# Patient Record
Sex: Male | Born: 1960 | Race: White | Hispanic: No | Marital: Single | Smoking: Former smoker
Health system: Southern US, Community
[De-identification: ages and names within clinical notes are randomized; demographics above are authoritative.]

## PROBLEM LIST (undated history)

## (undated) DIAGNOSIS — R768 Other specified abnormal immunological findings in serum: Secondary | ICD-10-CM

## (undated) DIAGNOSIS — K5900 Constipation, unspecified: Secondary | ICD-10-CM

## (undated) DIAGNOSIS — I10 Essential (primary) hypertension: Secondary | ICD-10-CM

## (undated) DIAGNOSIS — Z87442 Personal history of urinary calculi: Secondary | ICD-10-CM

## (undated) DIAGNOSIS — K219 Gastro-esophageal reflux disease without esophagitis: Secondary | ICD-10-CM

## (undated) DIAGNOSIS — F209 Schizophrenia, unspecified: Secondary | ICD-10-CM

## (undated) HISTORY — DX: Other specified abnormal immunological findings in serum: R76.8

## (undated) HISTORY — PX: CHOLECYSTECTOMY: SHX55

## (undated) HISTORY — PX: KIDNEY STONE SURGERY: SHX686

---

## 2014-03-12 LAB — CBC
HCT: 46.1 % (ref 40.0–52.0)
HGB: 15.6 g/dL (ref 13.0–18.0)
MCH: 34 pg (ref 26.0–34.0)
MCHC: 33.8 g/dL (ref 32.0–36.0)
MCV: 101 fL — AB (ref 80–100)
Platelet: 220 10*3/uL (ref 150–440)
RBC: 4.59 10*6/uL (ref 4.40–5.90)
RDW: 12.6 % (ref 11.5–14.5)
WBC: 9.1 10*3/uL (ref 3.8–10.6)

## 2014-03-12 LAB — ETHANOL: Ethanol %: <0.003 % (ref 0.000–0.080)

## 2014-03-12 LAB — COMPREHENSIVE METABOLIC PANEL
ALBUMIN: 4.5 g/dL (ref 3.4–5.0)
ALK PHOS: 100 U/L
Anion Gap: 10 (ref 7–16)
BILIRUBIN TOTAL: 0.6 mg/dL (ref 0.2–1.0)
BUN: 11 mg/dL (ref 7–18)
CREATININE: 1.2 mg/dL (ref 0.60–1.30)
Calcium, Total: 9.5 mg/dL (ref 8.5–10.1)
Chloride: 106 mmol/L (ref 98–107)
Co2: 24 mmol/L (ref 21–32)
EGFR (Non-African Amer.): >60
GLUCOSE: 111 mg/dL — AB (ref 65–99)
OSMOLALITY: 279 (ref 275–301)
POTASSIUM: 4.1 mmol/L (ref 3.5–5.1)
SGOT(AST): 17 U/L (ref 15–37)
SGPT (ALT): 19 U/L (ref 12–78)
SODIUM: 140 mmol/L (ref 136–145)
Total Protein: 8.6 g/dL — ABNORMAL HIGH (ref 6.4–8.2)

## 2014-03-12 LAB — ACETAMINOPHEN LEVEL: Acetaminophen: <2 ug/mL

## 2014-03-12 LAB — SALICYLATE LEVEL: Salicylates, Serum: <1.7 mg/dL

## 2014-03-13 ENCOUNTER — Inpatient Hospital Stay: Payer: Self-pay | Admitting: Psychiatry

## 2014-03-13 LAB — DRUG SCREEN, URINE

## 2014-03-13 LAB — URINALYSIS, COMPLETE
BLOOD: NEGATIVE
Bilirubin,UR: NEGATIVE
GLUCOSE, UR: NEGATIVE mg/dL (ref 0–75)
LEUKOCYTE ESTERASE: NEGATIVE
NITRITE: NEGATIVE
Ph: 5 (ref 4.5–8.0)
Protein: NEGATIVE
RBC,UR: 7 /HPF (ref 0–5)
SPECIFIC GRAVITY: 1.027 (ref 1.003–1.030)
Squamous Epithelial: NONE SEEN
WBC UR: 4 /HPF (ref 0–5)

## 2014-08-07 LAB — DRUG SCREEN, URINE
Amphetamines, Ur Screen: NEGATIVE (ref ?–1000)
BARBITURATES, UR SCREEN: NEGATIVE (ref ?–200)
Benzodiazepine, Ur Scrn: NEGATIVE (ref ?–200)
Cannabinoid 50 Ng, Ur ~~LOC~~: NEGATIVE (ref ?–50)
Cocaine Metabolite,Ur ~~LOC~~: NEGATIVE (ref ?–300)
MDMA (ECSTASY) UR SCREEN: NEGATIVE (ref ?–500)
Methadone, Ur Screen: NEGATIVE (ref ?–300)
OPIATE, UR SCREEN: NEGATIVE (ref ?–300)
PHENCYCLIDINE (PCP) UR S: NEGATIVE (ref ?–25)
TRICYCLIC, UR SCREEN: NEGATIVE (ref ?–1000)

## 2014-08-07 LAB — URINALYSIS, COMPLETE
BACTERIA: NONE SEEN
BILIRUBIN, UR: NEGATIVE
Glucose,UR: NEGATIVE mg/dL (ref 0–75)
KETONE: NEGATIVE
LEUKOCYTE ESTERASE: NEGATIVE
NITRITE: NEGATIVE
PROTEIN: NEGATIVE
Ph: 5 (ref 4.5–8.0)
RBC,UR: 2 /HPF (ref 0–5)
SQUAMOUS EPITHELIAL: NONE SEEN
Specific Gravity: 1.012 (ref 1.003–1.030)
WBC UR: 1 /HPF (ref 0–5)

## 2014-08-07 LAB — CBC
HCT: 40 % (ref 40.0–52.0)
HGB: 13.3 g/dL (ref 13.0–18.0)
MCH: 34.2 pg — ABNORMAL HIGH (ref 26.0–34.0)
MCHC: 33.2 g/dL (ref 32.0–36.0)
MCV: 103 fL — AB (ref 80–100)
PLATELETS: 193 10*3/uL (ref 150–440)
RBC: 3.88 10*6/uL — ABNORMAL LOW (ref 4.40–5.90)
RDW: 12.7 % (ref 11.5–14.5)
WBC: 7.9 10*3/uL (ref 3.8–10.6)

## 2014-08-07 LAB — COMPREHENSIVE METABOLIC PANEL
ALT: 24 U/L
Albumin: 3.9 g/dL (ref 3.4–5.0)
Alkaline Phosphatase: 126 U/L — ABNORMAL HIGH
Anion Gap: 7 (ref 7–16)
BILIRUBIN TOTAL: 0.3 mg/dL (ref 0.2–1.0)
BUN: 7 mg/dL (ref 7–18)
CALCIUM: 8.5 mg/dL (ref 8.5–10.1)
CO2: 29 mmol/L (ref 21–32)
Chloride: 104 mmol/L (ref 98–107)
Creatinine: 0.93 mg/dL (ref 0.60–1.30)
EGFR (Non-African Amer.): >60
Glucose: 106 mg/dL — ABNORMAL HIGH (ref 65–99)
Osmolality: 278 (ref 275–301)
POTASSIUM: 3.5 mmol/L (ref 3.5–5.1)
SGOT(AST): 21 U/L (ref 15–37)
Sodium: 140 mmol/L (ref 136–145)
TOTAL PROTEIN: 7.7 g/dL (ref 6.4–8.2)

## 2014-08-07 LAB — ACETAMINOPHEN LEVEL

## 2014-08-07 LAB — ETHANOL: Ethanol: <3 mg/dL

## 2014-08-07 LAB — SALICYLATE LEVEL: Salicylates, Serum: <1.7 mg/dL

## 2014-08-10 ENCOUNTER — Inpatient Hospital Stay: Payer: Self-pay | Admitting: Psychiatry

## 2014-08-10 LAB — CBC WITH DIFFERENTIAL/PLATELET
Basophil #: 0 10*3/uL (ref 0.0–0.1)
Basophil %: 0.5 %
EOS ABS: 0.3 10*3/uL (ref 0.0–0.7)
EOS PCT: 3 %
HCT: 37.2 % — ABNORMAL LOW (ref 40.0–52.0)
HGB: 12.6 g/dL — AB (ref 13.0–18.0)
LYMPHS PCT: 29.5 %
Lymphocyte #: 2.5 10*3/uL (ref 1.0–3.6)
MCH: 34.4 pg — ABNORMAL HIGH (ref 26.0–34.0)
MCHC: 33.8 g/dL (ref 32.0–36.0)
MCV: 102 fL — ABNORMAL HIGH (ref 80–100)
Monocyte #: 0.9 x10 3/mm (ref 0.2–1.0)
Monocyte %: 10.2 %
NEUTROS ABS: 4.9 10*3/uL (ref 1.4–6.5)
NEUTROS PCT: 56.8 %
Platelet: 174 10*3/uL (ref 150–440)
RBC: 3.65 10*6/uL — ABNORMAL LOW (ref 4.40–5.90)
RDW: 12.7 % (ref 11.5–14.5)
WBC: 8.6 10*3/uL (ref 3.8–10.6)

## 2014-08-10 LAB — LIPID PANEL
Cholesterol: 166 mg/dL
HDL Cholesterol: 35 mg/dL — ABNORMAL LOW
Ldl Cholesterol, Calc: 75 mg/dL
Triglycerides: 278 mg/dL — ABNORMAL HIGH
VLDL Cholesterol, Calc: 56 mg/dL — ABNORMAL HIGH

## 2014-08-10 LAB — HEMOGLOBIN A1C: HEMOGLOBIN A1C: 5.2 % (ref 4.2–6.3)

## 2014-08-10 LAB — TSH: Thyroid Stimulating Horm: 1.93 u[IU]/mL

## 2014-08-17 LAB — CBC WITH DIFFERENTIAL/PLATELET
Basophil #: 0.1 10*3/uL (ref 0.0–0.1)
Basophil %: 0.6 %
EOS ABS: 0 10*3/uL (ref 0.0–0.7)
Eosinophil %: 0.3 %
HCT: 40.9 % (ref 40.0–52.0)
HGB: 13.6 g/dL (ref 13.0–18.0)
LYMPHS PCT: 12.8 %
Lymphocyte #: 1.2 10*3/uL (ref 1.0–3.6)
MCH: 33.9 pg (ref 26.0–34.0)
MCHC: 33.2 g/dL (ref 32.0–36.0)
MCV: 102 fL — AB (ref 80–100)
MONOS PCT: 13.6 %
Monocyte #: 1.2 x10 3/mm — ABNORMAL HIGH (ref 0.2–1.0)
Neutrophil #: 6.6 10*3/uL — ABNORMAL HIGH (ref 1.4–6.5)
Neutrophil %: 72.7 %
Platelet: 206 10*3/uL (ref 150–440)
RBC: 4.01 10*6/uL — AB (ref 4.40–5.90)
RDW: 12.7 % (ref 11.5–14.5)
WBC: 9.1 10*3/uL (ref 3.8–10.6)

## 2014-08-24 LAB — DIFFERENTIAL
Basophil #: 0 10*3/uL (ref 0.0–0.1)
Basophil %: 0.4 %
Eosinophil #: 0 10*3/uL (ref 0.0–0.7)
Eosinophil %: 0.1 %
LYMPHS ABS: 1.1 10*3/uL (ref 1.0–3.6)
LYMPHS PCT: 10.1 %
MONOS PCT: 6.9 %
Monocyte #: 0.8 x10 3/mm (ref 0.2–1.0)
Neutrophil #: 9.4 10*3/uL — ABNORMAL HIGH (ref 1.4–6.5)
Neutrophil %: 82.5 %

## 2014-08-24 LAB — WBC: WBC: 11.4 10*3/uL — AB (ref 3.8–10.6)

## 2014-08-30 LAB — DIFFERENTIAL
BASOS ABS: 0 10*3/uL (ref 0.0–0.1)
BASOS PCT: 0.6 %
EOS ABS: 0.1 10*3/uL (ref 0.0–0.7)
Eosinophil %: 1.8 %
LYMPHS ABS: 2.2 10*3/uL (ref 1.0–3.6)
Lymphocyte %: 27.2 %
MONO ABS: 0.8 x10 3/mm (ref 0.2–1.0)
MONOS PCT: 10 %
NEUTROS PCT: 60.4 %
Neutrophil #: 4.9 10*3/uL (ref 1.4–6.5)

## 2014-08-30 LAB — WBC: WBC: 8.1 10*3/uL (ref 3.8–10.6)

## 2014-10-07 ENCOUNTER — Encounter (HOSPITAL_COMMUNITY): Payer: Self-pay | Admitting: *Deleted

## 2014-10-07 ENCOUNTER — Emergency Department (HOSPITAL_COMMUNITY)
Admission: EM | Admit: 2014-10-07 | Discharge: 2014-10-08 | Disposition: A | Discharge status: Home / Self Care | Payer: Medicaid Other | Attending: Emergency Medicine | Admitting: Emergency Medicine

## 2014-10-07 DIAGNOSIS — F209 Schizophrenia, unspecified: Secondary | ICD-10-CM | POA: Insufficient documentation

## 2014-10-07 DIAGNOSIS — Z9049 Acquired absence of other specified parts of digestive tract: Secondary | ICD-10-CM | POA: Diagnosis not present

## 2014-10-07 DIAGNOSIS — R112 Nausea with vomiting, unspecified: Secondary | ICD-10-CM | POA: Diagnosis present

## 2014-10-07 DIAGNOSIS — Z720 Tobacco use: Secondary | ICD-10-CM | POA: Diagnosis not present

## 2014-10-07 DIAGNOSIS — R1013 Epigastric pain: Secondary | ICD-10-CM

## 2014-10-07 DIAGNOSIS — E876 Hypokalemia: Secondary | ICD-10-CM

## 2014-10-07 DIAGNOSIS — E860 Dehydration: Secondary | ICD-10-CM

## 2014-10-07 DIAGNOSIS — Z79899 Other long term (current) drug therapy: Secondary | ICD-10-CM | POA: Insufficient documentation

## 2014-10-07 DIAGNOSIS — I951 Orthostatic hypotension: Secondary | ICD-10-CM

## 2014-10-07 HISTORY — DX: Schizophrenia, unspecified: F20.9

## 2014-10-07 LAB — COMPREHENSIVE METABOLIC PANEL
ALK PHOS: 106 U/L (ref 39–117)
ALT: 12 U/L (ref 0–53)
AST: 18 U/L (ref 0–37)
Albumin: 3.2 g/dL — ABNORMAL LOW (ref 3.5–5.2)
Anion gap: 5 (ref 5–15)
BUN: 10 mg/dL (ref 6–23)
CHLORIDE: 104 mmol/L (ref 96–112)
CO2: 29 mmol/L (ref 19–32)
Calcium: 8.5 mg/dL (ref 8.4–10.5)
Creatinine, Ser: 0.88 mg/dL (ref 0.50–1.35)
GFR calc Af Amer: >90 mL/min (ref >90)
GFR calc non Af Amer: >90 mL/min (ref >90)
GLUCOSE: 122 mg/dL — AB (ref 70–99)
Potassium: 3 mmol/L — ABNORMAL LOW (ref 3.5–5.1)
Sodium: 138 mmol/L (ref 135–145)
Total Bilirubin: 0.5 mg/dL (ref 0.3–1.2)
Total Protein: 6.6 g/dL (ref 6.0–8.3)

## 2014-10-07 LAB — URINE MICROSCOPIC-ADD ON

## 2014-10-07 LAB — CBC WITH DIFFERENTIAL/PLATELET
Basophils Absolute: 0 10*3/uL (ref 0.0–0.1)
Basophils Relative: 0 % (ref 0–1)
EOS ABS: 0.1 10*3/uL (ref 0.0–0.7)
Eosinophils Relative: 2 % (ref 0–5)
HCT: 37.3 % — ABNORMAL LOW (ref 39.0–52.0)
Hemoglobin: 12.5 g/dL — ABNORMAL LOW (ref 13.0–17.0)
LYMPHS ABS: 2 10*3/uL (ref 0.7–4.0)
Lymphocytes Relative: 26 % (ref 12–46)
MCH: 32.5 pg (ref 26.0–34.0)
MCHC: 33.5 g/dL (ref 30.0–36.0)
MCV: 96.9 fL (ref 78.0–100.0)
MONO ABS: 0.6 10*3/uL (ref 0.1–1.0)
Monocytes Relative: 7 % (ref 3–12)
NEUTROS PCT: 65 % (ref 43–77)
Neutro Abs: 5 10*3/uL (ref 1.7–7.7)
PLATELETS: 233 10*3/uL (ref 150–400)
RBC: 3.85 MIL/uL — ABNORMAL LOW (ref 4.22–5.81)
RDW: 11.9 % (ref 11.5–15.5)
WBC: 7.7 10*3/uL (ref 4.0–10.5)

## 2014-10-07 LAB — URINALYSIS, ROUTINE W REFLEX MICROSCOPIC
Bilirubin Urine: NEGATIVE
Glucose, UA: NEGATIVE mg/dL
Ketones, ur: NEGATIVE mg/dL
Leukocytes, UA: NEGATIVE
Nitrite: NEGATIVE
Protein, ur: NEGATIVE mg/dL
SPECIFIC GRAVITY, URINE: 1.02 (ref 1.005–1.030)
Urobilinogen, UA: 0.2 mg/dL (ref 0.0–1.0)
pH: 5.5 (ref 5.0–8.0)

## 2014-10-07 LAB — LIPASE, BLOOD: Lipase: 33 U/L (ref 11–59)

## 2014-10-07 MED ORDER — SODIUM CHLORIDE 0.9 % IV SOLN
1000.0000 mL | Freq: Once | INTRAVENOUS | Status: AC
  Administered 2014-10-07: 1000 mL via INTRAVENOUS

## 2014-10-07 MED ORDER — POTASSIUM CHLORIDE CRYS ER 20 MEQ PO TBCR
40.0000 meq | EXTENDED_RELEASE_TABLET | Freq: Once | ORAL | Status: AC
  Administered 2014-10-08: 40 meq via ORAL
  Filled 2014-10-07: qty 2

## 2014-10-07 MED ORDER — GI COCKTAIL ~~LOC~~
30.0000 mL | Freq: Once | ORAL | Status: AC
  Administered 2014-10-08: 30 mL via ORAL
  Filled 2014-10-07: qty 30

## 2014-10-07 MED ORDER — FAMOTIDINE 20 MG PO TABS
20.0000 mg | ORAL_TABLET | Freq: Once | ORAL | Status: AC
  Administered 2014-10-08: 20 mg via ORAL
  Filled 2014-10-07: qty 1

## 2014-10-07 NOTE — ED Provider Notes (Signed)
CSN: [id number]     Arrival date & time 10/07/14  1902 History  This chart was scribed for Danny Norrie, MD by [name], ED Scribe. This patient was seen in room APA12/APA12 and the patient's care was started at 11:31 PM.    Chief Complaint  Patient presents with  . Nausea   The history is provided by the patient, a caregiver and medical records. No language interpreter was used.    HPI Comments: Danny Greene is a 54 y.o. male resident at [facility] group home with history of schizophrenia who presents to the Emergency Department complaining of decreased appetite with onset 3 weeks ago, per staff member at group home. He reports associated weights loss of 15 pounds in the past month, nausea, vomiting a few times a week, and difficulty walking. He states he has not been eating due to nausea. He reports a burning abdominal pain, which he is experiencing mildly at this time and it is located in the epigastric area. He states he has to get used to being on his feet because he gets lightheaded and his knees start buckling. He states he is able to walk without a cane. The group home doctor saw him today and referred him here. He states he has had similar symptoms previously with decreased appetite and nausea and is unsure what prior diagnosis was. He denies recent hallucinations and states Clozaril is working well but thinks he is taking too much. He states he recently had increase in dose. He notes he talks to himself "all the time" but states he tries to keep it low so as not to bother others. Records indicate he began using Metoprolol 1 month ago for tachycardia but it was DC'd today. He states he smokes 1 pack or less every day. He denies diarrhea.  PCP PA Lauretta Grill  Past Medical History  Diagnosis Date  . Schizophrenia    Past Surgical History  Procedure Laterality Date  . Cholecystectomy     History reviewed. No pertinent family history. History  Substance Use Topics  . Smoking  status: Current Every Day Smoker  . Smokeless tobacco: Not on file  . Alcohol Use: No  lives in a group home  Living in this group home for about 6 months  Review of Systems  Constitutional: Positive for appetite change and unexpected weight change.  Gastrointestinal: Positive for nausea, vomiting and abdominal pain. Negative for diarrhea.  Neurological: Positive for weakness and light-headedness.  All other systems reviewed and are negative.     Allergies  Review of patient's allergies indicates no known allergies.  Home Medications   Prior to Admission medications   Medication Sig Start Date End Date Taking? Authorizing Provider  cloZAPine (CLOZARIL) 100 MG tablet Take 100 mg by mouth daily.   Yes Historical Provider, MD  clozapine (CLOZARIL) 200 MG tablet Take 400 mg by mouth at bedtime.   Yes Historical Provider, MD  senna (SENOKOT) 8.6 MG TABS tablet Take 2 tablets by mouth at bedtime.   Yes Historical Provider, MD   ED Triage Vitals  Enc Vitals Group     BP 10/07/14 1907 102/75 mmHg     Pulse Rate 10/07/14 1907 90     Resp 10/07/14 1907 18     Temp 10/07/14 1907 98.9 F (37.2 C)     Temp Source 10/07/14 1907 Oral     SpO2 10/07/14 1907 99 %     Weight 10/07/14 1907 153 lb 11.2 oz (  69.718 kg)     Height 10/07/14 1907 5\' 11"  (1.803 m)     Head Cir --      Peak Flow --      Pain Score --      Pain Loc --      Pain Edu? --      Excl. in [place]? --    Vital signs normal    Physical Exam  Constitutional: He is oriented to person, place, and time. He appears well-developed and well-nourished.  Non-toxic appearance. He does not appear ill. No distress.  Pt has a beard and long hair Thin Laying on his side as if uncomfortable  HENT:  Head: Normocephalic and atraumatic.  Right Ear: External ear normal.  Left Ear: External ear normal.  Nose: Nose normal. No mucosal edema or rhinorrhea.  Mouth/Throat: Oropharynx is clear and moist and mucous membranes are normal. No  dental abscesses or uvula swelling.  Eyes: Conjunctivae and EOM are normal. Pupils are equal, round, and reactive to light.  Neck: Normal range of motion and full passive range of motion without pain. Neck supple.  Cardiovascular: Normal rate, regular rhythm and normal heart sounds.  Exam reveals no gallop and no friction rub.   No murmur heard. Pulmonary/Chest: Effort normal and breath sounds normal. No respiratory distress. He has no wheezes. He has no rhonchi. He has no rales. He exhibits no tenderness and no crepitus.  Abdominal: Soft. Normal appearance and bowel sounds are normal. He exhibits no distension. There is tenderness (mild, epigastric tenderness). There is no rebound and no guarding.    Musculoskeletal: Normal range of motion. He exhibits no edema or tenderness.  Moves all extremities well.   Neurological: He is alert and oriented to person, place, and time. He has normal strength. No cranial nerve deficit.  Skin: Skin is warm, dry and intact. No rash noted. No erythema. No pallor.  Psychiatric: He has a normal mood and affect. His speech is normal and behavior is normal. His mood appears not anxious.  Nursing note and vitals reviewed.   ED Course  Procedures (including critical care time)  Medications  0.9 %  sodium chloride infusion (0 mLs Intravenous Stopped 10/08/14 0019)  famotidine (PEPCID) tablet 20 mg (20 mg Oral Given 10/08/14 0017)  gi cocktail (Maalox,Lidocaine,Donnatal) (30 mLs Oral Given 10/08/14 0017)  potassium chloride SA (K-DUR,KLOR-CON) CR tablet 40 mEq (40 mEq Oral Given 10/08/14 0017)  sodium chloride 0.9 % bolus 1,000 mL (0 mLs Intravenous Stopped 10/08/14 0155)  sodium chloride 0.9 % bolus 1,000 mL (1,000 mLs Intravenous New Bag/Given 10/08/14 0154)     DIAGNOSTIC STUDIES: Oxygen Saturation is 100% on room air, normal by my interpretation.    COORDINATION OF CARE: 11:41 PM Discussed treatment plan with patient at beside, the patient agrees with the plan and  has no further questions at this time. Patient was started on potassium supplementation.  23:59 Orthostatic Vital Signs LB  Orthostatic Lying  - BP- Lying: 113/77 mmHg ; Pulse- Lying: 76  Orthostatic Sitting - BP- Sitting: 110/75 mmHg ; Pulse- Sitting: 86  Orthostatic Standing at 0 minutes - BP- Standing at 0 minutes: 90/63 mmHg ; Pulse- Standing at 0 minutes: 94   Patient has significant drop of his blood pressure to 90 on standing. This may explain his feeling of lightheadedness when he stands up. He was given 1 L of IV fluids.  01:31:26 Orthostatic Vital Signs RH  Orthostatic Lying  - BP- Lying: 109/84 mmHg ; Pulse-  Lying: 90  Orthostatic Sitting - BP- Sitting: 99/76 mmHg ; Pulse- Sitting: 91  Orthostatic Standing at 0 minutes - BP- Standing at 0 minutes: 88/57 mmHg ; Pulse- Standing at 0 minutes: 90   Repeat orthostatics done after his first liter of IV fluid shows he still has significant orthostasis. He was given a second liter of IV fluid.  At time of discharge and went to see the patient. He has had some urinary output. He states he's feeling better he still has some mild epigastric discomfort. Patient is adamant however he is not going to have a CT scan done tonight. Unfortunately there is no reason to do the scan against his will because the patient has capacity to make that decision tonight. He is not actively hallucinating. He is alert and oriented.   Labs Review Results for orders placed or performed during the hospital encounter of 10/07/14  CBC with Differential  Result Value Ref Range   WBC 7.7 4.0 - 10.5 K/uL   RBC 3.85 (L) 4.22 - 5.81 MIL/uL   Hemoglobin 12.5 (L) 13.0 - 17.0 g/dL   HCT 37.3 (L) 39.0 - 52.0 %   MCV 96.9 78.0 - 100.0 fL   MCH 32.5 26.0 - 34.0 pg   MCHC 33.5 30.0 - 36.0 g/dL   RDW 11.9 11.5 - 15.5 %   Platelets 233 150 - 400 K/uL   Neutrophils Relative % 65 43 - 77 %   Neutro Abs 5.0 1.7 - 7.7 K/uL   Lymphocytes Relative 26 12 - 46 %   Lymphs Abs 2.0  0.7 - 4.0 K/uL   Monocytes Relative 7 3 - 12 %   Monocytes Absolute 0.6 0.1 - 1.0 K/uL   Eosinophils Relative 2 0 - 5 %   Eosinophils Absolute 0.1 0.0 - 0.7 K/uL   Basophils Relative 0 0 - 1 %   Basophils Absolute 0.0 0.0 - 0.1 K/uL  Comprehensive metabolic panel  Result Value Ref Range   Sodium 138 135 - 145 mmol/L   Potassium 3.0 (L) 3.5 - 5.1 mmol/L   Chloride 104 96 - 112 mmol/L   CO2 29 19 - 32 mmol/L   Glucose, Bld 122 (H) 70 - 99 mg/dL   BUN 10 6 - 23 mg/dL   Creatinine, Ser 0.88 0.50 - 1.35 mg/dL   Calcium 8.5 8.4 - 10.5 mg/dL   Total Protein 6.6 6.0 - 8.3 g/dL   Albumin 3.2 (L) 3.5 - 5.2 g/dL   AST 18 0 - 37 U/L   ALT 12 0 - 53 U/L   Alkaline Phosphatase 106 39 - 117 U/L   Total Bilirubin 0.5 0.3 - 1.2 mg/dL   GFR calc non Af Amer >90 >90 mL/min   GFR calc Af Amer >90 >90 mL/min   Anion gap 5 5 - 15  Lipase, blood  Result Value Ref Range   Lipase 33 11 - 59 U/L  Urinalysis, Routine w reflex microscopic  Result Value Ref Range   Color, Urine YELLOW YELLOW   APPearance CLEAR CLEAR   Specific Gravity, Urine 1.020 1.005 - 1.030   pH 5.5 5.0 - 8.0   Glucose, UA NEGATIVE NEGATIVE mg/dL   Hgb urine dipstick TRACE (A) NEGATIVE   Bilirubin Urine NEGATIVE NEGATIVE   Ketones, ur NEGATIVE NEGATIVE mg/dL   Protein, ur NEGATIVE NEGATIVE mg/dL   Urobilinogen, UA 0.2 0.0 - 1.0 mg/dL   Nitrite NEGATIVE NEGATIVE   Leukocytes, UA NEGATIVE NEGATIVE  Urine microscopic-add on  Result Value Ref Range   Squamous Epithelial / LPF RARE RARE   WBC, UA 0-2 <3 WBC/hpf   RBC / HPF 0-2 <3 RBC/hpf   Bacteria, UA RARE RARE   Laboratory interpretation all normal except except for hypokalemia     Imaging Review No results found.   EKG Interpretation None      MDM   Final diagnoses:  Dehydration  Hypokalemia  Orthostasis  Epigastric abdominal pain    New Prescriptions   OMEPRAZOLE (PRILOSEC) 20 MG CAPSULE    Take 1 po BID x 2 weeks then once a day   ONDANSETRON (ZOFRAN)  4 MG TABLET    Take 1 tablet (4 mg total) by mouth every 8 (eight) hours as needed.   POTASSIUM CHLORIDE SA (K-DUR,KLOR-CON) 20 MEQ TABLET    Take 1 tablet (20 mEq total) by mouth 2 (two) times daily.    Plan discharge  Rolland Porter, MD, FACEP    I personally performed the services described in this documentation, which was scribed in my presence. The recorded information has been reviewed and considered.  Rolland Porter, MD, FACEP   Danny Norrie, MD 10/08/14 [phone]

## 2014-10-07 NOTE — ED Notes (Addendum)
Pt from [facility] group home,  15 lb wt loss, decreased po intake,  Nausea,No pain. Says he feels dizzy at times

## 2014-10-08 MED ORDER — SODIUM CHLORIDE 0.9 % IV BOLUS (SEPSIS)
1000.0000 mL | Freq: Once | INTRAVENOUS | Status: AC
  Administered 2014-10-08: 1000 mL via INTRAVENOUS

## 2014-10-08 MED ORDER — POTASSIUM CHLORIDE CRYS ER 20 MEQ PO TBCR: 20.0000 meq | EXTENDED_RELEASE_TABLET | Freq: Two times a day (BID) | ORAL | Status: DC

## 2014-10-08 MED ORDER — OMEPRAZOLE 20 MG PO CPDR: DELAYED_RELEASE_CAPSULE | ORAL | Status: DC

## 2014-10-08 MED ORDER — ONDANSETRON HCL 4 MG PO TABS: 4.0000 mg | ORAL_TABLET | Freq: Three times a day (TID) | ORAL | Status: DC | PRN

## 2014-10-08 NOTE — Discharge Instructions (Signed)
Take the prilosec for the burning discomfort in your stomach. Use the zofran for nausea or vomiting. Try to drink more fluids so your lightheadedness will improve. If your abdominal pain gets worse you need to reconsider getting the CT scan of your abdomen or follow up with the gastroenterologist (stomach specialist) about your nausea and abdominal burning.

## 2014-10-08 NOTE — ED Notes (Addendum)
Pt is paranoid schizophrenic, remains resistant to therapies. Refuses some treatments offered at times, then will change his mind. consistent with disease process.

## 2014-10-15 ENCOUNTER — Inpatient Hospital Stay: Payer: Self-pay | Admitting: Psychiatry

## 2014-11-25 LAB — URINALYSIS, COMPLETE
BLOOD: NEGATIVE
Bilirubin,UR: NEGATIVE
GLUCOSE, UR: NEGATIVE mg/dL (ref 0–75)
Hyaline Cast: 2
LEUKOCYTE ESTERASE: NEGATIVE
NITRITE: NEGATIVE
PH: 5 (ref 4.5–8.0)
Protein: 30
RBC,UR: 2 /HPF (ref 0–5)
Specific Gravity: 1.028 (ref 1.003–1.030)
Squamous Epithelial: <1
WBC UR: 2 /HPF (ref 0–5)

## 2014-11-25 LAB — DRUG SCREEN, URINE
AMPHETAMINES, UR SCREEN: NEGATIVE
BENZODIAZEPINE, UR SCRN: NEGATIVE
Barbiturates, Ur Screen: NEGATIVE
Cannabinoid 50 Ng, Ur ~~LOC~~: NEGATIVE
Cocaine Metabolite,Ur ~~LOC~~: NEGATIVE
MDMA (ECSTASY) UR SCREEN: NEGATIVE
Methadone, Ur Screen: NEGATIVE
Opiate, Ur Screen: NEGATIVE
Phencyclidine (PCP) Ur S: NEGATIVE
Tricyclic, Ur Screen: NEGATIVE

## 2014-11-25 LAB — CBC
HCT: 40.5 % (ref 40.0–52.0)
HGB: 13.6 g/dL (ref 13.0–18.0)
MCH: 31.7 pg (ref 26.0–34.0)
MCHC: 33.5 g/dL (ref 32.0–36.0)
MCV: 95 fL (ref 80–100)
Platelet: 242 10*3/uL (ref 150–440)
RBC: 4.28 10*6/uL — AB (ref 4.40–5.90)
RDW: 13.6 % (ref 11.5–14.5)
WBC: 11.8 10*3/uL — ABNORMAL HIGH (ref 3.8–10.6)

## 2014-11-25 LAB — COMPREHENSIVE METABOLIC PANEL
ALBUMIN: 4.1 g/dL
ALK PHOS: 109 U/L
AST: 19 U/L
Anion Gap: 13 (ref 7–16)
BUN: 22 mg/dL — AB
Bilirubin,Total: 0.8 mg/dL
Calcium, Total: 9.7 mg/dL
Chloride: 91 mmol/L — ABNORMAL LOW
Co2: 32 mmol/L
Creatinine: 1.06 mg/dL
EGFR (African American): >60
Glucose: 127 mg/dL — ABNORMAL HIGH
Potassium: 3.3 mmol/L — ABNORMAL LOW
SGPT (ALT): 11 U/L — ABNORMAL LOW
Sodium: 136 mmol/L
Total Protein: 7.5 g/dL

## 2014-11-25 LAB — ETHANOL

## 2014-11-25 LAB — SALICYLATE LEVEL: Salicylates, Serum: <4 mg/dL

## 2014-11-25 LAB — ACETAMINOPHEN LEVEL

## 2014-11-26 ENCOUNTER — Inpatient Hospital Stay
Admit: 2014-11-26 | Disposition: A | Discharge status: Home / Self Care | Payer: Self-pay | Attending: Psychiatry | Admitting: Psychiatry

## 2014-11-26 LAB — DIFFERENTIAL
Basophil #: 0 10*3/uL (ref 0.0–0.1)
Basophil %: 0.2 %
EOS PCT: 0.1 %
Eosinophil #: 0 10*3/uL (ref 0.0–0.7)
LYMPHS ABS: 0.9 10*3/uL — AB (ref 1.0–3.6)
Lymphocyte %: 6.5 %
MONOS PCT: 6.8 %
Monocyte #: 1 x10 3/mm (ref 0.2–1.0)
NEUTROS ABS: 12.4 10*3/uL — AB (ref 1.4–6.5)
Neutrophil %: 86.4 %

## 2014-11-26 LAB — WBC: WBC: 14.4 10*3/uL — ABNORMAL HIGH (ref 3.8–10.6)

## 2014-12-03 LAB — DIFFERENTIAL
BASOS ABS: 0 10*3/uL (ref 0.0–0.1)
Basophil %: 0.6 %
EOS PCT: 2.2 %
Eosinophil #: 0.2 10*3/uL (ref 0.0–0.7)
LYMPHS ABS: 1.3 10*3/uL (ref 1.0–3.6)
Lymphocyte %: 18.6 %
MONO ABS: 0.7 x10 3/mm (ref 0.2–1.0)
Monocyte %: 9.1 %
Neutrophil #: 5 10*3/uL (ref 1.4–6.5)
Neutrophil %: 69.5 %

## 2014-12-03 LAB — WBC: WBC: 7.2 10*3/uL (ref 3.8–10.6)

## 2014-12-04 LAB — BASIC METABOLIC PANEL
Anion Gap: 7 (ref 7–16)
BUN: 14 mg/dL
Calcium, Total: 9.1 mg/dL
Chloride: 99 mmol/L — ABNORMAL LOW
Co2: 28 mmol/L
Creatinine: 0.96 mg/dL
EGFR (African American): >60
Glucose: 132 mg/dL — ABNORMAL HIGH
Potassium: 3.1 mmol/L — ABNORMAL LOW
Sodium: 134 mmol/L — ABNORMAL LOW

## 2014-12-04 LAB — AMMONIA: AMMONIA, PLASMA: 14 umol/L

## 2014-12-05 LAB — BASIC METABOLIC PANEL
Anion Gap: 10 (ref 7–16)
BUN: 13 mg/dL
CALCIUM: 8.9 mg/dL
CHLORIDE: 99 mmol/L — AB
CO2: 29 mmol/L
CREATININE: 0.83 mg/dL
Glucose: 139 mg/dL — ABNORMAL HIGH
Potassium: 3 mmol/L — ABNORMAL LOW
Sodium: 138 mmol/L

## 2014-12-09 LAB — DIFFERENTIAL
Basophil #: 0 10*3/uL (ref 0.0–0.1)
Basophil %: 0.7 %
Eosinophil #: 0.2 10*3/uL (ref 0.0–0.7)
Eosinophil %: 3.9 %
LYMPHS PCT: 22.5 %
Lymphocyte #: 1.4 10*3/uL (ref 1.0–3.6)
Monocyte #: 0.6 x10 3/mm (ref 0.2–1.0)
Monocyte %: 9.6 %
NEUTROS PCT: 63.3 %
Neutrophil #: 3.9 10*3/uL (ref 1.4–6.5)

## 2014-12-09 LAB — WBC: WBC: 6.2 10*3/uL (ref 3.8–10.6)

## 2014-12-24 ENCOUNTER — Emergency Department
Admit: 2014-12-24 | Disposition: A | Discharge status: Home / Self Care | Payer: Self-pay | Admitting: Emergency Medicine

## 2014-12-24 LAB — COMPREHENSIVE METABOLIC PANEL
ALBUMIN: 4.1 g/dL
ANION GAP: 6 — AB (ref 7–16)
Alkaline Phosphatase: 121 U/L
BILIRUBIN TOTAL: 0.3 mg/dL
BUN: 8 mg/dL
CHLORIDE: 110 mmol/L
CREATININE: 0.85 mg/dL
Calcium, Total: 9 mg/dL
Co2: 25 mmol/L
Glucose: 99 mg/dL
POTASSIUM: 3.8 mmol/L
SGOT(AST): 19 U/L
SGPT (ALT): 12 U/L — ABNORMAL LOW
Sodium: 141 mmol/L
Total Protein: 7.1 g/dL

## 2014-12-24 LAB — CBC
HCT: 37.1 % — ABNORMAL LOW (ref 40.0–52.0)
HGB: 12.6 g/dL — ABNORMAL LOW (ref 13.0–18.0)
MCH: 32.1 pg (ref 26.0–34.0)
MCHC: 34.1 g/dL (ref 32.0–36.0)
MCV: 94 fL (ref 80–100)
PLATELETS: 211 10*3/uL (ref 150–440)
RBC: 3.93 10*6/uL — AB (ref 4.40–5.90)
RDW: 13.6 % (ref 11.5–14.5)
WBC: 5.1 10*3/uL (ref 3.8–10.6)

## 2014-12-24 LAB — DRUG SCREEN, URINE
AMPHETAMINES, UR SCREEN: NEGATIVE
BARBITURATES, UR SCREEN: NEGATIVE
Benzodiazepine, Ur Scrn: NEGATIVE
CANNABINOID 50 NG, UR ~~LOC~~: NEGATIVE
Cocaine Metabolite,Ur ~~LOC~~: NEGATIVE
MDMA (Ecstasy)Ur Screen: NEGATIVE
Methadone, Ur Screen: NEGATIVE
OPIATE, UR SCREEN: NEGATIVE
PHENCYCLIDINE (PCP) UR S: NEGATIVE
TRICYCLIC, UR SCREEN: NEGATIVE

## 2014-12-24 LAB — URINALYSIS, COMPLETE
Bacteria: NONE SEEN
Bilirubin,UR: NEGATIVE
Blood: NEGATIVE
GLUCOSE, UR: NEGATIVE mg/dL (ref 0–75)
Ketone: NEGATIVE
Leukocyte Esterase: NEGATIVE
NITRITE: NEGATIVE
PROTEIN: NEGATIVE
Ph: 6 (ref 4.5–8.0)
Specific Gravity: 1.011 (ref 1.003–1.030)

## 2014-12-24 LAB — SALICYLATE LEVEL: Salicylates, Serum: <4 mg/dL

## 2014-12-24 LAB — ETHANOL

## 2014-12-24 LAB — ACETAMINOPHEN LEVEL

## 2014-12-25 LAB — DIFFERENTIAL
BASOS ABS: 0 10*3/uL (ref 0.0–0.1)
BASOS PCT: 0.8 %
EOS ABS: 0.2 10*3/uL (ref 0.0–0.7)
Eosinophil %: 3 %
LYMPHS ABS: 1.4 10*3/uL (ref 1.0–3.6)
Lymphocyte %: 26.6 %
Monocyte #: 0.4 x10 3/mm (ref 0.2–1.0)
Monocyte %: 7.4 %
NEUTROS ABS: 3.2 10*3/uL (ref 1.4–6.5)
Neutrophil %: 62.2 %

## 2014-12-25 NOTE — Discharge Summary (Signed)
PATIENT NAME:  Danny Greene, Danny Greene MR#:  [id number] DATE OF BIRTH:  1960/12/12  DATE OF ADMISSION:  03/13/2014 DATE OF DISCHARGE:  03/26/2014  HOSPITAL COURSE: See dictated history and physical for details of admission. This 54 year old man with a history of schizophrenia was admitted to the hospital after being picked up by law enforcement extremely disorganized, belligerent and agitated. Fortunately, he was cooperative with medication throughout his hospital stay. He has been treated with Zyprexa, currently at a dose of 15 mg twice a day. He has tolerated medication well. Mildly sedated, but otherwise minimal side effects. The patient has attended some groups and showed improved insight and no dangerous behavior. We were at a loss for discharge planning for some time, but have now located a group home that is willing to admit him. Hopefully, he will follow up with the [facility] in [place]. The patient was educated about illness and medication and agrees to stay on medicine. He continues to have psychotic symptoms, but his behavior is much better and he is able to carry on some lucid conversation.   MENTAL STATUS EXAMINATION AT DISCHARGE: Still slightly disheveled but improved over his admission. Eye contact intermittent. Psychomotor activity calm.  Affect blunted, but smiling.  Mood stated as all right. Thoughts are scattered and disorganized and paranoid, but he is able to keep it together for simple conversations. Denies suicidal or homicidal ideation. Denies current hallucinations. Judgment and insight, some chronic impairment. Intelligence is probably average of baseline from vocabulary. Alert and oriented x4.   DISCHARGE MEDICATIONS: Zyprexa 15 mg p.o. b.i.d.   LABORATORY RESULTS: Urinalysis was unremarkable. Chemistry panel included slightly elevated glucose on a nonfasting draw, total protein elevated at 8.6. Alcohol level negative. CBC unremarkable. The drug screen was all negative.  Acetaminophen and salicylates negative.   DISPOSITION: Discharge to a group home. Follow up with local mental health providers scheduled to be with the [facility].   DIAGNOSIS, PRINCIPAL AND PRIMARY:  AXIS I: Schizophrenia, undifferentiated.   SECONDARY DIAGNOSES: AXIS I: No further.  AXIS II: No diagnosis.  AXIS III: No diagnosis.  AXIS IV: Moderate to severe from his chronic illness.  AXIS V: Functioning at time of discharge 43.    ____________________________ Gonzella Lex, MD jtc:ds D: 03/26/2014 14:57:23 ET T: 03/26/2014 19:54:12 ET JOB#: [id number]  cc: Gonzella Lex, MD, <Dictator> Gonzella Lex MD ELECTRONICALLY SIGNED 04/14/2014 0:38

## 2014-12-25 NOTE — Consult Note (Signed)
PATIENT NAME:  Danny Greene, Danny Greene MR#:  [id number] DATE OF BIRTH:  Jan 31, 1961  DATE OF CONSULTATION:  03/12/2014  CONSULTING PHYSICIAN:  Gonzella Lex, MD  IDENTIFYING INFORMATION AND REASON FOR CONSULTATION:  A 54 year old man brought in by law enforcement after being found walking in the street and then becoming agitated, belligerent and bizarre. Information obtained from the patient and the chart. Consultation for assessment and management.   HISTORY OF PRESENT ILLNESS: Very limited information. I am told the police brought this gentleman in because they found him walking in the street. When they tried to give him a citation, he became agitated and started acting bizarre. They brought him to our Emergency Room in handcuffs. The patient is not willing to give me any information. He is extremely agitated and threatening. Speech is pressured. Affect is bizarre, posturing and clearly paranoid and psychotic. The patient will not tell us where he lives or give [place] any information about himself.   PAST PSYCHIATRIC HISTORY: The patient appears to be chronically mentally ill and appears to have familiarity with the mental health system and with medications, but he will not give [place] any back history, and we do not have any record of him at our hospital, at least under his current identifying information.   PAST MEDICAL HISTORY: Unknown.   SOCIAL HISTORY: We are told that he lives in a group home, but he will not tell us which one or his address right now.   SUBSTANCE ABUSE HISTORY: Unknown.   CURRENT MEDICATIONS: Unknown.   ALLERGIES: Unknown.   REVIEW OF SYSTEMS:  The patient will not answer any direct questions. Does not appear to be in any acute physical distress. Does not make any physical complaints.   MENTAL STATUS EXAMINATION:  A dirty and disheveled man, very uncooperative with the interview. Eye contact intense. Psychomotor activity agitated, pacing, making threatening gestures, waving his fists  around, takes fighting postures, apropos at nothing. Speech is loud, rapid and pressured. Thoughts are bizarre. He makes multiple statements about androids and other bizarre things that do not even connect together. Clearly paranoid. Unclear if he is responding to internal stimuli. He has an educated vocabulary, but otherwise, cognitive testing impossible.   VITAL SIGNS: They did manage to get a blood pressure 112/76, pulse 118, respirations 22, temperature 99.   LABORATORY RESULTS:  He did allow some blood draws and his chemistry panel is essentially normal. Alcohol level was negative.  The hematology panel was normal. He has not given [place] a urine. We do not have a drug screen yet. Acetaminophen and salicylates negative.   ASSESSMENT: A 54 year old gentleman who is acutely psychotic, agitated and bizarre. Differential diagnosis include schizophrenia, bipolar disorder and substance induced, as well as a, less likely, medical psychosis. The patient is agitated and dangerous right now. High risk of violence to others. Needs management in the hospital.   TREATMENT PLAN: Because of his posturing behavior and high risk of violence, he is not appropriate to come downstairs. He needs to stay in the Emergency Room where security is more readily available. The patient has already been given 5 mg of IM Haldol. I have put in orders for oral Zyprexa, as well as oral p.r.n. Ativan and p.r.n. IM Ativan and Geodon. The patient will be re-evaluated tomorrow.   DIAGNOSIS, PRINCIPAL AND PRIMARY:  AXIS I: Psychosis, not otherwise specified.   SECONDARY DIAGNOSES: AXIS I: No further.  AXIS II: Unknown.  AXIS III: Unknown.  AXIS IV: Unknown.  AXIS  V: 10.  ____________________________ Gonzella Lex, MD jtc:dmm D: 03/12/2014 22:17:03 ET T: 03/12/2014 22:28:56 ET JOB#: [id number]  cc: Gonzella Lex, MD, <Dictator> Gonzella Lex MD ELECTRONICALLY SIGNED 04/14/2014 0:37

## 2014-12-25 NOTE — H&P (Signed)
PATIENT NAME:  Danny Greene, Danny Greene MR#:  [id number] DATE OF BIRTH:  1961/02/14  DATE OF ADMISSION:  03/12/2014  IDENTIFYING INFORMATION AND CHIEF COMPLAINT: A 54 year old man who was brought in by the police after being found disruptive in public.   CHIEF COMPLAINT: "I know what you're about."   HISTORY OF PRESENT ILLNESS: Information obtained from the patient and the chart. The patient was brought to the Emergency Room by the police for being disruptive and bizarre in public. Apparently, they were trying to give him at ticket for blocking traffic and he became agitated and started acting psychotic, so they brought him into the hospital. The patient is not able to give [place] any lucid history at all. He was agitated and aggressive in the Emergency Room last night, making posturing violent gestures, although he did not actually hit anybody.   On interview today, he is much calmer, but he is still very disorganized. He is not really able to describe his recent history at all. He just talks about strange conspiracies that do not even make sense within themselves. He denies that he is using alcohol or drugs, says he is not taking any medicine.   PAST PSYCHIATRIC HISTORY: The patient knows Dr. Sammuel Cooper and can describe the location of the [place] and is familiar with the names of medications and clearly has been treated before, but he is not able to coherently tell us anything about his past treatment or the names of any medicines. He denies suicidal or homicidal behavior in the past.   PAST MEDICAL HISTORY: Denies any medical problems.   SOCIAL HISTORY: He gives me address that he says he lives at in town, but says that he stays by himself. When he says that, however, he goes on to describe how there are other people at the place that are trying to turn it into a group home. The whole thing makes no sense and I am not sure if he lives in a group home or really does live independently.   REVIEW OF  SYSTEMS: No physical complaints. No cardiac pain, GI or pulmonary complaints. Denies hallucinations. Denies suicidal or homicidal ideation.   CURRENT MEDICATIONS: None.   ALLERGIES: No known drug allergies.   MENTAL STATUS EXAMINATION: Disheveled, malodorous gentleman who looks his stated age. Today, he was at least superficially cooperative with the interview. Made good eye contact at times. Psychomotor activity still a little fidgety, but not at all threatening. He is able to sit still, for an extended period of time. Speech is quiet and superficially normal in tone, but very rambling. Thoughts are very disorganized and paranoid, cannot really make any sense of it. Denies hallucinations. Denies suicidal or homicidal ideation. He is alert and oriented to his current situation. Unable to do further cognitive testing.  Basic intelligence seems normal based on his vocabulary.   PHYSICAL EXAMINATION: GENERAL: Although he is disheveled and malodorous, he appears to be in no real acute physical distress.  SKIN: No skin lesions identified.  HEENT: Pupils equal and reactive. Face symmetric. Oral mucosa dry.  NECK AND BACK: Nontender to palpation. Normal gait. Full range of motion at all extremities. Strength and reflexes normal and symmetric throughout. Cranial nerves symmetric and normal.  LUNGS: Clear with no wheezes.  HEART: Regular rate and rhythm.  ABDOMEN: Soft, nontender, normal bowel sounds.  VITAL SIGNS: Currently include blood pressure 110/70, respirations 18, pulse 65, temperature 97.9.   SUBSTANCE ABUSE HISTORY: He denies that he uses any drugs  or alcohol or has had any problems with them in the past.   LABORATORY RESULTS: Chemistry panel just shows a slightly elevated glucose of no significance. The alcohol level was negative. The CBC is unremarkable. Urinalysis today unremarkable. Drug screen all negative.   ASSESSMENT: A 54 year old gentleman who is not able to give much history, and we  do not have any old records available. Based on examination, he almost certainly has schizophrenia. Does not appear to have another acute medical cause or to be intoxicated. Currently incapable of making reasonable decisions. Needs hospital level treatment.   TREATMENT PLAN: Since yesterday, I have had him on Zyprexa oral 10 mg twice a day and that seems to have made a lot of progress in just a day. I am going to go ahead and admit him to the psychiatry ward and continue Zyprexa 10 mg twice a day along with p.r.n. Ativan. We will work on trying to find out more information if possible from the community to locate where he lives and his past history. Try and engage him in groups and social work evaluation.   DIAGNOSIS, PRINCIPAL AND PRIMARY:  AXIS I: Schizophrenia, paranoid type.   SECONDARY DIAGNOSES: AXIS I: No further.  AXIS II: No diagnosis.  AXIS III: No diagnosis.  AXIS IV: Moderate to severe from lack of support.  AXIS V: Functioning at time of evaluation 25.    ____________________________ Gonzella Lex, MD jtc:ds D: 03/13/2014 19:48:05 ET T: 03/13/2014 20:24:32 ET JOB#: [id number]  cc: Gonzella Lex, MD, <Dictator> Gonzella Lex MD ELECTRONICALLY SIGNED 04/14/2014 0:37

## 2014-12-25 NOTE — Consult Note (Signed)
PATIENT NAME:  Danny Greene, Danny Greene MR#:  [id number] DATE OF BIRTH:  [birth year]  DATE OF CONSULTATION:  08/08/2014  REFERRING PHYSICIAN:   CONSULTING PHYSICIAN:  Jax Abdelrahman K. Anabia Weatherwax, MD  SUBJECTIVE: The patient was seen in consultation [facility] Emergency Room. 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 The patient is married for 15 years and currently separated and has been living with a girlfriend. The patient and girlfriend live in a house. The patient was brought to [facility] Emergency Room after he was evaluated by ACT team by Armen Pickup by his physician, Dr. Rosita Fire who recommended that he should come to the Emergency Room for help.  According to information obtained from his doctor, the patient had been noncompliant with medications, refusing to take his medication, which happens to be Zyprexa 10 mg at bedtime and had been very belligerent, angry, hostile, irritable, agitated and hard to redirect and she felt that he needs inpatient hospitalization for further stabilization.  CHIEF COMPLAINT: "I will not take my medications. It smothers me. You know all the side effects."   PAST PSYCHIATRIC HISTORY: H/O Inpt to psychiatry many years ago once before for a few days. No history of suicide attempt. Being followed by ACT team by Armen Pickup, by Dr. Rosita Fire. Last seen by her yesterday 08/07/14.  ALCOHOL AND DRUGS: The patient denies drinking alcohol. Denies IV drug abuse, but according to the information obtained from the chart, there is history of cannabis use disorder, but currently he denies the same.  Does admit smoking nicotine cigarettes at a rate of a pack a day for many years.  PAST MEDICAL HISTORY: Chronic hepatitis C, history of kidney stones, history of MRSA, with cellulitis, history of possible bronchial asthma.  MENTAL STATUS: The patient was very disheveled in appearance, poor grooming, malodorous. The patient is seen  lying in bed. Alert and oriented to place, person, and time. He knew the capital of [place], capital of [place], name of the current president. Denies feeling depressed. Denies feeling hopeless or helpless. Very irritable when questions are asked. Denies hearing voices. Denies seeing things. He is probably paranoid and suspicious, but he denies the same. Denies thought control. Denies having any grandiose ideas. He contracts for safety, but judgment and insight appears to be poor. Impulse control is poor.  IMPRESSION: Schizophrenia, chronic paranoid exacerbation due to noncompliance with the medications. History of cannibis use disorder. Nicotine dependence.   RECOMMENDATIONS: Recommend inpatient hospital psychiatry for a close observation after he is medically cleared and bed is available. We will start him on Zyprexa 10 mg p.o. at bedtime. Once the medication is in his system, he should start feeling better and probably will be less agitated and less paranoid and will continue to take his medications.   ____________________________ Wallace Cullens. Franchot Mimes, MD skc:sw D: 08/08/2014 14:17:00 ET T: 08/08/2014 16:53:48 ET JOB#: [id number]  cc: Arlyn Leak K. Franchot Mimes, MD, <Dictator> Dewain Penning MD ELECTRONICALLY SIGNED 08/14/2014 16:34

## 2014-12-25 NOTE — H&P (Signed)
PATIENT NAME:  Danny Greene, Danny Greene MR#:  [id number] DATE OF BIRTH:  09-Jun-1961  DATE OF ADMISSION:  08/10/2014  The patient is a 54 year old married and Caucasian male from [place] , [place]. The patient receives disability for a diagnosis of schizophrenia and is currently living in a group home.   CHIEF COMPLAINT: "I wasn't taking my medications because they smother me."   HISTORY OF PRESENT ILLNESS: Mr. Wageman presented to our Emergency Department on December 5. He was on was uncooperative with the assessment. The patient was referred for inpatient hospitalization by his ACT team, who had evaluated him earlier that day. The physician from [place] team reported to the intake assessment staff that the patient had been noncompliant with medications and had been refusing to take Zyprexa 10 mg at bedtime. Due to noncompliance, the patient had become belligerent, angry, hostile, irritable, easily agitated and difficult to redirect and they therefore recommended hospitalization.   At arrival, the patient was found to be displaying evident self-neglect as he had poor grooming, he was disheveled and had strong body odor. The patient today reports that he has not been taking the olanzapine for about a week because he feels this medication smothers him. He said that he has been suffocated and died at least 3 times before due to taking antipsychotics. The patient stated that his people are going to come and take [place] to court. He then started to make statements that were nonsensical such as that the judge from [place] was going to send [place] to jail. He reported that he has been communicating to his people through telepathy. When I asked him who his people were, the patient reported that I know who they are, that they are "the infinity girls." He denied today having auditory or visual hallucinations. He denied suicidality or homicidality. He denied other side effects from the olanzapine, other than  difficulties breathing. He denied problems with sleep, appetite, energy, or concentration as well.    SUBSTANCE ABUSE: The patient denied abusing alcohol or illicit substances. He does report smoking about 1 pack of cigarettes daily.   PAST PSYCHIATRIC HISTORY: The patient stated that he has been hospitalized multiple times. He was hospitalized in our facility back in July of 2015; he was diagnosed with schizophrenia and discharged on olanzapine 15 mg p.o. b.i.d. The patient receives treatment through the [facility].    CURRENT MEDICATIONS: Olanzapine 15 mg at bedtime. The patient says he also has been hospitalized at other facilities, including the [facility] in [place], [place]. He denies any history of self-injurious behaviors or suicidal attempts.   PAST MEDICAL HISTORY: The patient denies having any medical problems; however, per admission note, he has a history of hepatitis C, kidney stones, and possible bronchial asthma.   FAMILY HISTORY: The patient denies having any family history of mental illness, substance abuse or suicide.   SOCIAL HISTORY: The patient reports having a twelfth grade education. He states that he did not graduate on time, but denied repeating any grades. 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 The patient states he is married but denies having any children. He was unable to elaborate about the relationship with his wife. He denies any history of legal problems.   ALLERGIES: NO KNOWN DRUG ALLERGIES.   REVIEW OF SYSTEMS: Patient denies nausea, vomiting, or diarrhea. The rest of the 10 system, review of systems is negative.   MENTAL  STATUS EXAMINATION: The patient is a 54 year old thin Caucasian male who has poor grooming and hygiene. He has a beard. The patient behavior he was irritable at times during the assessment. His answers were vague and unreliable. His eye contact was within  normal range. Psychomotor activity was within normal range. His speech had a regular tone, volume, and rate. Thought process disorganized. Thought content positive for delusional beliefs, persecutory delusions, as he feels we are all devil worshipers that are trying to harm him. His mood is irritable. His affect is congruent. Insight and judgment are limited. Cognitive examination, he is alert and oriented in person, place, time, and situation.   PHYSICAL EXAMINATION: VITAL SIGNS: Blood pressure 99/63, respirations 18, pulse 71, temperature 97.9.  MUSCULOSKELETAL: The patient has normal gait, normal muscular tone, he does not appear to have any involuntary movements.   LABORATORY RESULTS: The patient has comprehensive metabolic panel from December 5, only showing mildly increased alkaline phosphatase at 126. The urine toxicology is negative. The CBC shows normal hemoglobin, normal hematocrit, normal WBCs, and normal platelets. UA is clear. Acetaminophen level and salicylate level are below detection limit.   DIAGNOSES:  AXIS I: Schizophrenia, tobacco use disorder.   AXIS II: Deferred.   AXIS III: Hepatitis C, rule out chronic obstructive pulmonary disease.   AXIS IV: non compliant with treatment.   PLAN: This patient will be admitted to the [place] [facility] at [facility], as he is currently disorganized and psychotic. This episode has been triggered by noncompliance with antipsychotics. The patient himself requested treatment with Clozaril as he said 1 of the actors of a kids TV show have recommended this treatment to him. I had agreed to start the treatment with Clozaril; however, I am concerned about his history of poor compliance. I will order the necessary laboratories for the initiation of Clozaril. For psychosis I will contact the ACT team, and discuss with them the possibility of starting the patient on Clozaril, tentatively I will start him today on 25 mg p.o. at  bedtime; if his B[place] is within normal limits. For insomnia, I will start the patient on Ambien 10 mg p.o. at bedtime. Laboratories I will order CBC along with an [place]. I also will order a baseline lipid panel and a baseline hemoglobin A1c. TSH was not checked at admission; therefore, I will check a level today. For nicotine withdrawal, the patient has declined from receiving any nicotine replacement therapies.   DISCHARGE DISPOSITION: Once stable, the patient will be discharged back to his group home and will continue to follow up with [facility] ACT team.    ____________________________ Hildred Priest, MD ahg:nt D: 08/10/2014 16:47:24 ET T: 08/10/2014 18:36:20 ET JOB#: [id number]  cc: Hildred Priest, MD, <Dictator> Rhodia Albright MD ELECTRONICALLY SIGNED 08/10/2014 21:58

## 2014-12-27 LAB — BASIC METABOLIC PANEL
Anion Gap: 8 (ref 7–16)
BUN: 12 mg/dL
Calcium, Total: 9.2 mg/dL
Chloride: 107 mmol/L
Co2: 26 mmol/L
Creatinine: 0.89 mg/dL
EGFR (African American): >60
EGFR (Non-African Amer.): >60
Glucose: 104 mg/dL — ABNORMAL HIGH
Potassium: 3.8 mmol/L
Sodium: 141 mmol/L

## 2014-12-27 LAB — CBC WITH DIFFERENTIAL/PLATELET
Basophil #: 0.1 10*3/uL (ref 0.0–0.1)
Basophil %: 0.9 %
Eosinophil #: 0.1 10*3/uL (ref 0.0–0.7)
Eosinophil %: 1.5 %
HCT: 37.6 % — ABNORMAL LOW (ref 40.0–52.0)
HGB: 12.5 g/dL — ABNORMAL LOW (ref 13.0–18.0)
Lymphocyte #: 0.9 10*3/uL — ABNORMAL LOW (ref 1.0–3.6)
Lymphocyte %: 11.7 %
MCH: 31.6 pg (ref 26.0–34.0)
MCHC: 33.2 g/dL (ref 32.0–36.0)
MCV: 95 fL (ref 80–100)
Monocyte #: 0.6 x10 3/mm (ref 0.2–1.0)
Monocyte %: 8.4 %
Neutrophil #: 5.8 10*3/uL (ref 1.4–6.5)
Neutrophil %: 77.5 %
Platelet: 188 10*3/uL (ref 150–440)
RBC: 3.96 10*6/uL — ABNORMAL LOW (ref 4.40–5.90)
RDW: 13.3 % (ref 11.5–14.5)
WBC: 7.5 10*3/uL (ref 3.8–10.6)

## 2015-01-02 NOTE — Consult Note (Signed)
Brief Consult Note: Diagnosis: L1 compression fracture.   Patient was seen by consultant.   Consult note dictated.   Recommend further assessment or treatment.   Orders entered.   Discussed with Attending MD.   Comments: Pt. with a minimally impacted L1 compression fracture without canal compromise or neurologic involvement.  Therefore, he can be managed non-surgically with an L-S corset and appropriate narcotic pain medication, then mobilization as symptoms permit. His symptoms should start to improve over the next 10-14 days. If he continues to have pain after 2-3 weeks, then he should follow-up with my partner, Dr. Rudene Christians, for consideration of a vertebroplasty, as he is the only one in the area who performs this procedure.  Electronic Signatures: Dorien Chihuahua (MD)  (Signed 01-Apr-16 18:47)  Authored: Brief Consult Note   Last Updated: 01-Apr-16 18:47 by Dorien Chihuahua (MD)

## 2015-01-02 NOTE — Consult Note (Signed)
Brief conversation with patient who was partly asleep revealed that he has complaints of periumbilical abd pain that was relieved with Protonix in the past.  I offered him to do an EGD to see what was going on in his stomach and he said that he might not wake up.  I then offered to him to take the Protonix and see if it would make him feel better.  He agreed to try this.  Will start it and see how he does.  Electronic Signatures: Manya Silvas (MD)  (Signed on 28-Mar-16 19:12)  Authored  Last Updated: 28-Mar-16 19:12 by Manya Silvas (MD)

## 2015-01-02 NOTE — Consult Note (Signed)
History of Present Illness:  History of Present Illness A 54 year old man with a history of schizophrenia who was placed on commitment by his ACT team last week. Pt seen in [place]. He stated that he is having back issues now. he stated that he was not eating well and throwing up at group home as he dislikes the food and he is not doing the same thing here, as no one is forcing the food on him. He stated that he has been given the medication regularly at the group home. He is not exhibiting any behavioral issues since he came to the ER. He remains complaint with meds. Has been calm and no acute paranoia, behavioral issues noted. Pt denied suicidal ideations /homicidal ideations or plans. He is concerned about losing his bed at the group home.   Target Symptoms:  Capacity Recognizes the presence of illlness  Understands consequences of treatment refusal   PAST MEDICAL & SURGICAL HX:  Significant Events:   Bipolar Disorder:    Schizophrenia:   CURRENT OUTPATIENT MEDICATIONS:  Home Medications: Medication Instructions Status  oxyCODONE 5 mg oral tablet 1 tab(s) orally every 6 hours for back pain for 10 days. Active  CBC WEEKLY CBC with ANC  once a week.  Next CBC due on April 14.Marland Kitchen  Results should be faxed to patient's pharmacy ASAP otherwise pharmaist will not refill the clozaril Active  psyllium 1 packet(s) orally 2 times a day - Constipation Active  senna oral tablet 2 tab(s) orally once a day (at bedtime) FOR CONSTIPATION Active  pantoprazole 40 mg oral delayed release tablet 1 tab(s) orally 2 times a day for acid reflux for the next 3 months. Active  fluvoxaMINE 100 mg oral tablet 1 tab(s) orally once a day (at bedtime) for depression. Active  cloZAPine 200 mg oral tablet 1 tab(s) orally once a day (at bedtime) for psychosis. Active   Mental Status Exam:  Speech Fluent   Mood Depressed   Affect Depressed   Thought Processes Linear/logical/goal directed   Orientation Place  Time   Person   Attention Awake  Oriented   Concentration Fair   Memory Intact   Fund of Knowledge Fair   Language Fair   Judgement Fair   Insight Fair   Reliabiity Fair   Review of Systems:  Review of Systems:  Medications/Allergies Reviewed Medications/Allergies reviewed   NURSING FLOWSHEETS:  Vital Signs/Nurse Notes-CM: ED Vital Sign Flow Sheet:   25-Apr-16 09:23  Temp Temperature 98.2  Temp Source oral  Pulse Pulse 118  Respirations Respirations 18  SBP SBP 90  DBP DBP 57  Pulse Ox % Pulse Ox % 96  Pulse Ox Source Source Room Air  Pain Scale (0-10) Pain Scale (0-10) Scale:0   Assessment & Diagnosis: Axis I: Schizophrenia, PT.   Axis II: none.  Treatment Plan: Pt will be released from IVC and d/c back to the group home.  He will continue his medications as prescribed.  Will discuss case with ACT team and plan for d/c.   I had a long discussion with Dr Rosita Fire from [place] team who was concerned about his discharge from ER. She reported that he remains delusional and non complaint with meds and has GI issues. she was pressing that pt should be followed by a GI specialist while in ED. she was concerned that he will stop taking his meds after d/c I discussed the case with Dr Waldon Reining and we both agreed that pt has been doing well since he came  to the ER and has not shown any agrressive behavior. He does not meet criteria for inpt  admission and can be d/c safely to ACTT team. I left message for Dr Rosita Fire at her cell phone..  Electronic Signatures: Jeronimo Norma (MD)  (Signed 25-Apr-16 13:18)  Authored: History of Present Illness, Target Symptoms, PAST MEDICAL & SURGICAL HX, CURRENT OUTPATIENT MEDICATIONS, Mental Status Exam, Review of Systems, NURSING FLOWSHEETS, Assessment & Diagnosis, Treatment Plan   Last Updated: 25-Apr-16 13:18 by Jeronimo Norma (MD)

## 2015-01-02 NOTE — Consult Note (Signed)
PATIENT NAME:  Danny Greene, Danny Greene MR#:  [id number] DATE OF BIRTH:  [birth year]  DATE OF CONSULTATION:  12/03/2014  REASON FOR CONSULTATION: I have been asked by Dr. Bary Leriche to evaluate this unfortunate man for mid-back pain. Apparently, he was brought to the Emergency Room 8 days ago from his group home. He has a history of schizophrenia. While in the Emergency Room, he fainted or somehow lost consciousness, causing him to collapse to the floor and landed heavily on his buttock and back. He was admitted for psychiatric reasons. During his hospitalization, he initially was complaining of some abdominal pain, prompting a GI consultation. More recently, he has been complaining of lower back pain, prompting x-rays of his lumbar spine to be obtained. These demonstrated an apparent mild wedge compression fracture of L1. A CT scan was obtained through this area, which demonstrated approximately 40% loss of vertebral body height centrally, although there does not appear to be as much loss of vertebral body height peripherally. Consequently, there is, at most, minimal retropulsion and no significant anterior wedge compression deformity. No other acute abnormalities were identified by CT scan. The patient denies any numbness or paresthesias to either lower extremity. He also complains of pain while rolling over in bed or trying to get up from a reclined position.   PHYSICAL EXAMINATION:  GENERAL: On examination, we have a pleasant, cooperative, middle-aged male resting comfortably in bed. He appears to be alert and appropriately responsive.  ORTHOPEDIC EXAMINATION: Limited to his lumbar spine and lower extremities. Skin inspection of his back is unremarkable. He does have moderate tenderness to percussion over the upper lumbar spine region. Examination of both lower extremities demonstrates that he is neurologically intact in both lower extremities with 4+/5/5 strength in all groups bilaterally. Sensation is intact to  light touch in all distributions bilaterally. He has a good capillary refill to both feet and no skin abnormalities about either lower extremity.   IMAGING: X-ray data includes lumbar spine films, which are available for review, as well as a CT scan, which is available for review. These findings are as described above.   IMPRESSION: L1 compression fracture.   PLAN: The treatment options are discussed with the patient, as well as with Dr. Bary Leriche. Based on his x-ray and CT findings, I do not feel that surgical intervention is warranted at this time. Therefore, we will initiate nonsurgical treatment to include application of a lumbosacral corset to wear whenever he is trying to get out of bed or to ambulate in the halls. In addition, appropriate narcotic medication is indicated to help with pain management. A reasonable option would be either hydrocodone or  Oxycodone 1 to 2 tablets p.o. every 6 hours p.r.n. pain. His symptoms should improve next 10 to 14 days. If they do not, he might be a candidate for a vertebroplasty. This is only performed by Dr. Rudene Christians, my partner, in this area. Therefore, I would suggest that arrangements be made for him to follow up in 2 to 3 weeks with Dr. Rudene Christians if necessary.   Thank you for asking me to participate in the care of this most unfortunate man. I will be happy to keep you abreast of his progress.   ____________________________ Lenna Sciara. Dorien Chihuahua, MD jjp:AT D: 12/03/2014 18:56:32 ET T: 12/03/2014 19:37:58 ET JOB#: [id number]  cc: Pascal Lux, MD, <Dictator> Pascal Lux MD ELECTRONICALLY SIGNED 12/07/2014 17:25

## 2015-01-02 NOTE — Consult Note (Signed)
PATIENT NAME:  Danny Greene, CRATE MR#:  [id number] DATE OF BIRTH:  08-May-1961  DATE OF CONSULTATION:  11/26/2014  REFERRING PHYSICIAN:   CONSULTING PHYSICIAN:  Gonzella Lex, MD  IDENTIFYING INFORMATION AND REASON FOR CONSULTATION: This is a 54 year old man with a history of schizophrenia, followed by the [place] [place] team. The ACT team has petitioned him based on a believe that he is showing psychosis and suicidal ideation.   CHIEF COMPLAINT: "I think I take too many medicines."   HISTORY OF PRESENT ILLNESS: Information from the patient and the chart and from my conversations with Dr. Rosita Fire of the ACT team, yesterday. ACT team has been concern that the patient has been acting increasingly bizarre, talking about delusions and hallucinations. Started throwing away a lot of his property. Made some statements that they felt indicated possible suicidality. The patient himself says his chief complaint is that his stomach feels like it is bloated and burning. He says that it has been a little better since his medicines were decreased, but still is present. He says he throws up frequently. Mood is described as being okay. Says he sleeps okay and that his weight has not been changing. He also mentions having orthostatic hypotension that happens frequently when he stands up quickly. The patient himself admits that he has hallucinations, although he is very guarded about it. He talks about how his spirit guide is guiding him into the future somehow, but he denies that there is any intent, plan, or thought of harming himself or harming anyone else. Denies any new recent stress that has been applied.   PAST PSYCHIATRIC HISTORY: Long history of schizophrenia. He was last admitted to the hospital here just last month and at that time, his clozapine dose was decreased to decrease side effects, leaving him with still an effective therapeutic level. The patient denies any history of suicide attempts in the past. When  he has been decompensated and sick, he has been quite paranoid and at times, agitated, but no known violence history.   PAST MEDICAL HISTORY: The patient has a history of side effects from his medication, including GI upset and probably orthostatic hypotension from his clozapine. Chronic constipation. Otherwise, no significant ongoing medical problems.   FAMILY HISTORY: Denies any family history of mental illness.   SOCIAL HISTORY: Lives in a group home. He is followed closely by an  ACT team. He says he does not have any contact of any significance with his family, regularly.   SUBSTANCE ABUSE HISTORY: Denies any alcohol drug abuse currently or significant problems with it in the past.   CURRENT MEDICATIONS: Metamucil 1 packet twice a day p.r.n. constipation, clozapine 50 mg in the morning, 50 mg in the afternoon, 150 mg at night. Metoprolol 25 mg twice a day.   ALLERGIES: No known drug allergies.   REVIEW OF SYSTEMS: Mood feels stable. Denies suicidal ideation. Admits to hallucinations, which he says are stable. Endorses nausea, GI upset, queasiness and orthostatic dizziness.   MENTAL STATUS EXAMINATION: Disheveled man who looks his stated age, cooperative with the interview. Good eye contact, normal psychomotor activity. Speech was quiet, but normal in rate and tone. Affect was constricted, but not bizarre. Mood was stated as being all right. Thoughts were simple, a little bit scattered. At times, shows evidence of paranoia and delusions. Admits to hallucinations. Denies suicidal or homicidal ideation. He is alert and oriented x 4. He can repeat 3 words immediately, only remembers 1 of them at 3  minutes. Judgment and insight chronically questionable. Baseline fund of knowledge appears normal.   LABORATORY RESULTS: Drug screen is all negative. Urinalysis 2+ red cells white cells. Salicylates, acetaminophen, and alcohol level are normal. Chemistry shows low potassium 3.3, elevated glucose 127,  white count 11.8.   VITAL SIGNS: Blood pressure 91/65, respirations 18, pulse 90, temperature 98.   ASSESSMENT: This is a 54 year old man with a history of schizophrenia who has been complaining of physical symptoms, including nausea, GI upset, and orthostasis. The ACT team has been very concerned that his psychosis is worse and that he may be showing behaviors consistent with suicidality. They have asked very strongly that he be hospitalized for safety.   TREATMENT PLAN: Admit to psychiatry. Suicide precautions in place, as well as fall precautions. Recheck clozapine level. Continue medicines as currently prescribed. No immediate other change to medicines.   DIAGNOSIS, PRINCIPAL AND PRIMARY:  AXIS I: Schizophrenia.   SECONDARY DIAGNOSES: AXIS I: No further.  AXIS II: Deferred.  AXIS III: History of high blood pressure, most recently orthostatic complaints.     ____________________________ Gonzella Lex, MD jtc:mw D: 11/26/2014 12:21:00 ET T: 11/26/2014 12:42:35 ET JOB#: [id number]  cc: Gonzella Lex, MD, <Dictator> Gonzella Lex MD ELECTRONICALLY SIGNED 12/07/2014 22:34

## 2015-01-02 NOTE — Consult Note (Addendum)
PATIENT NAME:  Danny Greene, Danny Greene MR#:  [id number] DATE OF BIRTH:  1961/07/20  DATE OF CONSULTATION:  12/24/2014  REFERRING PHYSICIAN:   CONSULTING PHYSICIAN:  Gonzella Lex, MD  IDENTIFYING INFORMATION AND REASON FOR CONSULTATION:  A 54 year old man with a history of schizophrenia sent here under commitment by his ACT team.   CHIEF COMPLAINT:  "I guess she wanted me to be in the hospital."   HISTORY OF PRESENT ILLNESS:  Information from the patient and the chart.  I also spoke with Dr. Rosita Fire of the [facility] ACT team who called me in advance to let me know the patient was coming to the hospital.  The ACT team has chosen to petition him because they are concerned that the patient may be still having daily vomiting which may be causing him health problems.  They are also concerned that he may be at least in part self inducing this vomiting. They have been following his blood counts, not only the normal ones for clozapine, but also his hemoglobin and hematocrit, and have noticed a gradual drift down of his hemoglobin and hematocrit and are concerned that there may be a GI bleed.  The patient had refused full workup of GI bleed when he was in the hospital last time.  They believed that he is danger to himself based on his refusal to do what they feel would be appropriate to take care of this vomiting and GI bleeding issue.  They note that the patient continues to be paranoid and to have psychotic symptoms.  The patient himself states that he vomits very infrequently anymore.  He says his abdominal discomfort is mostly resolved.  He says he has not thrown up today and he cannot remember how many days it has been since the last time he did so.  He admits that there were occasions when he was self inducing vomiting but said he only did that when he felt so sick to his stomach, he was certain he wanted to vomit and he was just helping it happen.  He says he has not done that in a long time.  The patient says  that his mood has been okay.  He denies that he is currently having any hallucinations.  He claims that he is not having any current psychotic symptoms.  Denies or minimizes any physical symptoms.   PAST PSYCHIATRIC HISTORY:  Long history of schizophrenia.  He has been on multiple antipsychotics in the past.  The patient is on clozapine interestingly. of what seems to be his own choice.  It is reported that he had requested to be on clozapine for reasons that could have been partially related to delusions.  I cannot get the patient to admit that to me today.  He appears to continue to have significant psychotic symptomatology with paranoia and some delusions as well as obvious negative symptoms despite the clozapine.  There is no past history of suicide attempts or violence identified, although he can be quite belligerent when he is very psychotic.   PAST MEDICAL HISTORY:  The patient has had episodes of orthostatic hypotension with a fall that resulted in a fracture to one of his vertebra last time he was here.  The exact cause of the orthostatic hypotension was I do not think ever fully determined.  He also was complaining of chronic nausea and vomiting whose cause was never finally determined.  There was speculation that it could be related to his clozapine.  The patient  does not think so.   SOCIAL HISTORY:  The patient lives in a group home.  The ACT team would like to have guardianship appointed of him that has not been completed yet.  Family do not seem to be very actively involved.   FAMILY HISTORY:  None identified.   SUBSTANCE ABUSE HISTORY:   At least recently he has not been abusing substances.   REVIEW OF SYSTEMS:  The patient minimizes or denies everything pretty much.  Says that he is having only minor nausea and none today.  Has been able to eat fine today.  Denies any pain. Not feeling dizzy.  No other physical complaints.  Denies suicidal or homicidal ideation and denies all  hallucinations.   MENTAL STATUS EXAMINATION:  Slightly disheveled gentleman who looks his stated age or older, cooperative with the interview for the most part.  Eye contact good.  Psychomotor activity calm.  Speech is normal in rate, tone, and volume.  Affect is a little bit constricted.  Mood stated as all right.  Thoughts are slow, not terribly disorganized.  He does not reveal any obvious delusions in conversation.  Denies hallucinations.  Denies suicidal or homicidal ideation. He is alert and oriented x 4.  Short and long-term memory intact.  Judgment and insight hard to gauge given that he may be intentionally avoiding talking about his psychotic symptoms.   LABORATORY RESULTS:  His hematocrit today is 37.1 with a hemoglobin of 12.6 which is actually slightly higher than they got it yesterday, but that was a different lab.  Chemistry panel,  alcohol negative, drug screen all negative.  Nothing remarkable on chemistries.   VITAL SIGNS:  Blood pressure is currently 99/71, respirations 18, pulse 88, temperature 98.3.   ASSESSMENT:  A 54 year old man with a history of schizophrenia.  ACT Team has petitioned him back to the hospital with concerns that he is not taking care of his health and is at risk of worsening health problems.  The patient is currently denying or minimizing all symptoms. Appears to be fairly physically stable.  On the other hand, I think that he has probably learned to avoid talking about is psychotic symptoms because he does not want to be in the hospital.   TREATMENT PLAN:  The ACT team is absolutely adamant that he needs to be in the hospital. We do not have any beds available at this time.  I am going to continue his medicines as they were ordered when he left the hospital last time which is clozapine 200 mg at night, Luvox 100 mg at night, Senokot daily, psyllium daily, and pantoprazole 40 mg twice a day.  The patient can be admitted to the hospital if a bed is available.  We can  check his CBC again after the weekend.   DIAGNOSIS, PRINCIPAL AND PRIMARY:   AXIS I:  Schizophrenia.   SECONDARY DIAGNOSES:  AXIS I:  No further.  AXIS II:  Deferred.  AXIS III:  Anemia of unknown etiology and hypotension of unknown etiology.      ____________________________ Gonzella Lex, MD [id number] D: 12/24/2014 18:35:16 ET T: 12/24/2014 18:48:05 ET JOB#: [id number]  cc: Gonzella Lex, MD, <Dictator> Gonzella Lex MD ELECTRONICALLY SIGNED 01/07/2015 19:31

## 2015-01-02 NOTE — Consult Note (Signed)
Although the patient is an unreliable historian due to his psy illness the fact that he reported improvement in abd symptoms indicates likely improvement with his PPI Protonix and I would recommend take this for at least 3 months.  Electronic Signatures: Manya Silvas (MD)  (Signed on 01-Apr-16 15:10)  Authored  Last Updated: 01-Apr-16 15:10 by Manya Silvas (MD)

## 2015-01-02 NOTE — Consult Note (Signed)
PATIENT NAME:  Danny Greene, Danny Greene MR#:  [id number] DATE OF BIRTH:  Jun 17, 1961  DATE OF CONSULTATION:  10/14/2014  REFERRING PHYSICIAN:   CONSULTING PHYSICIAN:  Gonzella Lex, MD  IDENTIFYING INFORMATION AND REASON FOR CONSULTATION: A 54 year old man with a history of schizophrenia referred to the hospital under involuntary commitment from his ACT team.   CHIEF COMPLAINT: "I'm on too much medicine."   HISTORY OF PRESENT ILLNESS: Information obtained from several conversations I have had with the ACT team as well as conversation with the patient and review of his chart. This is a 54 year old man with a history of schizophrenia with a long history of medicine noncompliance and resistance to treatment. He was in the hospital here in December 2015 during which time he was started on clozapine. He was discharged to a group home. According to the ACT team staff, he has decompensated since coming home. He stays withdrawn most of the time. Will not bathe at all unless actually physically coerced into it. Sleeps much of the time, although he is up at night at times. Not eating for extended periods of time and seems to have lost quite a bit of weight. Thinking appears to be bazaar and paranoid and generally shows poor self-care. Team is concerned about his worsening psychosis. They have been concerned about the possibility of medicine noncompliance as the patient has a past history of documented refusal of medication and "cheeking" to avoid medication. The patient himself says that he thinks his problem is that he is on too much medicine because "my legs keep collapsing on me". He denies any mood symptoms, denies any hallucinations and claims to have little understanding why he is brought into the hospital. He says he is compliant with all of his medicine.   PAST PSYCHIATRIC HISTORY: The patient has had a couple of admissions to our hospital in 2015. Both times very clearly psychotic. Very bizarre and paranoid. There  is no known history of suicide attempts. It is unclear if he has ever been physically violent, although he has been belligerent in the past. He tells me he does not think any medicine has ever been helpful for him. The ACT team is of the opinion that he needs to be on long-acting injectables because of his history of medicine refusal.   PAST MEDICAL HISTORY: The patient denies having any significant ongoing medical problems. Medication would suggest that he does have gastric reflux symptoms.   ALLERGIES: No known drug allergies.   MEDICATIONS: Clozapine 400 mg at bedtime, 100 mg in the morning; pantoprazole 40 mg in the morning.   SOCIAL HISTORY: The patient lives in a group home. He says that mostly he just stays around at home and watches TV all the time. Says that he does have a brother who he talks to occasionally, but does not have any other really active social life.   FAMILY HISTORY: Denies any family history of mental illness.   SUBSTANCE ABUSE HISTORY: He denies any current alcohol or drug abuse and will not admit to anything in the past.   REVIEW OF SYSTEMS: He admits that he has not been eating well. He says that he sleeps a great deal because he likes to sleep. Denies that he is having hallucinations. Denies feeling paranoid. Denies med noncompliance. Will not discuss much else.   MENTAL STATUS EXAMINATION: Very disheveled, malodorous gentleman who looks his stated age. Passive to uncooperative with the interview. Made no eye contact with me and actually turned over  in bed to face away from me for most of the interview. Psychomotor activity almost nonexistent. He is curled up in bed not moving for most of the interview. Speech is decreased in total amount. Long episodes of thought blocking. Thoughts marked by thought blocking and a paranoid tone. At times a little incoherent. Did not make any obviously bizarre statements. Denies suicidal or homicidal ideation. He denies any  hallucinations. He is alert and oriented to being in the hospital, although he gets the year wrong and has no idea what month it is. He can repeat 3 words immediately, but remembers none of them at three minutes. Judgment and insight poor. Intelligence probably impaired by illness.   DIAGNOSTIC DATA: Urinalysis unremarkable. Drug screen all negative. CBC shows normal indices. Chemistry panel: Slightly high alkaline phosphatase 159, low albumin 3.2. Alcohol negative. TSH normal.   I have ordered a EKG. I am not sure if it is done yet.   VITAL SIGNS: Blood pressure 103/58, respirations 20, pulse 107, temperature 97.5.   ASSESSMENT: This is a 54 year old man with a history of schizophrenia. Functioning poorly in the group home, not washing, not eating, acting paranoid, poor self-care. ACT team is concerned about his decompensation, believe he might be "cheeking" and is requesting that he be started on injectable medicine. The patient's insight is very poor. We do have documentation that he had a total clozapine combination level over 600 when he was discharged from the hospital, but do not have an updated clozapine level since then. The patient will be admitted to the hospital.   TREATMENT PLAN: Admit to the psychiatric ward. Psychoeducation and review of plan with the patient. Continue current medicines for now. Ordered an EKG to make sure we are not seeing any signs of cardiac toxicity. Primary team downstairs can work on addressing symptoms and treatment plan.   DIAGNOSIS, PRINCIPAL AND PRIMARY:  AXIS I: Schizophrenia.   SECONDARY DIAGNOSES: AXIS I: No further.  AXIS II: No diagnosis.  AXIS III: Gastric reflux symptoms.  ____________________________ Gonzella Lex, MD jtc:sb D: 10/14/2014 16:03:35 ET T: 10/14/2014 16:12:35 ET JOB#: [id number]  cc: Gonzella Lex, MD, <Dictator> Gonzella Lex MD ELECTRONICALLY SIGNED 10/14/2014 18:29

## 2015-01-11 NOTE — H&P (Signed)
Addemdum to H&P(cbc and ANC) were reviewedwas enrolled in the clozaril rem program----contacted the program with pt's infopharmacist was contacted spent on initial assessment>90 mof the time was spent in coordination of care   Electronic Signatures: Hildred Priest (MD)  (Signed on 09-Dec-15 14:51)  Authored  Last Updated: 09-Dec-15 14:51 by Hildred Priest (MD)

## 2015-01-11 NOTE — H&P (Signed)
PATIENT NAME:  Danny Greene, Danny Greene [id number] OF BIRTH:  17-Apr-1961 OF ADMISSION:  10/15/2014  The patient is a 54 year old married and Caucasian male from [place] , [place]. The patient receives disability for a diagnosis of schizophrenia and is currently living in a group home ([facility]) COMPLAINT:  "I'm not having hallucinations.". OF PRESENT ILLNESS: Mr. Danny Greene presented to our Emergency Department on 81/77/1165 via the police for c/o having nausea and vomiting; not caring for himself. Per Hoyle Sauer at [facility]: "he takes his meds and right after he takes them; he starts to vomiting; he throws up his meds; the psychiatrist--Dr. Rosita Fire came to see him and said that; he needed to come to the hospital."  ACT team voiced concerns that the patient may have been self-inducing vomiting as he has always been paranoid about taking medications and thinks all medicines are poison.  Per ACT team patient vomiting started since he was d/c from our unit back in December on Clozaril 500 mg total dose.  Also it has been reported pt has not showered since December.   I interviewed Danny Greene.  He was pleasant and cooperative with interview.  He states he has been taking the clozaril and states he prefers this medications over all the other ones he has tried because it has not cause muscle pain.  He thinks he may been on too much medication as he has been nauseated and has not eaten much.  He denies having depressed mood, SI, HI or A/VH.  Denies having issues with sleep, but does c/o poor appetite and poor energy.  He tells me he continues to communicate with "the Infinity girls" (group of pop stars) and one day they will come to save him from his many enemies.   c/o nausea and constipation.  He denied recent vomiting, diarrhea or abdominal pain.  No other complaints were voiced. ABUSE: The patient denied abusing alcohol or illicit substances. He does report smoking about 1  pack of cigarettes daily.  PSYCHIATRIC HISTORY: The patient stated that he has been hospitalized multiple times. He was hospitalized in our facility back in July of 2015; he was discharged on olanzapine 15 mg p.o. b.i.d.  He then was hospitalized again Dec 2015 due to noncompliance with olanzapine.  He was discharge on :   Medication Instructions  senna oral tablet  2 tab(s) orally once a day (at bedtime) FOR CONSTIPATION   cbc weekly  CBC with ANC once a week for 6 months.  Next CBC due on January 4thshould be faxed to patient's pharmacy ASAP otherwise pharmaist will not refill the clozaril   clozapine 200 mg oral tablet  2 tab(s) orally once a day (at bedtime) for psychosis   clozapine 100 mg oral tablet  1 tab(s) orally once a day in am for psychosis. MOUTH CHECKS   metoprolol succinate 100 mg oral tablet, extended release  1 tab(s) orally once a day for tachycardia   patient says he also has been hospitalized at other facilities, including the [facility] in [place], [place]. He denies any history of self-injurious behaviors or suicidal attempts. patient receives treatment through the ACT Team [facility].   MEDICAL HISTORY: The patient denies having any medical problems; however, per admission note, he has a history of hepatitis C, kidney stones, and possible bronchial asthma.  HISTORY: The patient denies having any family history of mental illness, substance abuse or suicide.  HISTORY: The patient reports having a  twelfth grade education. He states that he did not graduate on time, but denied repeating any grades. 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 He denies any history of legal problems.   ALLERGIES: NO KNOWN DRUG ALLERGIES.  OF SYSTEMS: Patient c/o nausea and constipation.  Denies vomiting, or diarrhea. The rest of the 10 system, review of systems is negative.  STATUS EXAMINATION: The patient is a 54 year old  thin Caucasian male who has poor grooming and hygiene. He has a long beard. The patient behavior was calm, pleasant and cooperative. His eye contact was within normal range. Psychomotor activity was mildly decreased. His speech had a regular tone, volume, and rate. Thought process linear. Thought content positive for delusional beliefs, persecutory delusions.  His mood is euthymic. His affect is congruent. Insight and judgment are fair. Cognitive examination, he is alert and oriented in person, place, time, and situation.  EXAMINATION:The patient has normal gait, normal muscular tone, he does not appear to have any involuntary movements.   Nursing and Ancillary Notes: **Vital Signs.:   12-Feb-16 12:56  .?Temperature Temperature (F): 98  .?Celsius: 36.6  .?Pulse Pulse: 126  .?Respirations Respirations: 16  .?Systolic BP Systolic BP: 093  .?Pulse Ox % Pulse Ox %: 98    Labs:  Lab Results: Thyroid:  10-Feb-16 17:50   Thyroid Stimulating Hormone 2.35 (0.45-4.50= International Unit) -----------------------patients have reference for TSH: - - - - - - - - - - first trimetser: 0.36 - 2.50 uIU/mL)  Hepatic:  10-Feb-16 17:50   Bilirubin, Total 0.3  Alkaline Phosphatase  159  SGPT (ALT) 19  SGOT (AST) 16  Total Protein, Serum 7.2  Albumin, Serum  3.2  General Ref:  10-Feb-16 17:50   Acetaminophen, Serum < 2.0 (10-30TOXIC: > 200 mcg/mL > 50 mcg/mL at 12 hr after ingestion > 300 mcg/mL at 4 hr after ingestion)  Salicylates, Serum < 1.7 (0.0-2.82.8-20.0 mg/dL>30.0 mg/dL)  11-Feb-16 14:11   Clozapine (Clozaril), Serum ========== TEST NAME ==========  ========= RESULTS =========  = REFERENCE RANGE =  (Clozaril), SerumSerum                [   596 ng/mL            ]        Not Estab.Serum             [   144 ng/mL           ]        Not Estab.            [   740 ng/mL            ]                  dosed with 400 mg clozapine daily for 4 weeks were mostto exhibit a therapeutic effect when the  sum of clozapine andconcentrations were at least 450 ng/mL.  No therapeutic,or toxic levels have been established.                                               Detection Limit = 20            [facility]            No: [id number]     [address],  [place], [place] [address]          Lindon Romp, MD         [phone] reported on 15 Oct 2014 at 02:21PM.  Cardiology:  11-Feb-16 12:40   Ventricular Rate 106  Atrial Rate 106  P-R Interval 160  QRS Duration 80  QT 340  QTc 451  P Axis 62  T Axis 56  ECG interpretation Sinus tachycardianormal ECGprevious ECGs availableby KOWALSKI, BRUCE ([id number]) on 10/15/2014 8:13:36 AM Serafina Royals  Routine Chem:  10-Feb-16 17:50   Lipase 168 (Result(s) reported on 13 Oct 2014 at 06:34PM.)  Glucose, Serum  113  BUN  5  Creatinine (comp) 0.98  Sodium, Serum 142  Potassium, Serum 4.2  Chloride, Serum 107  CO2, Serum 25  Calcium (Total), Serum 9.1  Osmolality (calc) 281  eGFR (African American) >60  eGFR (Non-African American) >60 (eGFR values <63mL/min/1.73 m2 may be an indication of chronicdisease (CKD).eGFR, using the MRDR Study equation, is useful in with stable renal function.eGFR calculation will not be reliable in acutely ill patientsserum creatinine is changing rapidly. It is not useful inon dialysis. The eGFR calculation may not be applicablepatients at the low and high extremes of body sizes, pregnantand vegetarians.)  Anion Gap 10  Ethanol, S. < 3 (Result(s) reported on 13 Oct 2014 at 06:47PM.)  Urine Drugs:  22-LNL-89 21:19   Tricyclic Antidepressant, Ur Qual (comp) NEGATIVE (Result(s) reported on 13 Oct 2014 at 06:34PM.)  Amphetamines, Urine Qual. NEGATIVE  MDMA, Urine Qual. NEGATIVE  Cocaine Metabolite, Urine Qual. NEGATIVE  Opiate, Urine qual NEGATIVE  Phencyclidine, Urine Qual. NEGATIVE  Cannabinoid, Urine Qual. NEGATIVE  Barbiturates, Urine Qual. NEGATIVE  Benzodiazepine, Urine Qual. NEGATIVE  (-----------------URINE DRUG SCREEN provides only a preliminary, unconfirmedtest result and should not be used for non-medical  Clinical consideration and professional judgment should be to any positive drug screen result due to possiblesubstances.  A more specific alternate chemical methodbe used in order to obtain a confirmed analytical result.  Gasspectrometry (GC/MS) is the preferredmethod.)  Methadone, Urine Qual. NEGATIVE  Routine UA:  10-Feb-16 17:51   Color (UA) Straw  Clarity (UA) Clear  Glucose (UA) Negative  Bilirubin (UA) Negative  Ketones (UA) Negative  Specific Gravity (UA) 1.005  Blood (UA) Negative  pH (UA) 6.0  Protein (UA) Negative  Nitrite (UA) Negative  Leukocyte Esterase (UA) Negative (Result(s) reported on 13 Oct 2014 at 06:43PM.)  RBC (UA) 1 /HPF  WBC (UA) 1 /HPF  Bacteria (UA) NONE SEEN  Epithelial Cells (UA) <1 /HPF  Mucous (UA) PRESENT (Result(s) reported on 13 Oct 2014 at 06:43PM.)  Routine Hem:  10-Feb-16 17:50   Neutrophil % 67.9  Lymphocyte % 21.3  Monocyte % 8.2  Eosinophil % 2.1  Basophil % 0.5  Neutrophil # 4.6  Lymphocyte # 1.4  Monocyte # 0.6  Eosinophil # 0.1  Basophil # 0.0  Reference Accession# [id number] (Result(s) reported on 13 Oct 2014 at 08:54PM.)  WBC (CBC) 6.7  RBC (CBC)  4.25  Hemoglobin (CBC) 13.8  Hematocrit (CBC) 41.4  Platelet Count (CBC) 262 (Result(s) reported on 13 Oct 2014 at 06:33PM.)  MCV 97  MCH 32.5  MCHC 33.3  RDW 12.4      I: Schizophrenia, tobacco use disorder. III: constipation, nausea, vomiting, weakness, clozaril induced tachycardia continue clozaril for now. Random Level of 740 total clozaril confirms patient has been compliant with treatment.  He would like to continue with clozaril and continues to refuse the use of long acting injectable.  Since nausea and vomiting started after clozaril was inititated I will lower the dose of clozaril to 100 mg po bid for 2 days.  Then will increase to 100 mg po q am and  200 mg po qhs.  (orders already in) induced tachycardia: pt was d/c on metoprolol XR but that medication was not on his home med list.  EKG shows sinus tachycardia and VS shows HR 126.  Will start metoprolol 12.5 mg po bid.  This may need to be increased tomorrow if HR is not improved and vomiting: no evidence of dehydration or electrolyte imbalance.  Will push fluids.  Order Phenergan 12.5 mg pot id.  Pantoprazole was increased to 40 mg po bid (caused by clozaril): prune juice with each meal, matamucil bid and senna qhs use disorder: continue nicotrol inhaler and nicotine patch 14 mg signs will be checked q 8 h fall precautions ordered will order CBC, CMP, CKMB, Troponin, BNP, Sedimentation Rate in order to r/o myocarditis once stable he will be d/c back to [facility] and he will continue to f/u with ACT team does not meet criteria for IVC, will change admission to voluntary.   Electronic Signatures: Hildred Priest (MD)  (Signed on 12-Feb-16 16:42)  Authored  Last Updated: 12-Feb-16 16:42 by Hildred Priest (MD)

## 2015-05-13 ENCOUNTER — Emergency Department
Admission: EM | Admit: 2015-05-13 | Discharge: 2015-05-13 | Disposition: A | Discharge status: Home / Self Care | Payer: Medicaid Other | Attending: Emergency Medicine | Admitting: Emergency Medicine

## 2015-05-13 ENCOUNTER — Emergency Department: Payer: Medicaid Other

## 2015-05-13 ENCOUNTER — Encounter: Payer: Self-pay | Admitting: Emergency Medicine

## 2015-05-13 DIAGNOSIS — Z720 Tobacco use: Secondary | ICD-10-CM | POA: Diagnosis not present

## 2015-05-13 DIAGNOSIS — E860 Dehydration: Secondary | ICD-10-CM | POA: Diagnosis not present

## 2015-05-13 DIAGNOSIS — E876 Hypokalemia: Secondary | ICD-10-CM

## 2015-05-13 DIAGNOSIS — Z79899 Other long term (current) drug therapy: Secondary | ICD-10-CM | POA: Diagnosis not present

## 2015-05-13 DIAGNOSIS — R52 Pain, unspecified: Secondary | ICD-10-CM | POA: Diagnosis present

## 2015-05-13 DIAGNOSIS — R05 Cough: Secondary | ICD-10-CM | POA: Insufficient documentation

## 2015-05-13 LAB — COMPREHENSIVE METABOLIC PANEL
ALBUMIN: 4.5 g/dL (ref 3.5–5.0)
ALT: 17 U/L (ref 17–63)
AST: 24 U/L (ref 15–41)
Alkaline Phosphatase: 120 U/L (ref 38–126)
Anion gap: 11 (ref 5–15)
BUN: 21 mg/dL — ABNORMAL HIGH (ref 6–20)
CHLORIDE: 89 mmol/L — AB (ref 101–111)
CO2: 36 mmol/L — AB (ref 22–32)
Calcium: 9.5 mg/dL (ref 8.9–10.3)
Creatinine, Ser: 1.08 mg/dL (ref 0.61–1.24)
GFR calc Af Amer: >60 mL/min (ref >60)
GFR calc non Af Amer: >60 mL/min (ref >60)
GLUCOSE: 96 mg/dL (ref 65–99)
POTASSIUM: 2.5 mmol/L — AB (ref 3.5–5.1)
SODIUM: 136 mmol/L (ref 135–145)
Total Bilirubin: 1 mg/dL (ref 0.3–1.2)
Total Protein: 7.8 g/dL (ref 6.5–8.1)

## 2015-05-13 LAB — URINALYSIS COMPLETE WITH MICROSCOPIC (ARMC ONLY)
Bilirubin Urine: NEGATIVE
Glucose, UA: NEGATIVE mg/dL
LEUKOCYTES UA: NEGATIVE
NITRITE: NEGATIVE
PH: 5 (ref 5.0–8.0)
Protein, ur: NEGATIVE mg/dL
SPECIFIC GRAVITY, URINE: 1.023 (ref 1.005–1.030)

## 2015-05-13 LAB — TROPONIN I: Troponin I: <0.03 ng/mL (ref <0.031)

## 2015-05-13 LAB — CBC WITH DIFFERENTIAL/PLATELET
BASOS PCT: 1 %
Basophils Absolute: 0.1 10*3/uL (ref 0–0.1)
EOS ABS: 0.1 10*3/uL (ref 0–0.7)
Eosinophils Relative: 1 %
HCT: 43.3 % (ref 40.0–52.0)
HEMOGLOBIN: 14.6 g/dL (ref 13.0–18.0)
Lymphocytes Relative: 15 %
Lymphs Abs: 1.6 10*3/uL (ref 1.0–3.6)
MCH: 31.3 pg (ref 26.0–34.0)
MCHC: 33.7 g/dL (ref 32.0–36.0)
MCV: 92.8 fL (ref 80.0–100.0)
MONO ABS: 1 10*3/uL (ref 0.2–1.0)
MONOS PCT: 9 %
NEUTROS PCT: 74 %
Neutro Abs: 8.4 10*3/uL — ABNORMAL HIGH (ref 1.4–6.5)
Platelets: 214 10*3/uL (ref 150–440)
RBC: 4.67 MIL/uL (ref 4.40–5.90)
RDW: 13.1 % (ref 11.5–14.5)
WBC: 11.1 10*3/uL — ABNORMAL HIGH (ref 3.8–10.6)

## 2015-05-13 MED ORDER — POTASSIUM CHLORIDE 40 MEQ/15ML (20%) PO SOLN: 15.0000 mL | Freq: Every day | ORAL | Status: DC

## 2015-05-13 MED ORDER — POTASSIUM CHLORIDE CRYS ER 20 MEQ PO TBCR: 40.0000 meq | EXTENDED_RELEASE_TABLET | Freq: Once | ORAL | Status: AC

## 2015-05-13 MED ORDER — POTASSIUM CHLORIDE CRYS ER 20 MEQ PO TBCR
EXTENDED_RELEASE_TABLET | ORAL | Status: AC
  Administered 2015-05-13: 40 meq
  Filled 2015-05-13: qty 2

## 2015-05-13 MED ORDER — SODIUM CHLORIDE 0.9 % IV BOLUS (SEPSIS)
1000.0000 mL | Freq: Once | INTRAVENOUS | Status: AC
  Administered 2015-05-13: 1000 mL via INTRAVENOUS

## 2015-05-13 NOTE — Discharge Instructions (Signed)

## 2015-05-13 NOTE — ED Notes (Signed)
Pt alert.  Iv fluids infusing.  Skin warm and dry.  nsr on monitor.

## 2015-05-13 NOTE — ED Notes (Signed)
Lab called with potassium of 2.5   Dr Dineen Kid aware.  meds given.

## 2015-05-13 NOTE — ED Provider Notes (Signed)
[facility] Emergency Department Provider Note  ____________________________________________  Time seen: Approximately 305 PM  I have reviewed the triage vital signs and the nursing notes.   HISTORY  Chief Complaint No chief complaint on file.    HPI Danny Greene is a 54 y.o. male with a history of schizophrenia and hepatitis C from a group home who is presenting today with low blood pressure and body aches. The patient denies any fever. Denies any nausea, vomiting. Says that he thinks he stood up too fast today and started feeling lightheaded. However, does not feel lightheaded at this time. Says that he has not been drinking much water lately. No burning when he urinates. Denies any nausea vomiting or diarrhea. Denies any dizziness. However does say he has experienced some lightheadedness when standing.He does say he has had a cough. Nonproductive. Denies any runny nose. No known sick contacts. Denies any history of IV drug use.   Past Medical History  Diagnosis Date  . Schizophrenia   . Hepatitis C     There are no active problems to display for this patient.   Past Surgical History  Procedure Laterality Date  . Cholecystectomy      Current Outpatient Rx  Name  Route  Sig  Dispense  Refill  . cloZAPine (CLOZARIL) 100 MG tablet   Oral   Take 100 mg by mouth daily.         . clozapine (CLOZARIL) 200 MG tablet   Oral   Take 400 mg by mouth at bedtime.         Marland Kitchen omeprazole (PRILOSEC) 20 MG capsule      Take 1 po BID x 2 weeks then once a day   60 capsule   0   . ondansetron (ZOFRAN) 4 MG tablet   Oral   Take 1 tablet (4 mg total) by mouth every 8 (eight) hours as needed.   12 tablet   0   . potassium chloride SA (K-DUR,KLOR-CON) 20 MEQ tablet   Oral   Take 1 tablet (20 mEq total) by mouth 2 (two) times daily.   8 tablet   0   . senna (SENOKOT) 8.6 MG TABS tablet   Oral   Take 2 tablets by mouth at bedtime.            Allergies Review of patient's allergies indicates no known allergies.  No family history on file.  Social History Social History  Substance Use Topics  . Smoking status: Current Every Day Smoker  . Smokeless tobacco: None  . Alcohol Use: No    Review of Systems Constitutional: No fever/chills Eyes: No visual changes. ENT: No sore throat. Cardiovascular: Denies chest pain. Respiratory: Denies shortness of breath. Gastrointestinal: No abdominal pain.  No nausea, no vomiting.  No diarrhea.  No constipation. Genitourinary: Negative for dysuria. Musculoskeletal: Negative for back pain. Skin: Negative for rash. Neurological: Negative for headaches, focal weakness or numbness.  10-point ROS otherwise negative.  ____________________________________________   PHYSICAL EXAM:  VITAL SIGNS: ED Triage Vitals  Enc Vitals Group     BP 05/13/15 1456 94/69 mmHg     Pulse Rate 05/13/15 1456 85     Resp 05/13/15 1500 15     Temp 05/13/15 1456 97.9 F (36.6 C)     Temp Source 05/13/15 1456 Oral     SpO2 05/13/15 1456 98 %     Weight 05/13/15 1456 146 lb (66.225 kg)     Height 05/13/15  1456 5\' 11"  (1.803 m)     Head Cir --      Peak Flow --      Pain Score 05/13/15 1456 0     Pain Loc --      Pain Edu? --      Excl. in [place]? --     Constitutional: Alert and oriented. Well appearing and in no acute distress. Eyes: Conjunctivae are normal. PERRL. EOMI. Head: Atraumatic. Nose: No congestion/rhinnorhea. Mouth/Throat: Mucous membranes are moist.  Oropharynx non-erythematous. Neck: No stridor.   Cardiovascular: Normal rate, regular rhythm. Grossly normal heart sounds without any audible murmur.  Good peripheral circulation. Respiratory: Normal respiratory effort.  No retractions. Lungs CTAB. Gastrointestinal: Soft and nontender. No distention. No abdominal bruits. No CVA tenderness. Musculoskeletal: No lower extremity tenderness nor edema.  No joint effusions. Neurologic:   Normal speech and language. No gross focal neurologic deficits are appreciated. No gait instability. Skin:  Skin is warm, dry and intact. No rash noted. No Osler's nodes or Janeway lesions on exam. Psychiatric: Mood and affect are normal. Speech and behavior are normal.  ____________________________________________   LABS (all labs ordered are listed, but only abnormal results are displayed)  Labs Reviewed  CBC WITH DIFFERENTIAL/PLATELET - Abnormal; Notable for the following:    WBC 11.1 (*)    Neutro Abs 8.4 (*)    All other components within normal limits  COMPREHENSIVE METABOLIC PANEL - Abnormal; Notable for the following:    Potassium 2.5 (*)    Chloride 89 (*)    CO2 36 (*)    BUN 21 (*)    All other components within normal limits  URINALYSIS COMPLETEWITH MICROSCOPIC ([facility] ONLY) - Abnormal; Notable for the following:    Color, Urine YELLOW (*)    APPearance CLEAR (*)    Ketones, ur TRACE (*)    Hgb urine dipstick 1+ (*)    Bacteria, UA MANY (*)    Squamous Epithelial / LPF 0-5 (*)    All other components within normal limits  TROPONIN I   ____________________________________________  EKG  ED ECG REPORT I, Doran Stabler, the attending physician, personally viewed and interpreted this ECG.   Date: 05/13/2015  EKG Time: 1451  Rate: 87  Rhythm: normal EKG, normal sinus rhythm  Axis: Normal axis  Intervals:none  ST&T Change: No ST segment elevations or depressions. No abnormal T-wave inversion.  ____________________________________________  RADIOLOGY  Negative chest x-ray. I personally reviewed this exam. ____________________________________________   PROCEDURES    ____________________________________________   INITIAL IMPRESSION / ASSESSMENT AND PLAN / ED COURSE  Pertinent labs & imaging results that were available during my care of the patient were reviewed by me and considered in my medical decision making (see chart for  details).  ----------------------------------------- 4:21 PM on 05/13/2015 -----------------------------------------  Blood Pressure still 90s. However, I was able to stand the patient without him having any feelings of lightheadedness or dizziness.  ----------------------------------------- 5:29 PM on 05/13/2015 -----------------------------------------  Patient with last blood pressure of 112/77 and heart rate in the 80s. Likely with mild dehydration. Counseled the patient to drink plenty of fluids at home. Possible viral illness causing these symptoms with mild dehydration. Discussed these findings with the patient. We'll be able to discharged home. ____________________________________________   FINAL CLINICAL IMPRESSION(S) / ED DIAGNOSES  Acute hypokalemia. Acute dehydration. Initial visit.    Orbie Pyo, MD 05/13/15 [phone]

## 2015-05-13 NOTE — ED Notes (Signed)
Resumed care from triacia rn.  Pt has nausea.  nsr on monitor. Iv in place.  Pt alert.  No acute distress

## 2015-05-13 NOTE — ED Notes (Signed)
Pt to [facility] ED via EMS, who was called by the personnel at the patient's group home due to patient having weakness and dizziness for the last several days.  Pt is poor historian, but complains of currently feeling "achy all over".  SBP in EMS truck was 90's-100.

## 2015-05-13 NOTE — ED Notes (Signed)
Pt eating dinner tray °

## 2015-05-13 NOTE — ED Notes (Signed)
Pt waiting on ride from group home

## 2015-05-25 ENCOUNTER — Emergency Department (HOSPITAL_COMMUNITY): Payer: Medicaid Other

## 2015-05-25 ENCOUNTER — Encounter (HOSPITAL_COMMUNITY): Payer: Self-pay | Admitting: *Deleted

## 2015-05-25 ENCOUNTER — Inpatient Hospital Stay (HOSPITAL_COMMUNITY)
Admission: EM | Admit: 2015-05-25 | Discharge: 2015-05-28 | DRG: 381 | Disposition: A | Discharge status: Home / Self Care | Payer: Medicaid Other | Attending: Internal Medicine | Admitting: Internal Medicine

## 2015-05-25 DIAGNOSIS — K209 Esophagitis, unspecified without bleeding: Secondary | ICD-10-CM | POA: Diagnosis present

## 2015-05-25 DIAGNOSIS — D62 Acute posthemorrhagic anemia: Secondary | ICD-10-CM | POA: Diagnosis present

## 2015-05-25 DIAGNOSIS — B182 Chronic viral hepatitis C: Secondary | ICD-10-CM | POA: Diagnosis present

## 2015-05-25 DIAGNOSIS — K2100 Gastro-esophageal reflux disease with esophagitis, without bleeding: Secondary | ICD-10-CM | POA: Insufficient documentation

## 2015-05-25 DIAGNOSIS — R109 Unspecified abdominal pain: Secondary | ICD-10-CM | POA: Diagnosis present

## 2015-05-25 DIAGNOSIS — K2211 Ulcer of esophagus with bleeding: Principal | ICD-10-CM | POA: Diagnosis present

## 2015-05-25 DIAGNOSIS — I10 Essential (primary) hypertension: Secondary | ICD-10-CM | POA: Diagnosis present

## 2015-05-25 DIAGNOSIS — Z720 Tobacco use: Secondary | ICD-10-CM | POA: Diagnosis present

## 2015-05-25 DIAGNOSIS — D72829 Elevated white blood cell count, unspecified: Secondary | ICD-10-CM | POA: Diagnosis present

## 2015-05-25 DIAGNOSIS — K449 Diaphragmatic hernia without obstruction or gangrene: Secondary | ICD-10-CM | POA: Diagnosis present

## 2015-05-25 DIAGNOSIS — R933 Abnormal findings on diagnostic imaging of other parts of digestive tract: Secondary | ICD-10-CM | POA: Diagnosis present

## 2015-05-25 DIAGNOSIS — R112 Nausea with vomiting, unspecified: Secondary | ICD-10-CM | POA: Diagnosis present

## 2015-05-25 DIAGNOSIS — F209 Schizophrenia, unspecified: Secondary | ICD-10-CM | POA: Diagnosis present

## 2015-05-25 DIAGNOSIS — F17210 Nicotine dependence, cigarettes, uncomplicated: Secondary | ICD-10-CM | POA: Diagnosis present

## 2015-05-25 DIAGNOSIS — R1013 Epigastric pain: Secondary | ICD-10-CM | POA: Diagnosis not present

## 2015-05-25 DIAGNOSIS — E876 Hypokalemia: Secondary | ICD-10-CM | POA: Diagnosis present

## 2015-05-25 DIAGNOSIS — R1033 Periumbilical pain: Secondary | ICD-10-CM | POA: Insufficient documentation

## 2015-05-25 HISTORY — DX: Essential (primary) hypertension: I10

## 2015-05-25 HISTORY — DX: Constipation, unspecified: K59.00

## 2015-05-25 HISTORY — DX: Gastro-esophageal reflux disease without esophagitis: K21.9

## 2015-05-25 LAB — URINALYSIS, ROUTINE W REFLEX MICROSCOPIC
Bilirubin Urine: NEGATIVE
Glucose, UA: NEGATIVE mg/dL
Ketones, ur: NEGATIVE mg/dL
Leukocytes, UA: NEGATIVE
Nitrite: NEGATIVE
PROTEIN: NEGATIVE mg/dL
Specific Gravity, Urine: <1.005 — ABNORMAL LOW (ref 1.005–1.030)
UROBILINOGEN UA: 0.2 mg/dL (ref 0.0–1.0)
pH: 8.5 — ABNORMAL HIGH (ref 5.0–8.0)

## 2015-05-25 LAB — URINE MICROSCOPIC-ADD ON

## 2015-05-25 LAB — COMPREHENSIVE METABOLIC PANEL
ALK PHOS: 105 U/L (ref 38–126)
ALT: 12 U/L — ABNORMAL LOW (ref 17–63)
AST: 20 U/L (ref 15–41)
Albumin: 3.8 g/dL (ref 3.5–5.0)
Anion gap: 12 (ref 5–15)
BUN: 10 mg/dL (ref 6–20)
CALCIUM: 8.9 mg/dL (ref 8.9–10.3)
CO2: 33 mmol/L — ABNORMAL HIGH (ref 22–32)
Chloride: 92 mmol/L — ABNORMAL LOW (ref 101–111)
Creatinine, Ser: 1.01 mg/dL (ref 0.61–1.24)
GFR calc Af Amer: >60 mL/min (ref >60)
GFR calc non Af Amer: >60 mL/min (ref >60)
Glucose, Bld: 142 mg/dL — ABNORMAL HIGH (ref 65–99)
Potassium: 2.4 mmol/L — CL (ref 3.5–5.1)
SODIUM: 137 mmol/L (ref 135–145)
TOTAL PROTEIN: 7.2 g/dL (ref 6.5–8.1)
Total Bilirubin: 0.6 mg/dL (ref 0.3–1.2)

## 2015-05-25 LAB — CBC WITH DIFFERENTIAL/PLATELET
BASOS ABS: 0 10*3/uL (ref 0.0–0.1)
BASOS PCT: 0 %
Eosinophils Absolute: 0 10*3/uL (ref 0.0–0.7)
Eosinophils Relative: 0 %
HCT: 39.8 % (ref 39.0–52.0)
HEMOGLOBIN: 13.9 g/dL (ref 13.0–17.0)
Lymphocytes Relative: 13 %
Lymphs Abs: 1.9 10*3/uL (ref 0.7–4.0)
MCH: 33.1 pg (ref 26.0–34.0)
MCHC: 34.9 g/dL (ref 30.0–36.0)
MCV: 94.8 fL (ref 78.0–100.0)
Monocytes Absolute: 1.4 10*3/uL — ABNORMAL HIGH (ref 0.1–1.0)
Monocytes Relative: 9 %
NEUTROS PCT: 78 %
Neutro Abs: 11.7 10*3/uL — ABNORMAL HIGH (ref 1.7–7.7)
Platelets: 266 10*3/uL (ref 150–400)
RBC: 4.2 MIL/uL — AB (ref 4.22–5.81)
RDW: 12.4 % (ref 11.5–15.5)
WBC: 15 10*3/uL — ABNORMAL HIGH (ref 4.0–10.5)

## 2015-05-25 LAB — LIPASE, BLOOD: LIPASE: 29 U/L (ref 22–51)

## 2015-05-25 MED ORDER — POTASSIUM CHLORIDE 10 MEQ/100ML IV SOLN
10.0000 meq | Freq: Once | INTRAVENOUS | Status: AC
  Administered 2015-05-25: 10 meq via INTRAVENOUS
  Filled 2015-05-25: qty 100

## 2015-05-25 MED ORDER — IOHEXOL 300 MG/ML  SOLN
25.0000 mL | Freq: Once | INTRAMUSCULAR | Status: AC | PRN
  Administered 2015-05-25: 25 mL via ORAL

## 2015-05-25 MED ORDER — SODIUM CHLORIDE 0.9 % IJ SOLN
INTRAMUSCULAR | Status: AC
  Filled 2015-05-25: qty 30

## 2015-05-25 MED ORDER — SODIUM CHLORIDE 0.9 % IJ SOLN
INTRAMUSCULAR | Status: AC
Start: 2015-05-25 — End: 2015-05-27
  Filled 2015-05-25: qty 1000

## 2015-05-25 MED ORDER — IOHEXOL 300 MG/ML  SOLN
100.0000 mL | Freq: Once | INTRAMUSCULAR | Status: AC | PRN
  Administered 2015-05-25: 100 mL via INTRAVENOUS

## 2015-05-25 MED ORDER — IOHEXOL 300 MG/ML  SOLN
60.0000 mL | Freq: Once | INTRAMUSCULAR | Status: AC | PRN
  Administered 2015-05-25: 60 mL via INTRAVENOUS

## 2015-05-25 MED ORDER — SODIUM CHLORIDE 0.9 % IV BOLUS (SEPSIS)
1000.0000 mL | Freq: Once | INTRAVENOUS | Status: AC
  Administered 2015-05-25: 1000 mL via INTRAVENOUS

## 2015-05-25 NOTE — H&P (Signed)
PCP:   Lauretta Grill, NP   Chief Complaint:  Abdominal pain  HPI: 54 year old male who   has a past medical history of Schizophrenia; Hepatitis C; Hypertension; GERD (gastroesophageal reflux disease); and Constipation. Today presents to the hospital with complaints of upper abdominal pain, nausea and vomiting for past 2 weeks. Patient has history of schizophrenia and is currently residing in a group home. In the ED CT abdomen pelvis revealed inflammation around the esophagus/hiatal hernia/mediastinal inflammation. Gen. surgery was consulted by the ED physician, Dr. Arnoldo Morale to follow the patient. He also admits to having intermittent chest pain, no fever. Denies shortness of breath  Allergies:  No Known Allergies    Past Medical History  Diagnosis Date  . Schizophrenia   . Hepatitis C   . Hypertension   . GERD (gastroesophageal reflux disease)   . Constipation     Past Surgical History  Procedure Laterality Date  . Cholecystectomy      Prior to Admission medications   Medication Sig Start Date End Date Taking? Authorizing Tallia Moehring  Cholecalciferol (VITAMIN D) 2000 UNITS tablet Take 2,000 Units by mouth daily.   Yes Historical Chidera Thivierge, MD  clozapine (CLOZARIL) 200 MG tablet Take 200 mg by mouth daily.   Yes Historical Tinsley Everman, MD  fluvoxaMINE (LUVOX) 100 MG tablet Take 100 mg by mouth at bedtime. sleep   Yes Historical Zephyr Sausedo, MD  ondansetron (ZOFRAN-ODT) 4 MG disintegrating tablet Place 8 mg under the tongue 2 (two) times daily.   Yes Historical Neng Albee, MD  senna (SENOKOT) 8.6 MG TABS tablet Take 2 tablets by mouth at bedtime.   Yes Historical Maylani Embree, MD  omeprazole (PRILOSEC) 20 MG capsule Take 1 po BID x 2 weeks then once a day Patient not taking: Reported on 05/25/2015 10/08/14   Rolland Porter, MD  ondansetron (ZOFRAN) 4 MG tablet Take 1 tablet (4 mg total) by mouth every 8 (eight) hours as needed. Patient not taking: Reported on 05/25/2015 10/08/14   Rolland Porter, MD    Potassium Chloride 40 MEQ/15ML (20%) SOLN Take 15 mLs by mouth at bedtime. Take dose before bed tonight 05/13/15   Orbie Pyo, MD  potassium chloride SA (K-DUR,KLOR-CON) 20 MEQ tablet Take 1 tablet (20 mEq total) by mouth 2 (two) times daily. Patient not taking: Reported on 05/25/2015 10/08/14   Rolland Porter, MD    Social History:  reports that he has been smoking.  He does not have any smokeless tobacco history on file. He reports that he does not drink alcohol or use illicit drugs.  History reviewed. No pertinent family history.  Filed Weights   05/25/15 2002  Weight: 64.547 kg (142 lb 4.8 oz)    All the positives are listed in BOLD  Review of Systems:  HEENT: Headache, blurred vision, runny nose, sore throat Neck: Hypothyroidism, hyperthyroidism,,lymphadenopathy Chest : Shortness of breath, history of COPD, Asthma Heart : Chest pain, history of coronary arterey disease GI:  Nausea, vomiting, diarrhea, constipation, GERD GU: Dysuria, urgency, frequency of urination, hematuria Neuro: Stroke, seizures, syncope Psych: Depression, anxiety, hallucinations   Physical Exam: Blood pressure 117/78, pulse 103, temperature 97.7 F (36.5 C), temperature source Oral, resp. rate 18, height 5\' 11"  (1.803 m), weight 64.547 kg (142 lb 4.8 oz), SpO2 99 %. Constitutional:   Patient is a well-developed and well-nourished male in no acute distress and cooperative with exam. Head: Normocephalic and atraumatic Mouth: Mucus membranes moist Eyes: PERRL, EOMI, conjunctivae normal Neck: Supple, No Thyromegaly Cardiovascular: RRR, S1 normal, S2  normal Pulmonary/Chest: CTAB, no wheezes, rales, or rhonchi Abdominal: Soft. Tenderness to palpation in the epigastric region, non-distended, bowel sounds are normal, no masses, organomegaly, or guarding present.  Neurological: A&O x3, Strength is normal and symmetric bilaterally, cranial nerve II-XII are grossly intact, no focal motor deficit, sensory  intact to light touch bilaterally.  Extremities : No Cyanosis, Clubbing or Edema  Labs on Admission:  Basic Metabolic Panel:  Recent Labs Lab 05/25/15 2028  NA 137  K 2.4*  CL 92*  CO2 33*  GLUCOSE 142*  BUN 10  CREATININE 1.01  CALCIUM 8.9   Liver Function Tests:  Recent Labs Lab 05/25/15 2028  AST 20  ALT 12*  ALKPHOS 105  BILITOT 0.6  PROT 7.2  ALBUMIN 3.8    Recent Labs Lab 05/25/15 2028  LIPASE 29   No results for input(s): AMMONIA in the last 168 hours. CBC:  Recent Labs Lab 05/25/15 2028  WBC 15.0*  NEUTROABS 11.7*  HGB 13.9  HCT 39.8  MCV 94.8  PLT 266   Radiological Exams on Admission: Ct Abdomen Pelvis W Contrast  05/25/2015   CLINICAL DATA:  Nausea and vomiting and epigastric pain tonight. Hepatitis-C.  EXAM: CT ABDOMEN AND PELVIS WITH CONTRAST  TECHNIQUE: Multidetector CT imaging of the abdomen and pelvis was performed using the standard protocol following bolus administration of intravenous contrast.  CONTRAST:  30mL OMNIPAQUE IOHEXOL 300 MG/ML SOLN, 166mL OMNIPAQUE IOHEXOL 300 MG/ML SOLN  COMPARISON:  None.  FINDINGS: Lower chest: There is a moderate hiatal hernia with edema in the mucosa of the hernia as well some free fluid in the middle mediastinum around the edematous hernia. This could represent severe acute esophagitis/mediastinitis. Slight focal atelectasis or scarring at the lung bases posteriorly. Heart size is normal.  Hepatobiliary: Normal.  Pancreas: Normal.  Spleen: Normal.  Adrenals/Urinary Tract: Normal.  Stomach/Bowel: Normal including the terminal ileum and appendix.  Vascular/Lymphatic: Normal.  No adenopathy.  Reproductive: Normal.  Other: No free air or free fluid.  Musculoskeletal: Old compression fracture of L1.  Otherwise normal.  IMPRESSION: Moderate hiatal hernia with free fluid around the hernia in the middle mediastinum. This could represent esophagitis/ mediastinitis and/or inflammation of the herniated gastric fundus. CT  scan of the chest recommended for further evaluation of the mediastinum.   Electronically Signed   By: Lorriane Shire M.D.   On: 05/25/2015 21:39      Assessment/Plan Active Problems:   Abdominal pain   Hypokalemia  Epigastric pain CT abdomen pelvis revealed possible Esophagitis/mediastinitis/inflammation of the herniated gastric fundus, CT chest has been ordered. Will follow the results Keep nothing by mouth, morphine when necessary for pain Gen. surgery was consulted by the ED physician Dr. Arnoldo Morale to follow GI also has been consulted  Hypokalemia Replace potassium, check BMP in a.m.  History of schizophrenia Will hold the psych meds at this time as patient is nothing by mouth  DVT prophylaxis Lovenox  Code status: Full code  Family discussion: No family at bedside   Time Spent on Admission: 60 minutes  [facility] Pager: [phone] 05/25/2015, 11:21 PM  If 7PM-7AM, please contact night-coverage  [email or web address]  Password [email or web address]

## 2015-05-25 NOTE — ED Notes (Signed)
Pt c/o abd pain, nausea, vomiting, diarrhea, has had problems recently, was seen at [facility] regional er, is scheduled to follow up with GI, but pain became worse,

## 2015-05-25 NOTE — ED Provider Notes (Signed)
CSN: [id number]     Arrival date & time 05/25/15  1949 History   First MD Initiated Contact with Patient 05/25/15 2013     Chief Complaint  Patient presents with  . Abdominal Pain     (Consider location/radiation/quality/duration/timing/severity/associated sxs/prior Treatment) HPI Comments: Patient is a 54 year old male with past medical history of schizophrenia and hepatitis C. He presents today for evaluation of worsening abdominal pain for the past 2 weeks. He has episodes of vomiting and reports a weight loss of approximately 15 pounds. He denies fevers or chills. He denies any bloody stools.  The patient is here with his caretaker. The patient is a resident of an assisted living facility and [facility].  Patient is a 54 y.o. male presenting with abdominal pain. The history is provided by the patient.  Abdominal Pain Pain location:  Generalized Pain quality: cramping   Pain radiates to:  Does not radiate Pain severity:  Moderate Onset quality:  Gradual Duration:  2 weeks Timing:  Intermittent Progression:  Worsening Chronicity:  New Relieved by:  Nothing Worsened by:  Nothing tried   Past Medical History  Diagnosis Date  . Schizophrenia   . Hepatitis C   . Hypertension   . GERD (gastroesophageal reflux disease)   . Constipation    Past Surgical History  Procedure Laterality Date  . Cholecystectomy     History reviewed. No pertinent family history. Social History  Substance Use Topics  . Smoking status: Current Every Day Smoker  . Smokeless tobacco: None  . Alcohol Use: No    Review of Systems  Gastrointestinal: Positive for abdominal pain.  All other systems reviewed and are negative.     Allergies  Review of patient's allergies indicates no known allergies.  Home Medications   Prior to Admission medications   Medication Sig Start Date End Date Taking? Authorizing Cherrelle Plante  Cholecalciferol (VITAMIN D) 2000 UNITS tablet Take 2,000 Units by mouth  daily.   Yes Historical Kourtni Stineman, MD  clozapine (CLOZARIL) 200 MG tablet Take 200 mg by mouth daily.   Yes Historical Wyllow Seigler, MD  fluvoxaMINE (LUVOX) 100 MG tablet Take 100 mg by mouth at bedtime. sleep   Yes Historical Miyanna Wiersma, MD  ondansetron (ZOFRAN-ODT) 4 MG disintegrating tablet Place 8 mg under the tongue 2 (two) times daily.   Yes Historical Ma Munoz, MD  senna (SENOKOT) 8.6 MG TABS tablet Take 2 tablets by mouth at bedtime.   Yes Historical Jeannene Tschetter, MD  omeprazole (PRILOSEC) 20 MG capsule Take 1 po BID x 2 weeks then once a day Patient not taking: Reported on 05/25/2015 10/08/14   Rolland Porter, MD  ondansetron (ZOFRAN) 4 MG tablet Take 1 tablet (4 mg total) by mouth every 8 (eight) hours as needed. Patient not taking: Reported on 05/25/2015 10/08/14   Rolland Porter, MD  Potassium Chloride 40 MEQ/15ML (20%) SOLN Take 15 mLs by mouth at bedtime. Take dose before bed tonight 05/13/15   Orbie Pyo, MD  potassium chloride SA (K-DUR,KLOR-CON) 20 MEQ tablet Take 1 tablet (20 mEq total) by mouth 2 (two) times daily. Patient not taking: Reported on 05/25/2015 10/08/14   Rolland Porter, MD   BP 108/86 mmHg  Pulse 120  Temp(Src) 97.7 F (36.5 C) (Oral)  Resp 20  Ht 5\' 11"  (1.803 m)  Wt 142 lb 4.8 oz (64.547 kg)  BMI 19.86 kg/m2  SpO2 99% Physical Exam  Constitutional: He is oriented to person, place, and time. He appears well-developed and well-nourished. No distress.  HENT:  Head: Normocephalic and atraumatic.  Neck: Normal range of motion. Neck supple.  Cardiovascular: Normal rate, regular rhythm and normal heart sounds.   No murmur heard. Pulmonary/Chest: Effort normal and breath sounds normal. No respiratory distress. He has no wheezes.  Abdominal: Soft. Bowel sounds are normal. He exhibits no distension and no mass. There is tenderness.  There is tenderness to palpation in the periumbilical region. There is no rebound or guarding.  Musculoskeletal: Normal range of motion. He exhibits no  edema.  Neurological: He is alert and oriented to person, place, and time.  Skin: Skin is warm and dry. He is not diaphoretic.  Nursing note and vitals reviewed.   ED Course  Procedures (including critical care time) Labs Review Labs Reviewed  CBC WITH DIFFERENTIAL/PLATELET - Abnormal; Notable for the following:    WBC 15.0 (*)    RBC 4.20 (*)    Neutro Abs 11.7 (*)    Monocytes Absolute 1.4 (*)    All other components within normal limits  COMPREHENSIVE METABOLIC PANEL  LIPASE, BLOOD  URINALYSIS, ROUTINE W REFLEX MICROSCOPIC (NOT AT [facility])    Imaging Review No results found. I have personally reviewed and evaluated these images and lab results as part of my medical decision-making.   EKG Interpretation None      MDM   Final diagnoses:  None    CT scan shows inflammation surrounding the hiatal hernia concerning for either mediastinitis or edema around the hiatal hernia. This finding has been discussed with Dr. Arnoldo Morale from surgery who is recommending admission to medicine. I've spoken with Dr. Darrick Meigs who agrees to admit, however would like to speak with Dr. Oneida Alar to ensure that this patient is appropriate for [facility]. Dr. Oneida Alar believes patient is appropriate for admission here. She will be admitted to the medicine service for likely endoscopy tomorrow.    Veryl Speak, MD 05/25/15 2213

## 2015-05-25 NOTE — ED Notes (Signed)
Dr Delo at bedside,  

## 2015-05-25 NOTE — ED Notes (Signed)
Pt is c/o abdominal pain and vomiting for the last couple of weeks; pt has lost about 15lbs in the last couple of weeks; pt is suppose to see a gastroenterologist but the appointment has not been set up yet

## 2015-05-25 NOTE — ED Notes (Signed)
CRITICAL VALUE ALERT  Critical value received:  Potassium 2.4  Date of notification:  05/25/2015  Time of notification:  20:58  Critical value read back: yes  Nurse who received alert:  Atilano Median Rn   MD notified (1st page):  Dr Stark Jock  Time of first page:  20:58  MD notified (2nd page):  Time of second page:  Responding MD:  Dr Stark Jock  Time MD responded:  [phone]

## 2015-05-26 ENCOUNTER — Encounter (HOSPITAL_COMMUNITY)
Admission: EM | Disposition: A | Discharge status: Home / Self Care | Payer: Self-pay | Source: Home / Self Care | Attending: Internal Medicine

## 2015-05-26 ENCOUNTER — Encounter (HOSPITAL_COMMUNITY): Payer: Self-pay | Admitting: *Deleted

## 2015-05-26 DIAGNOSIS — K2100 Gastro-esophageal reflux disease with esophagitis, without bleeding: Secondary | ICD-10-CM | POA: Insufficient documentation

## 2015-05-26 DIAGNOSIS — K449 Diaphragmatic hernia without obstruction or gangrene: Secondary | ICD-10-CM

## 2015-05-26 DIAGNOSIS — F209 Schizophrenia, unspecified: Secondary | ICD-10-CM

## 2015-05-26 DIAGNOSIS — K209 Esophagitis, unspecified without bleeding: Secondary | ICD-10-CM | POA: Insufficient documentation

## 2015-05-26 DIAGNOSIS — R1013 Epigastric pain: Secondary | ICD-10-CM

## 2015-05-26 DIAGNOSIS — Z720 Tobacco use: Secondary | ICD-10-CM

## 2015-05-26 DIAGNOSIS — R933 Abnormal findings on diagnostic imaging of other parts of digestive tract: Secondary | ICD-10-CM

## 2015-05-26 HISTORY — PX: ESOPHAGOGASTRODUODENOSCOPY: SHX5428

## 2015-05-26 HISTORY — PX: ESOPHAGEAL DILATION: SHX303

## 2015-05-26 LAB — CBC
HEMATOCRIT: 38.2 % — AB (ref 39.0–52.0)
HEMOGLOBIN: 13.1 g/dL (ref 13.0–17.0)
MCH: 32.5 pg (ref 26.0–34.0)
MCHC: 34.3 g/dL (ref 30.0–36.0)
MCV: 94.8 fL (ref 78.0–100.0)
Platelets: 264 10*3/uL (ref 150–400)
RBC: 4.03 MIL/uL — ABNORMAL LOW (ref 4.22–5.81)
RDW: 12.3 % (ref 11.5–15.5)
WBC: 13.4 10*3/uL — ABNORMAL HIGH (ref 4.0–10.5)

## 2015-05-26 LAB — COMPREHENSIVE METABOLIC PANEL
ALK PHOS: 99 U/L (ref 38–126)
ALT: 11 U/L — ABNORMAL LOW (ref 17–63)
ANION GAP: 7 (ref 5–15)
AST: 15 U/L (ref 15–41)
Albumin: 3.6 g/dL (ref 3.5–5.0)
BILIRUBIN TOTAL: 0.6 mg/dL (ref 0.3–1.2)
BUN: 7 mg/dL (ref 6–20)
CALCIUM: 8.5 mg/dL — AB (ref 8.9–10.3)
CO2: 35 mmol/L — ABNORMAL HIGH (ref 22–32)
Chloride: 96 mmol/L — ABNORMAL LOW (ref 101–111)
Creatinine, Ser: 0.77 mg/dL (ref 0.61–1.24)
GLUCOSE: 126 mg/dL — AB (ref 65–99)
POTASSIUM: 2.9 mmol/L — AB (ref 3.5–5.1)
Sodium: 138 mmol/L (ref 135–145)
TOTAL PROTEIN: 6.7 g/dL (ref 6.5–8.1)

## 2015-05-26 LAB — GLUCOSE, CAPILLARY: GLUCOSE-CAPILLARY: 104 mg/dL — AB (ref 65–99)

## 2015-05-26 LAB — MAGNESIUM: MAGNESIUM: 2.1 mg/dL (ref 1.7–2.4)

## 2015-05-26 SURGERY — EGD (ESOPHAGOGASTRODUODENOSCOPY)
Anesthesia: Moderate Sedation

## 2015-05-26 MED ORDER — ACETAMINOPHEN 325 MG PO TABS: 650.0000 mg | ORAL_TABLET | Freq: Four times a day (QID) | ORAL | Status: DC | PRN

## 2015-05-26 MED ORDER — PANTOPRAZOLE SODIUM 40 MG IV SOLR
40.0000 mg | Freq: Two times a day (BID) | INTRAVENOUS | Status: DC
  Administered 2015-05-26 – 2015-05-28 (×6): 40 mg via INTRAVENOUS
  Filled 2015-05-26 (×6): qty 40

## 2015-05-26 MED ORDER — POTASSIUM CHLORIDE 10 MEQ/100ML IV SOLN
INTRAVENOUS | Status: AC
  Administered 2015-05-26: 10 meq
  Filled 2015-05-26: qty 100

## 2015-05-26 MED ORDER — POTASSIUM CHLORIDE 10 MEQ/100ML IV SOLN
10.0000 meq | INTRAVENOUS | Status: AC
  Administered 2015-05-26 (×3): 10 meq via INTRAVENOUS
  Filled 2015-05-26: qty 100

## 2015-05-26 MED ORDER — ONDANSETRON HCL 4 MG/2ML IJ SOLN: 4.0000 mg | Freq: Four times a day (QID) | INTRAMUSCULAR | Status: DC | PRN

## 2015-05-26 MED ORDER — POTASSIUM CHLORIDE 10 MEQ/100ML IV SOLN
10.0000 meq | INTRAVENOUS | Status: DC
  Administered 2015-05-26 (×2): 10 meq via INTRAVENOUS
  Filled 2015-05-26: qty 100

## 2015-05-26 MED ORDER — MIDAZOLAM HCL 5 MG/5ML IJ SOLN
INTRAMUSCULAR | Status: AC
  Filled 2015-05-26: qty 10

## 2015-05-26 MED ORDER — STERILE WATER FOR IRRIGATION IR SOLN
Status: DC | PRN
  Administered 2015-05-26: 15:00:00

## 2015-05-26 MED ORDER — ENOXAPARIN SODIUM 40 MG/0.4ML ~~LOC~~ SOLN: 40.0000 mg | SUBCUTANEOUS | Status: DC

## 2015-05-26 MED ORDER — SUCRALFATE 1 GM/10ML PO SUSP
1.0000 g | Freq: Three times a day (TID) | ORAL | Status: DC
  Administered 2015-05-26 – 2015-05-28 (×8): 1 g via ORAL
  Filled 2015-05-26 (×12): qty 10

## 2015-05-26 MED ORDER — SODIUM CHLORIDE 0.9 % IV SOLN
INTRAVENOUS | Status: DC
  Administered 2015-05-26: 1000 mL via INTRAVENOUS

## 2015-05-26 MED ORDER — LIDOCAINE VISCOUS 2 % MT SOLN
OROMUCOSAL | Status: AC
  Filled 2015-05-26: qty 15

## 2015-05-26 MED ORDER — PROMETHAZINE HCL 25 MG/ML IJ SOLN
25.0000 mg | INTRAMUSCULAR | Status: AC
  Administered 2015-05-26: 25 mg via INTRAVENOUS

## 2015-05-26 MED ORDER — POTASSIUM CHLORIDE IN NACL 20-0.9 MEQ/L-% IV SOLN
INTRAVENOUS | Status: DC
  Administered 2015-05-26 – 2015-05-28 (×4): via INTRAVENOUS

## 2015-05-26 MED ORDER — MEPERIDINE HCL 100 MG/ML IJ SOLN
INTRAMUSCULAR | Status: AC
  Filled 2015-05-26: qty 2

## 2015-05-26 MED ORDER — ONDANSETRON HCL 4 MG/2ML IJ SOLN
INTRAMUSCULAR | Status: AC
  Filled 2015-05-26: qty 2

## 2015-05-26 MED ORDER — MEPERIDINE HCL 100 MG/ML IJ SOLN
INTRAMUSCULAR | Status: DC | PRN
  Administered 2015-05-26: 50 mg via INTRAVENOUS

## 2015-05-26 MED ORDER — PROMETHAZINE HCL 25 MG/ML IJ SOLN
INTRAMUSCULAR | Status: AC
  Filled 2015-05-26: qty 1

## 2015-05-26 MED ORDER — MIDAZOLAM HCL 5 MG/5ML IJ SOLN
INTRAMUSCULAR | Status: DC | PRN
  Administered 2015-05-26: 2 mg via INTRAVENOUS

## 2015-05-26 MED ORDER — MORPHINE SULFATE (PF) 2 MG/ML IV SOLN: 2.0000 mg | INTRAVENOUS | Status: DC | PRN

## 2015-05-26 MED ORDER — SODIUM CHLORIDE 0.9 % IV SOLN
INTRAVENOUS | Status: DC
Start: 2015-05-26 — End: 2015-05-26
  Administered 2015-05-26: 01:00:00 via INTRAVENOUS

## 2015-05-26 MED ORDER — ACETAMINOPHEN 650 MG RE SUPP: 650.0000 mg | Freq: Four times a day (QID) | RECTAL | Status: DC | PRN

## 2015-05-26 MED ORDER — ONDANSETRON HCL 4 MG/2ML IJ SOLN
INTRAMUSCULAR | Status: DC | PRN
  Administered 2015-05-26: 4 mg via INTRAVENOUS

## 2015-05-26 MED ORDER — LIDOCAINE VISCOUS 2 % MT SOLN
OROMUCOSAL | Status: DC | PRN
  Administered 2015-05-26: 1 via OROMUCOSAL

## 2015-05-26 MED ORDER — HALOPERIDOL LACTATE 5 MG/ML IJ SOLN: 1.0000 mg | Freq: Four times a day (QID) | INTRAMUSCULAR | Status: DC | PRN

## 2015-05-26 MED ORDER — ONDANSETRON HCL 4 MG PO TABS: 4.0000 mg | ORAL_TABLET | Freq: Four times a day (QID) | ORAL | Status: DC | PRN

## 2015-05-26 NOTE — Consult Note (Signed)
Reason for Consult: Hiatal hernia, question mediastinitis  Referring Physician: Hospitalist  Danny Greene is an 54 y.o. male.  HPI: Patient is a 54 year old schizophrenic white male who was brought in by a caregiver with symptoms of upper abdominal pain. CT scan the chest and abdomen revealed a moderate size hiatal hernia with a thickened wall at the gastroesophageal juncture as well as fluid around this region. Examination is limited secondary to patient's mental status. Patient states this morning that he has no abdominal or chest pain.  Past Medical History  Diagnosis Date  . Schizophrenia   . Hepatitis C   . Hypertension   . GERD (gastroesophageal reflux disease)   . Constipation     Past Surgical History  Procedure Laterality Date  . Cholecystectomy      History reviewed. No pertinent family history.  Social History:  reports that he has been smoking.  He does not have any smokeless tobacco history on file. He reports that he does not drink alcohol or use illicit drugs.  Allergies: No Known Allergies  Medications: I have reviewed the patient's current medications.  Results for orders placed or performed during the hospital encounter of 05/25/15 (from the past 48 hour(s))  Comprehensive metabolic panel     Status: Abnormal   Collection Time: 05/25/15  8:28 PM  Result Value Ref Range   Sodium 137 135 - 145 mmol/L   Potassium 2.4 (LL) 3.5 - 5.1 mmol/L    Comment: CRITICAL RESULT CALLED TO, READ BACK BY AND VERIFIED WITH: POINDEXTER,M AT 2058 ON 05/25/15 BY ISLEY,B    Chloride 92 (L) 101 - 111 mmol/L   CO2 33 (H) 22 - 32 mmol/L   Glucose, Bld 142 (H) 65 - 99 mg/dL   BUN 10 6 - 20 mg/dL   Creatinine, Ser 1.01 0.61 - 1.24 mg/dL   Calcium 8.9 8.9 - 10.3 mg/dL   Total Protein 7.2 6.5 - 8.1 g/dL   Albumin 3.8 3.5 - 5.0 g/dL   AST 20 15 - 41 U/L   ALT 12 (L) 17 - 63 U/L   Alkaline Phosphatase 105 38 - 126 U/L   Total Bilirubin 0.6 0.3 - 1.2 mg/dL   GFR calc non Af Amer  >60 >60 mL/min   GFR calc Af Amer >60 >60 mL/min    Comment: (NOTE) The eGFR has been calculated using the CKD EPI equation. This calculation has not been validated in all clinical situations. eGFR's persistently <60 mL/min signify possible Chronic Kidney Disease.    Anion gap 12 5 - 15  Lipase, blood     Status: None   Collection Time: 05/25/15  8:28 PM  Result Value Ref Range   Lipase 29 22 - 51 U/L  CBC with Differential     Status: Abnormal   Collection Time: 05/25/15  8:28 PM  Result Value Ref Range   WBC 15.0 (H) 4.0 - 10.5 K/uL   RBC 4.20 (L) 4.22 - 5.81 MIL/uL   Hemoglobin 13.9 13.0 - 17.0 g/dL   HCT 39.8 39.0 - 52.0 %   MCV 94.8 78.0 - 100.0 fL   MCH 33.1 26.0 - 34.0 pg   MCHC 34.9 30.0 - 36.0 g/dL   RDW 12.4 11.5 - 15.5 %   Platelets 266 150 - 400 K/uL   Neutrophils Relative % 78 %   Neutro Abs 11.7 (H) 1.7 - 7.7 K/uL   Lymphocytes Relative 13 %   Lymphs Abs 1.9 0.7 - 4.0 K/uL  Monocytes Relative 9 %   Monocytes Absolute 1.4 (H) 0.1 - 1.0 K/uL   Eosinophils Relative 0 %   Eosinophils Absolute 0.0 0.0 - 0.7 K/uL   Basophils Relative 0 %   Basophils Absolute 0.0 0.0 - 0.1 K/uL  Urinalysis, Routine w reflex microscopic     Status: Abnormal   Collection Time: 05/25/15  9:50 PM  Result Value Ref Range   Color, Urine YELLOW YELLOW   APPearance CLEAR CLEAR   Specific Gravity, Urine <1.005 (L) 1.005 - 1.030   pH 8.5 (H) 5.0 - 8.0   Glucose, UA NEGATIVE NEGATIVE mg/dL   Hgb urine dipstick SMALL (A) NEGATIVE   Bilirubin Urine NEGATIVE NEGATIVE   Ketones, ur NEGATIVE NEGATIVE mg/dL   Protein, ur NEGATIVE NEGATIVE mg/dL   Urobilinogen, UA 0.2 0.0 - 1.0 mg/dL   Nitrite NEGATIVE NEGATIVE   Leukocytes, UA NEGATIVE NEGATIVE  Urine microscopic-add on     Status: Abnormal   Collection Time: 05/25/15  9:50 PM  Result Value Ref Range   RBC / HPF 0-2 <3 RBC/hpf   Bacteria, UA MANY (A) RARE   Urine-Other AMORPHOUS URATES/PHOSPHATES   CBC     Status: Abnormal   Collection  Time: 05/26/15  7:41 AM  Result Value Ref Range   WBC 13.4 (H) 4.0 - 10.5 K/uL   RBC 4.03 (L) 4.22 - 5.81 MIL/uL   Hemoglobin 13.1 13.0 - 17.0 g/dL   HCT 38.2 (L) 39.0 - 52.0 %   MCV 94.8 78.0 - 100.0 fL   MCH 32.5 26.0 - 34.0 pg   MCHC 34.3 30.0 - 36.0 g/dL   RDW 12.3 11.5 - 15.5 %   Platelets 264 150 - 400 K/uL  Comprehensive metabolic panel     Status: Abnormal   Collection Time: 05/26/15  7:41 AM  Result Value Ref Range   Sodium 138 135 - 145 mmol/L   Potassium 2.9 (L) 3.5 - 5.1 mmol/L    Comment: DELTA CHECK NOTED   Chloride 96 (L) 101 - 111 mmol/L   CO2 35 (H) 22 - 32 mmol/L   Glucose, Bld 126 (H) 65 - 99 mg/dL   BUN 7 6 - 20 mg/dL   Creatinine, Ser 0.77 0.61 - 1.24 mg/dL   Calcium 8.5 (L) 8.9 - 10.3 mg/dL   Total Protein 6.7 6.5 - 8.1 g/dL   Albumin 3.6 3.5 - 5.0 g/dL   AST 15 15 - 41 U/L   ALT 11 (L) 17 - 63 U/L   Alkaline Phosphatase 99 38 - 126 U/L   Total Bilirubin 0.6 0.3 - 1.2 mg/dL   GFR calc non Af Amer >60 >60 mL/min   GFR calc Af Amer >60 >60 mL/min    Comment: (NOTE) The eGFR has been calculated using the CKD EPI equation. This calculation has not been validated in all clinical situations. eGFR's persistently <60 mL/min signify possible Chronic Kidney Disease.    Anion gap 7 5 - 15    Ct Chest W Contrast  05/25/2015   CLINICAL DATA:  Hiatal hernia, abnormal appearance of distal esophagus showing hiatal hernia with surrounding fluid mediastinum question esophagitis versus mediastinitis  EXAM: CT CHEST WITH CONTRAST  TECHNIQUE: Multidetector CT imaging of the chest was performed during intravenous contrast administration. Sagittal and coronal MPR images reconstructed from axial data set.  CONTRAST:  57m OMNIPAQUE IOHEXOL 300 MG/ML  SOLN IV  COMPARISON:  CT abdomen pelvis 05/25/2015  FINDINGS: Thoracic vascular structures patent and unremarkable on nondedicated exam.  No thoracic adenopathy.  Diffuse wall thickening of the esophagus consistent with  esophagitis.  Air-fluid level seen within mid thoracic esophagus with fluid filled distal esophagus.  Esophageal wall thickening becomes greater more distally in extends into a thickened wall of the hiatal hernia.  Infiltrate changes are seen in the retrocrural space and extending around the hiatal hernia.  Small lymph nodes within the retrocrural space.  No pneumomediastinum or pneumothorax.  Several small curvilinear vessels are seen within the paraesophageal fat question tiny varices.  Linear subsegmental atelectasis at both lung bases.  Lungs otherwise clear.  No pleural effusion or pneumothorax.  Visualized portion of upper abdomen otherwise grossly unremarkable.  IMPRESSION: Small hiatal hernia.  Diffusely thickened wall of the hiatal hernia as well as the mid to distal esophagus with mild infiltrative changes of the fat at retrocrural space and inferior mediastinum adjacent to esophagus.  No evidence of esophageal perforation.  Findings are compatible with esophagitis.  The presence of edema surrounding the esophagus and hiatal hernia is abnormal for typical uncomplicated esophagitis; potentially this could be related to edema secondary to multiple episodes of vomiting if the hiatal hernia repeatedly traversed the edge of the hiatus, severe atypical esophagitis, or even Crohn's disease.   Electronically Signed   By: Lavonia Dana M.D.   On: 05/25/2015 23:37   Ct Abdomen Pelvis W Contrast  05/25/2015   CLINICAL DATA:  Nausea and vomiting and epigastric pain tonight. Hepatitis-C.  EXAM: CT ABDOMEN AND PELVIS WITH CONTRAST  TECHNIQUE: Multidetector CT imaging of the abdomen and pelvis was performed using the standard protocol following bolus administration of intravenous contrast.  CONTRAST:  37m OMNIPAQUE IOHEXOL 300 MG/ML SOLN, 1070mOMNIPAQUE IOHEXOL 300 MG/ML SOLN  COMPARISON:  None.  FINDINGS: Lower chest: There is a moderate hiatal hernia with edema in the mucosa of the hernia as well some free fluid in  the middle mediastinum around the edematous hernia. This could represent severe acute esophagitis/mediastinitis. Slight focal atelectasis or scarring at the lung bases posteriorly. Heart size is normal.  Hepatobiliary: Normal.  Pancreas: Normal.  Spleen: Normal.  Adrenals/Urinary Tract: Normal.  Stomach/Bowel: Normal including the terminal ileum and appendix.  Vascular/Lymphatic: Normal.  No adenopathy.  Reproductive: Normal.  Other: No free air or free fluid.  Musculoskeletal: Old compression fracture of L1.  Otherwise normal.  IMPRESSION: Moderate hiatal hernia with free fluid around the hernia in the middle mediastinum. This could represent esophagitis/ mediastinitis and/or inflammation of the herniated gastric fundus. CT scan of the chest recommended for further evaluation of the mediastinum.   Electronically Signed   By: JaLorriane Shire.D.   On: 05/25/2015 21:39    ROS: See chart Blood pressure 121/72, pulse 99, temperature 98.7 F (37.1 C), temperature source Oral, resp. rate 20, height _0  (1.803 m), weight 64.456 kg (142 lb 1.6 oz), SpO2 99 %. Physical Exam: Withdrawn white male in no acute distress. Lungs clear to auscultation with equal breath sounds bilaterally. Abdomen is soft, nontender, nondistended. No hepatosplenomegaly, masses, or hernias are identified.  Assessment/Plan: Impression: moderate size hiatal hernia, possible distal esophagitis. There is no evidence of esophageal perforation. His leukocytosis is improving. GI consultation is pending. I think the patient would benefit from an EGD. I doubt the patient has ischemia secondary to a volvulus. No need for surgical intervention at this time. We'll follow with you.  JENKINS,MARK A 05/26/2015, 9:06 AM

## 2015-05-26 NOTE — Progress Notes (Signed)
Initial Nutrition Assessment  DOCUMENTATION CODES:  Not applicable  INTERVENTION:  Once diet advanced, monitor oral intake and add supplements/snacks as warranted.   NUTRITION DIAGNOSIS:  Inadequate oral intake related to nausea, vomiting, abdominal pain as evidenced by prior patient report  GOAL:  Patient will meet greater than or equal to 90% of their needs  MONITOR:  Diet advancement, Labs, Weight trends  REASON FOR ASSESSMENT:  Malnutrition Screening Tool    ASSESSMENT:  54 y.o. Male PMhx schizophrenia,  hepatitis C, HTN, GERD. Presents with complaints of abd pain, nausea, diarrhea, vomiting for last couple weeks. Endorses 15 lb wt loss. CT scan revealed a moderate size hiatal hernia   Pt initially responded to my presence and briefly opened his eyes, but would not respondd to any of my questions and did not interact further.  Unable to obtain any wt/diet hx.   NFPE: Not attempted  Diet Order:  Diet NPO time specified  Skin:  Reviewed, no issues  Last BM:  9/21  Height:  Ht Readings from Last 1 Encounters:  05/25/15 5\' 11"  (1.803 m)   Weight:  Wt Readings from Last 1 Encounters:  05/26/15 142 lb 1.6 oz (64.456 kg)   Wt Readings from Last 10 Encounters:  05/26/15 142 lb 1.6 oz (64.456 kg)  05/13/15 146 lb (66.225 kg)  10/07/14 153 lb 11.2 oz (69.718 kg)   Ideal Body Weight:  78.2 kg  BMI:  Body mass index is 19.83 kg/(m^2).  Estimated Nutritional Needs:  Kcal:  1800-2050 (28-32 kcal/kg) Protein:  78-94g (1-1.2 g/kg IBW) Fluid:  1.8-2 liters fluid  EDUCATION NEEDS:  No education needs identified at this time  Burtis Junes RD, LDN Nutrition Pager: [phone] 05/26/2015 12:15 PM

## 2015-05-26 NOTE — ED Notes (Signed)
Attempted to call report. Was advised nurse receiving pt will call this nurse back for report. 

## 2015-05-26 NOTE — Progress Notes (Signed)
[facility] PROGRESS NOTE  Ladarion Munyon JAS:[id number] DOB: 10-05-1960 DOA: 05/25/2015 PCP: Lauretta Grill, NP    Code Status: Full code Family Communication: Discussed with patient; family not available. Disposition Plan: Discharge back to the group home when clinically appropriate.   Consultants:  Gen. surgery  Gastroenterology  Procedures:  Plan EGD, pending  Antibiotics:  None  HPI/Subjective: Patient denies nausea and vomiting currently. His lower abdominal pain has subsided. He denies burning chest pain or reflux symptoms currently.  Objective: Filed Vitals:   05/26/15 0110  BP: 121/72  Pulse: 99  Temp: 98.7 F (37.1 C)  Resp: 20   oxygen saturation 99%. No intake or output data in the 24 hours ending 05/26/15 1118 Filed Weights   05/25/15 2002 05/26/15 0110  Weight: 64.547 kg (142 lb 4.8 oz) 64.456 kg (142 lb 1.6 oz)    Exam:   General:  Subdued 54 year old Caucasian man laying in bed, in no acute distress.  Cardiovascular: S1, S2, with soft systolic murmur.  Respiratory: Clear to auscultation bilaterally.  Abdomen: Positive bowel sounds, soft, nontender, nondistended.  Musculoskeletal/extremities: No pedal edema. No acute hot red joints.  Neuropsychiatric: His affect appears subdued, but appropriate. He is alert and oriented to himself in the hospital. His speech is clear.   Data Reviewed: Basic Metabolic Panel:  Recent Labs Lab 05/25/15 2028 05/26/15 0741  NA 137 138  K 2.4* 2.9*  CL 92* 96*  CO2 33* 35*  GLUCOSE 142* 126*  BUN 10 7  CREATININE 1.01 0.77  CALCIUM 8.9 8.5*  MG  --  2.1   Liver Function Tests:  Recent Labs Lab 05/25/15 2028 05/26/15 0741  AST 20 15  ALT 12* 11*  ALKPHOS 105 99  BILITOT 0.6 0.6  PROT 7.2 6.7  ALBUMIN 3.8 3.6    Recent Labs Lab 05/25/15 2028  LIPASE 29   No results for input(s): AMMONIA in the last 168 hours. CBC:  Recent Labs Lab 05/25/15 2028 05/26/15 0741  WBC  15.0* 13.4*  NEUTROABS 11.7*  --   HGB 13.9 13.1  HCT 39.8 38.2*  MCV 94.8 94.8  PLT 266 264   Cardiac Enzymes: No results for input(s): CKTOTAL, CKMB, CKMBINDEX, TROPONINI in the last 168 hours. BNP (last 3 results) No results for input(s): BNP in the last 8760 hours.  ProBNP (last 3 results) No results for input(s): PROBNP in the last 8760 hours.  CBG: No results for input(s): GLUCAP in the last 168 hours.  No results found for this or any previous visit (from the past 240 hour(s)).   Studies: Ct Chest W Contrast  05/25/2015   CLINICAL DATA:  Hiatal hernia, abnormal appearance of distal esophagus showing hiatal hernia with surrounding fluid mediastinum question esophagitis versus mediastinitis  EXAM: CT CHEST WITH CONTRAST  TECHNIQUE: Multidetector CT imaging of the chest was performed during intravenous contrast administration. Sagittal and coronal MPR images reconstructed from axial data set.  CONTRAST:  93mL OMNIPAQUE IOHEXOL 300 MG/ML  SOLN IV  COMPARISON:  CT abdomen pelvis 05/25/2015  FINDINGS: Thoracic vascular structures patent and unremarkable on nondedicated exam.  No thoracic adenopathy.  Diffuse wall thickening of the esophagus consistent with esophagitis.  Air-fluid level seen within mid thoracic esophagus with fluid filled distal esophagus.  Esophageal wall thickening becomes greater more distally in extends into a thickened wall of the hiatal hernia.  Infiltrate changes are seen in the retrocrural space and extending around the hiatal hernia.  Small lymph nodes within the retrocrural space.  No pneumomediastinum or pneumothorax.  Several small curvilinear vessels are seen within the paraesophageal fat question tiny varices.  Linear subsegmental atelectasis at both lung bases.  Lungs otherwise clear.  No pleural effusion or pneumothorax.  Visualized portion of upper abdomen otherwise grossly unremarkable.  IMPRESSION: Small hiatal hernia.  Diffusely thickened wall of the hiatal  hernia as well as the mid to distal esophagus with mild infiltrative changes of the fat at retrocrural space and inferior mediastinum adjacent to esophagus.  No evidence of esophageal perforation.  Findings are compatible with esophagitis.  The presence of edema surrounding the esophagus and hiatal hernia is abnormal for typical uncomplicated esophagitis; potentially this could be related to edema secondary to multiple episodes of vomiting if the hiatal hernia repeatedly traversed the edge of the hiatus, severe atypical esophagitis, or even Crohn's disease.   Electronically Signed   By: Lavonia Dana M.D.   On: 05/25/2015 23:37   Ct Abdomen Pelvis W Contrast  05/25/2015   CLINICAL DATA:  Nausea and vomiting and epigastric pain tonight. Hepatitis-C.  EXAM: CT ABDOMEN AND PELVIS WITH CONTRAST  TECHNIQUE: Multidetector CT imaging of the abdomen and pelvis was performed using the standard protocol following bolus administration of intravenous contrast.  CONTRAST:  25mL OMNIPAQUE IOHEXOL 300 MG/ML SOLN, 150mL OMNIPAQUE IOHEXOL 300 MG/ML SOLN  COMPARISON:  None.  FINDINGS: Lower chest: There is a moderate hiatal hernia with edema in the mucosa of the hernia as well some free fluid in the middle mediastinum around the edematous hernia. This could represent severe acute esophagitis/mediastinitis. Slight focal atelectasis or scarring at the lung bases posteriorly. Heart size is normal.  Hepatobiliary: Normal.  Pancreas: Normal.  Spleen: Normal.  Adrenals/Urinary Tract: Normal.  Stomach/Bowel: Normal including the terminal ileum and appendix.  Vascular/Lymphatic: Normal.  No adenopathy.  Reproductive: Normal.  Other: No free air or free fluid.  Musculoskeletal: Old compression fracture of L1.  Otherwise normal.  IMPRESSION: Moderate hiatal hernia with free fluid around the hernia in the middle mediastinum. This could represent esophagitis/ mediastinitis and/or inflammation of the herniated gastric fundus. CT scan of the  chest recommended for further evaluation of the mediastinum.   Electronically Signed   By: Lorriane Shire M.D.   On: 05/25/2015 21:39    Scheduled Meds: . pantoprazole (PROTONIX) IV  40 mg Intravenous Q12H  . potassium chloride  10 mEq Intravenous Q1 Hr x 4   Continuous Infusions: . sodium chloride 100 mL/hr at 05/26/15 0045    Assessment and plan:  Principal Problem:   Abdominal pain Active Problems:   Abnormal CT scan, esophagus   Esophagitis   Hiatal hernia   Hypokalemia   Schizophrenia, unspecified type   Tobacco abuse   1. Epigastric abdominal pain. Per the chest and abdomen/pelvic findings, there is a possibility of esophagitis, mediastinitis, and/or inflammation of the herniated gastric fundus. His liver transaminases and lipase were within normal limits. - Patient was made to be nothing by mouth. IV Protonix was started every 12 hours. As needed morphine was ordered for pain. Zofran was ordered as needed for nausea/vomiting. -Gen. surgery was consulted. Dr. Arnoldo Morale evaluated the patient and felt that there was no evidence of ischemia secondary to a volvulus and no need for surgical intervention at this time. -Gastroenterology has been consulted. They agree with PPI therapy. The tentative plan is for the patient undergo an EGD this afternoon.  Leukocytosis. The patient's white blood cell count is modestly elevated. Etiology unclear, but his urinalysis is not consistent  with infection and CT of his chest revealed no infiltrate. Elevation could be reactive. His white blood cell count is trending downward.  Hypokalemia. Patient denies vomiting or diarrhea. Etiology of hypokalemia is on clear. He was given 3 runs of potassium chloride following admission. We'll give him another 4 runs of potassium chloride. Will add potassium chloride to his maintenance IV fluids. His magnesium level was within normal limits. -Would give potassium chloride orally when he is no longer nothing by  mouth and is okay with GI.  Tobacco abuse. Patient smokes one pack of cigarettes per day. He was encouraged to stop smoking. He declined a nicotine patch.  Schizophrenia. Patient is currently stable and without agitation. He is treated chronically with Clozapine and Luvox. These medications are on hold, because of his nothing by mouth status and pending EGD. As needed IV Haldol was started. -When the patient is no longer nothing by mouth, would restart his chronic medications as soon as possible.     Time spent: 35 minutes    [facility] Pager [phone]. If 7PM-7AM, please contact night-coverage at [email or web address], password [facility] 05/26/2015, 11:18 AM  LOS: 1 day

## 2015-05-26 NOTE — Clinical Social Work Note (Signed)
Clinical Social Work Assessment  Patient Details  Name: Danny Greene MRN: [id number] Date of Birth: 03-06-1961  Date of referral:  05/26/15               Reason for consult:  Facility Placement                Permission sought to share information with:    Permission granted to share information::     Name::        Agency::     Relationship::     Contact Information:     Housing/Transportation Living arrangements for the past 2 months:  [place] of Information:  Patient, Facility Patient Interpreter Needed:  None Criminal Activity/Legal Involvement Pertinent to Current Situation/Hospitalization:  No - Comment as needed Significant Relationships:  Other(Comment) Acupuncturist) Lives with:  Facility Resident Do you feel safe going back to the place where you live?  Yes Need for family participation in patient care:   (pt has no family)  Care giving concerns:  Pt is resident at [facility].   Social Worker assessment / plan:  CSW met with pt at bedside. Pt alert and oriented, but lethargic. He answered several questions, but then fell asleep. Pt states he is a resident at [facility] and likes it there. He plans to return at d/c. Per [name] at facility, pt has been there for about a year now. He had been at another facility, but came to [facility] from [facility]. Pt has no family involved and makes all decisions per [facility] He has been weaker recently and is a fall risk. Staff have been providing stand by assist while pt ambulates and also assists with all other ADLs. Okay for return. Facility uses [facility] if needed at d/c. Pt scheduled for EGD this afternoon.  Employment status:    Insurance information:  Medicaid In [place] PT Recommendations:  Not assessed at this time Information / Referral to community resources:  Other (Comment Required) (return to [facility])  Patient/Family's Response to care:  Pt agreeable to return to  [facility]   Patient/Family's Understanding of and Emotional Response to Diagnosis, Current Treatment, and Prognosis: Pt too sleepy to discuss at this time. Will attempt to follow up later.   Emotional Assessment Appearance:  Appears stated age Attitude/Demeanor/Rapport:  Lethargic Affect (typically observed):  Other (very tired) Orientation:  Oriented to Self, Oriented to Place, Oriented to  Time, Oriented to Situation Alcohol / Substance use:  Not Applicable Psych involvement (Current and /or in the community):  No (Comment)  Discharge Needs  Concerns to be addressed:  Discharge Planning Concerns Readmission within the last 30 days:  No Current discharge risk:  None Barriers to Discharge:  Continued Medical Work up   [employer], [employer], [place] 05/26/2015, 12:10 PM [phone]

## 2015-05-26 NOTE — Progress Notes (Signed)
Attempted to call number of address of group home.  Called both landline and mobile number with no response.  Patient unable to tell the name of group home or phone number.  Patient needs lots of reinforcement and direction.  Will continue to monitor patient.

## 2015-05-26 NOTE — Care Management Note (Signed)
Case Management Note  Patient Details  Name: Danny Greene MRN: [id number] Date of Birth: [birth year]  Subjective/Objective:                  Pt admitted from [facility]F with abd pain. Anticipate pt will discharge back to facility at discharge.  Action/Plan: CSW is aware and will arrange discharge back to facility when medically stable.  Expected Discharge Date:                  Expected Discharge Plan:  Assisted Living / Rest Home  In-House Referral:  Clinical Social Work  Discharge planning Services  CM Consult  Post Acute Care Choice:  NA Choice offered to:  NA  DME Arranged:    DME Agency:     HH Arranged:    [place] Agency:     Status of Service:  Completed, signed off  Medicare Important Message Given:    Date Medicare IM Given:    Medicare IM give by:    Date Additional Medicare IM Given:    Additional Medicare Important Message give by:     If discussed at [place] of Stay Meetings, dates discussed:    Additional Comments:  Joylene Draft, RN 05/26/2015, 3:38 PM

## 2015-05-26 NOTE — Progress Notes (Signed)
Pt. Returned from Endo. Opens eyes to name call, then goes back to sleep. Denies any pain or discomfort.

## 2015-05-26 NOTE — Consult Note (Signed)
Referring Provider: Rexene Alberts, MD Primary Care Physician:  Lauretta Grill, NP Primary Gastroenterologist:  Garfield Cornea, MD  Reason for Consultation:  Abdominal pain  HPI: Danny Greene is a 54 y.o. male with history of schizophrenia, hepatitis C who presented to the emergency department with a two-week history of upper abdominal pain associated with nausea and vomiting. He resides in a group home/assisted living facility in [place]. Complain a 20+ pound weight loss. Seen in the emergency department at [facility] a couple weeks ago for acute dehydration and hypokalemia. He was discharged home. Chest x-ray at that time was unremarkable. Yesterday he had a CT of the abdomen pelvis with contrast and noted to have a moderate hiatal hernia with free fluid around the hernia in the middle mediastinum thought to represent either severe acute esophagitis/mediastinitis. Dr. Arnoldo Morale was consulted and chest CT was recommended. Chest CT showed diffusely thickened wall of the hiatal hernia as well as the mid to distal esophagus with mild infiltrative changes of the fat at the retrocrural space and inferior mediastinum adjacent to the esophagus. No evidence of esophageal perforation. Findings felt to be compatible with esophagitis although noted that edema usually atypical for uncomplicated esophagitis. Possibly related to edema from multiple episodes of vomiting if the hiatal hernia repair easily traversed edge of the hiatus, severe atypical esophagitis or even Crohn's disease.  Labs showed mild leukocytosis of 15,000. Potassium 2.4, chloride 92, CO2 33.  Patient has been started on IV Protonix every 12 hours.  Patient denies heartburn. States he's had frequent vomiting but no hematemesis. Denies melena or rectal bleeding. Denies constipation or diarrhea. Currently without abdominal pain. Answers my questions appropriately but with short answers and does not elaborate.   Prior to Admission  medications   Medication Sig Start Date End Date Taking? Authorizing Provider  Cholecalciferol (VITAMIN D) 2000 UNITS tablet Take 2,000 Units by mouth daily.   Yes Historical Provider, MD  clozapine (CLOZARIL) 200 MG tablet Take 200 mg by mouth daily.   Yes Historical Provider, MD  fluvoxaMINE (LUVOX) 100 MG tablet Take 100 mg by mouth at bedtime. sleep   Yes Historical Provider, MD  ondansetron (ZOFRAN-ODT) 4 MG disintegrating tablet Place 8 mg under the tongue 2 (two) times daily.   Yes Historical Provider, MD  senna (SENOKOT) 8.6 MG TABS tablet Take 2 tablets by mouth at bedtime.   Yes Historical Provider, MD  omeprazole (PRILOSEC) 20 MG capsule Take 1 po BID x 2 weeks then once a day Patient not taking: Reported on 05/25/2015 10/08/14   Rolland Porter, MD  ondansetron (ZOFRAN) 4 MG tablet Take 1 tablet (4 mg total) by mouth every 8 (eight) hours as needed. Patient not taking: Reported on 05/25/2015 10/08/14   Rolland Porter, MD  Potassium Chloride 40 MEQ/15ML (20%) SOLN Take 15 mLs by mouth at bedtime. Take dose before bed tonight 05/13/15   Orbie Pyo, MD  potassium chloride SA (K-DUR,KLOR-CON) 20 MEQ tablet Take 1 tablet (20 mEq total) by mouth 2 (two) times daily. Patient not taking: Reported on 05/25/2015 10/08/14   Rolland Porter, MD    Current Facility-Administered Medications  Medication Dose Route Frequency Provider Last Rate Last Dose  . 0.9 %  sodium chloride infusion   Intravenous Continuous Oswald Hillock, MD 100 mL/hr at 05/26/15 0045    . acetaminophen (TYLENOL) tablet 650 mg  650 mg Oral Q6H PRN Oswald Hillock, MD       Or  . acetaminophen (TYLENOL) suppository  650 mg  650 mg Rectal Q6H PRN Oswald Hillock, MD      . enoxaparin (LOVENOX) injection 40 mg  40 mg Subcutaneous Q24H Oswald Hillock, MD      . haloperidol lactate (HALDOL) injection 1 mg  1 mg Intravenous Q6H PRN Rexene Alberts, MD      . morphine 2 MG/ML injection 2 mg  2 mg Intravenous Q4H PRN Oswald Hillock, MD      . ondansetron  (ZOFRAN) tablet 4 mg  4 mg Oral Q6H PRN Oswald Hillock, MD       Or  . ondansetron (ZOFRAN) injection 4 mg  4 mg Intravenous Q6H PRN Oswald Hillock, MD      . pantoprazole (PROTONIX) injection 40 mg  40 mg Intravenous Q12H Oswald Hillock, MD   40 mg at 05/26/15 0045  . sodium chloride 0.9 % injection           . sodium chloride 0.9 % injection           . sodium chloride 0.9 % injection             Allergies as of 05/25/2015  . (No Known Allergies)    Past Medical History  Diagnosis Date  . Schizophrenia   . Hepatitis C   . Hypertension   . GERD (gastroesophageal reflux disease)   . Constipation     Past Surgical History  Procedure Laterality Date  . Cholecystectomy    . Kidney stone surgery      Family History  Problem Relation Age of Onset  . Liver disease Neg Hx   . Colon cancer Neg Hx     Social History   Social History  . Marital Status: Single    Spouse Name: N/A  . Number of Children: N/A  . Years of Education: N/A   Occupational History  . Not on file.   Social History Main Topics  . Smoking status: Current Every Day Smoker -- 1.00 packs/day  . Smokeless tobacco: Not on file  . Alcohol Use: No  . Drug Use: No  . Sexual Activity: Not Currently   Other Topics Concern  . Not on file   Social History Narrative     ROS:   General: Negative for anorexia, fever, chills, fatigue, weakness. See hpi Eyes: Negative for vision changes.  ENT: Negative for hoarseness, difficulty swallowing , nasal congestion. CV: Negative for chest pain, angina, palpitations, dyspnea on exertion, peripheral edema.  Respiratory: Negative for dyspnea at rest, dyspnea on exertion, cough, sputum, wheezing.  GI: See history of present illness. GU:  Negative for dysuria, hematuria, urinary incontinence, urinary frequency, nocturnal urination.  MS: Negative for joint pain, low back pain.  Derm: Negative for rash or itching.  Neuro: Negative for weakness, abnormal sensation, seizure,  frequent headaches, memory loss, confusion.  Psych: Negative for anxiety, depression, suicidal ideation, hallucinations.  Endo: see hpi Heme: Negative for bruising or bleeding. Allergy: Negative for rash or hives.       Physical Examination: Vital signs in last 24 hours: Temp:  [97.7 F (36.5 C)-98.7 F (37.1 C)] 98.7 F (37.1 C) (09/22 0110) Pulse Rate:  [98-120] 99 (09/22 0110) Resp:  [17-20] 20 (09/22 0110) BP: (104-121)/(72-87) 121/72 mmHg (09/22 0110) SpO2:  [95 %-99 %] 99 % (09/22 0110) Weight:  [142 lb 1.6 oz (64.456 kg)-142 lb 4.8 oz (64.547 kg)] 142 lb 1.6 oz (64.456 kg) (09/22 0110)    General: thin, well-developed in  no acute distress. Answers questions appropriately but limited answers.  Head: Normocephalic, atraumatic.   Eyes: Conjunctiva pink, no icterus. Mouth: Oropharyngeal mucosa moist and pink , no lesions erythema or exudate. Neck: Supple without thyromegaly, masses, or lymphadenopathy.  Lungs: Clear to auscultation bilaterally.  Heart: Regular rate and rhythm, no murmurs rubs or gallops.  Abdomen: Bowel sounds are normal, nontender, nondistended, no hepatosplenomegaly or masses, no abdominal bruits or    hernia , no rebound or guarding.   Rectal: not performed Extremities: No lower extremity edema, clubbing, deformity.  Neuro: Alert and oriented x 4 , grossly normal neurologically.  Skin: Warm and dry, no rash or jaundice.   Psych: Alert and cooperative, normal mood and affect.        Intake/Output from previous day:   Intake/Output this shift:    Lab Results: CBC  Recent Labs  05/25/15 2028 05/26/15 0741  WBC 15.0* 13.4*  HGB 13.9 13.1  HCT 39.8 38.2*  MCV 94.8 94.8  PLT 266 264   BMET  Recent Labs  05/25/15 2028 05/26/15 0741  NA 137 138  K 2.4* 2.9*  CL 92* 96*  CO2 33* 35*  GLUCOSE 142* 126*  BUN 10 7  CREATININE 1.01 0.77  CALCIUM 8.9 8.5*   LFT  Recent Labs  05/25/15 2028 05/26/15 0741  BILITOT 0.6 0.6  ALKPHOS 105 99   AST 20 15  ALT 12* 11*  PROT 7.2 6.7  ALBUMIN 3.8 3.6    Lipase  Recent Labs  05/25/15 2028  LIPASE 29    PT/INR No results for input(s): LABPROT, INR in the last 72 hours.    Imaging Studies: Dg Chest 2 View  05/13/2015   CLINICAL DATA:  Weakness, dizziness and nausea today.  Weight loss.  EXAM: CHEST  2 VIEW  COMPARISON:  Single view of the chest 12/02/2014.  FINDINGS: The lungs are clear. Heart size is normal. No pneumothorax or pleural effusion. No focal bony abnormality  IMPRESSION: Negative chest   Electronically Signed   By: Inge Rise M.D.   On: 05/13/2015 15:36   Ct Chest W Contrast  05/25/2015   CLINICAL DATA:  Hiatal hernia, abnormal appearance of distal esophagus showing hiatal hernia with surrounding fluid mediastinum question esophagitis versus mediastinitis  EXAM: CT CHEST WITH CONTRAST  TECHNIQUE: Multidetector CT imaging of the chest was performed during intravenous contrast administration. Sagittal and coronal MPR images reconstructed from axial data set.  CONTRAST:  31mL OMNIPAQUE IOHEXOL 300 MG/ML  SOLN IV  COMPARISON:  CT abdomen pelvis 05/25/2015  FINDINGS: Thoracic vascular structures patent and unremarkable on nondedicated exam.  No thoracic adenopathy.  Diffuse wall thickening of the esophagus consistent with esophagitis.  Air-fluid level seen within mid thoracic esophagus with fluid filled distal esophagus.  Esophageal wall thickening becomes greater more distally in extends into a thickened wall of the hiatal hernia.  Infiltrate changes are seen in the retrocrural space and extending around the hiatal hernia.  Small lymph nodes within the retrocrural space.  No pneumomediastinum or pneumothorax.  Several small curvilinear vessels are seen within the paraesophageal fat question tiny varices.  Linear subsegmental atelectasis at both lung bases.  Lungs otherwise clear.  No pleural effusion or pneumothorax.  Visualized portion of upper abdomen otherwise grossly  unremarkable.  IMPRESSION: Small hiatal hernia.  Diffusely thickened wall of the hiatal hernia as well as the mid to distal esophagus with mild infiltrative changes of the fat at retrocrural space and inferior mediastinum adjacent to esophagus.  No evidence of esophageal perforation.  Findings are compatible with esophagitis.  The presence of edema surrounding the esophagus and hiatal hernia is abnormal for typical uncomplicated esophagitis; potentially this could be related to edema secondary to multiple episodes of vomiting if the hiatal hernia repeatedly traversed the edge of the hiatus, severe atypical esophagitis, or even Crohn's disease.   Electronically Signed   By: Lavonia Dana M.D.   On: 05/25/2015 23:37   Ct Abdomen Pelvis W Contrast  05/25/2015   CLINICAL DATA:  Nausea and vomiting and epigastric pain tonight. Hepatitis-C.  EXAM: CT ABDOMEN AND PELVIS WITH CONTRAST  TECHNIQUE: Multidetector CT imaging of the abdomen and pelvis was performed using the standard protocol following bolus administration of intravenous contrast.  CONTRAST:  29mL OMNIPAQUE IOHEXOL 300 MG/ML SOLN, 180mL OMNIPAQUE IOHEXOL 300 MG/ML SOLN  COMPARISON:  None.  FINDINGS: Lower chest: There is a moderate hiatal hernia with edema in the mucosa of the hernia as well some free fluid in the middle mediastinum around the edematous hernia. This could represent severe acute esophagitis/mediastinitis. Slight focal atelectasis or scarring at the lung bases posteriorly. Heart size is normal.  Hepatobiliary: Normal.  Pancreas: Normal.  Spleen: Normal.  Adrenals/Urinary Tract: Normal.  Stomach/Bowel: Normal including the terminal ileum and appendix.  Vascular/Lymphatic: Normal.  No adenopathy.  Reproductive: Normal.  Other: No free air or free fluid.  Musculoskeletal: Old compression fracture of L1.  Otherwise normal.  IMPRESSION: Moderate hiatal hernia with free fluid around the hernia in the middle mediastinum. This could represent  esophagitis/ mediastinitis and/or inflammation of the herniated gastric fundus. CT scan of the chest recommended for further evaluation of the mediastinum.   Electronically Signed   By: Lorriane Shire M.D.   On: 05/25/2015 21:39  [4 week]   Impression: 54 year old gentleman with schizophrenia, chronic hepatitis C who presents with 2 week history of upper abdominal pain associated with nausea and vomiting. CT of chest and abdomen with edema surrounding the esophagus and hiatal hernia associated with esophageal wall thickening and thickened wall of the hiatal hernia, infiltrative changes, air-fluid level seen within the mid thoracic esophagus with fluid filled distal esophagus. No evidence of perforation. Differential diagnosis includes severe complicated esophagitis, underlying malignancy.  Hypokalemia persistent but improved to 2.9. Magnesium level normal. KCL 39meq X 4 planned today.  Plan: 1. Discussed with Dr. Aviva Signs. Does not appear to be a surgical issue at this time. Patient will need an upper endoscopy for further evaluation. 2. Discussed with patient's nurse, Magda Paganini. Discontinue Lovenox which was scheduled for 10 AM today. Patient has not received any Lovenox this admission. 3. Continue NPO.  4. Continue IV PPI BID.  5. EGD this afternoon. Augment conscious sedation with Phenergan 25mg  IV 30 minutes before procedures given polypharmacy.   We would like to thank you for the opportunity to participate in the care of Danny Greene.  Laureen Ochs. Bernarda Caffey [facility] [phone] 9/22/20169:29 AM    LOS: 1 day   Attending note:  Patient seen and examined in short stay. CT reviewed. Agree with need for diagnostic EGD. Patient denies dysphagia. The risks, benefits, limitations, alternatives and imponderables have been reviewed with the patient. Potential for esophageal dilation, biopsy, etc. have also been reviewed.  Questions have been answered.  All parties agreeable.

## 2015-05-26 NOTE — Op Note (Signed)
[facility] [address] [place], [address]   2ENDOSCOPY PROCEDURE REPORT  PATIENT: Danny Greene, Danny Greene  MR#: [id number] BIRTHDATE: 12-20-1960 , 60  yrs. old GENDER: male ENDOSCOPIST: R.  Garfield Cornea, MD FACP [facility] REFERRED BY:  Drucilla Chalet FNP PROCEDURE DATE:  2015/06/04 PROCEDURE:  EGD w/ biopsy INDICATIONS:  Nausea and vomiting; severe GERD symptoms.  Abnormal GE junction on CT. MEDICATIONS: Versed 2 mg IV and Demerol 50 mg IV.  Zofran 4 mg IV. Phenergan 25 mg IV.  Xylocaine gel orally. ASA CLASS:      Class II  CONSENT: The risks, benefits, limitations, alternatives and imponderables have been discussed.  The potential for biopsy, esophogeal dilation, etc. have also been reviewed.  Questions have been answered.  All parties agreeable.  Please see the history and physical in the medical record for more information.  DESCRIPTION OF PROCEDURE: After the risks benefits and alternatives of the procedure were thoroughly explained, informed consent was obtained.  The EG-[id number] ([id number]) endoscope was introduced through the mouth and advanced to the second portion of the duodenum , limited by Without limitations. The instrument was slowly withdrawn as the mucosa was fully examined. Estimated blood loss is zero unless otherwise noted in this procedure report.    Distal one third of the tubular esophagus severely inflamed with diffuse ulceration/erosion overlying exudate.  Markedly friable mucosa?"oozed blood spontaneously.  No obvious tumor or Barrett's esophagus but would be difficult to tell with the degree of inflammation present.  EG junction easily traversed.  Stomach empty.  2 cm hiatal hernia.  Normal-appearing gastric mucosa. Patent pylorus.  Normal-appearing first and second portion of the duodenum.  The abnormal distal esophagus was biopsied.  Retroflexed views revealed a hiatal hernia.     The scope was then withdrawn from the patient and the  procedure completed.  COMPLICATIONS: There were no immediate complications. EBL 10 mL ENDOSCOPIC IMPRESSION: Severe ulcerative esophagitis as described. Status post biopsy. Hiatal hernia.  RECOMMENDATIONS: Clear liquid diet for now. Continue twice a day PPI - 3 months.  Add Carafate suspension 4 times a day -   2 weeks. Repeat EGD to assess esophageal mucosa in 3 months.  REPEAT EXAM:  eSigned:  R. Garfield Cornea, MD Rosalita Chessman [facility] 06/04/2015 3:27 PM    CC:  CPT CODES: ICD CODES:  The ICD and CPT codes recommended by this software are interpretations from the data that the clinical staff has captured with the software.  The verification of the translation of this report to the ICD and CPT codes and modifiers is the sole responsibility of the health care institution and practicing physician where this report was generated.  [employer] will not be held responsible for the validity of the ICD and CPT codes included on this report.  AMA assumes no liability for data contained or not contained herein. CPT is a Designer, television/film set of the [employer].  PATIENT NAME:  Danny Greene, Danny Greene MR#: [id number]

## 2015-05-27 ENCOUNTER — Encounter (HOSPITAL_COMMUNITY): Payer: Self-pay | Admitting: Internal Medicine

## 2015-05-27 DIAGNOSIS — R1033 Periumbilical pain: Secondary | ICD-10-CM | POA: Insufficient documentation

## 2015-05-27 LAB — CBC
HCT: 32.9 % — ABNORMAL LOW (ref 39.0–52.0)
Hemoglobin: 10.9 g/dL — ABNORMAL LOW (ref 13.0–17.0)
MCH: 32 pg (ref 26.0–34.0)
MCHC: 33.1 g/dL (ref 30.0–36.0)
MCV: 96.5 fL (ref 78.0–100.0)
Platelets: 211 K/uL (ref 150–400)
RBC: 3.41 MIL/uL — ABNORMAL LOW (ref 4.22–5.81)
RDW: 12.5 % (ref 11.5–15.5)
WBC: 8 K/uL (ref 4.0–10.5)

## 2015-05-27 LAB — BASIC METABOLIC PANEL
Anion gap: 4 — ABNORMAL LOW (ref 5–15)
BUN: 7 mg/dL (ref 6–20)
CALCIUM: 7.9 mg/dL — AB (ref 8.9–10.3)
CHLORIDE: 102 mmol/L (ref 101–111)
CO2: 31 mmol/L (ref 22–32)
CREATININE: 0.73 mg/dL (ref 0.61–1.24)
Glucose, Bld: 99 mg/dL (ref 65–99)
Potassium: 3 mmol/L — ABNORMAL LOW (ref 3.5–5.1)
SODIUM: 137 mmol/L (ref 135–145)

## 2015-05-27 NOTE — Progress Notes (Addendum)
Subjective: Clinically improved, pain is better but persistent. Tolerated clear liquids last night and this morning. Denies N/V, no bowel movement yesterday or today.  Objective: Vital signs in last 24 hours: Temp:  [97.5 F (36.4 C)-98.4 F (36.9 C)] 98.4 F (36.9 C) (09/23 0618) Pulse Rate:  [74-97] 74 (09/23 0618) Resp:  [13-24] 17 (09/23 0618) BP: (81-123)/(51-81) 87/57 mmHg (09/23 0618) SpO2:  [95 %-100 %] 95 % (09/23 0618) Weight:  [142 lb (64.411 kg)] 142 lb (64.411 kg) (09/22 1424) Last BM Date: 05/25/15 General:   Alert and oriented, pleasant Head:  Normocephalic and atraumatic. Eyes:  No icterus, sclera clear. Conjuctiva pink.  Heart:  S1, S2 present, no murmurs noted.  Lungs: Clear to auscultation bilaterally, without wheezing, rales, or rhonchi.  Abdomen:  Bowel sounds present, soft, non-tender, non-distended. No HSM or hernias noted. No rebound or guarding. No masses appreciated  Extremities:  Without clubbing or edema. Neurologic:  Alert and  oriented x4;  grossly normal neurologically. Skin:  Warm and dry, intact without significant lesions.  Psych:  Alert and cooperative. Normal mood and affect.  Intake/Output from previous day: 09/22 0701 - 09/23 0700 In: 1286.3 [I.V.:1286.3] Out: 200 [Urine:200] Intake/Output this shift:    Lab Results:  Recent Labs  05/25/15 2028 05/26/15 0741 05/27/15 0626  WBC 15.0* 13.4* 8.0  HGB 13.9 13.1 10.9*  HCT 39.8 38.2* 32.9*  PLT 266 264 211   BMET  Recent Labs  05/25/15 2028 05/26/15 0741 05/27/15 0626  NA 137 138 137  K 2.4* 2.9* 3.0*  CL 92* 96* 102  CO2 33* 35* 31  GLUCOSE 142* 126* 99  BUN 10 7 7   CREATININE 1.01 0.77 0.73  CALCIUM 8.9 8.5* 7.9*   LFT  Recent Labs  05/25/15 2028 05/26/15 0741  PROT 7.2 6.7  ALBUMIN 3.8 3.6  AST 20 15  ALT 12* 11*  ALKPHOS 105 99  BILITOT 0.6 0.6   PT/INR No results for input(s): LABPROT, INR in the last 72 hours. Hepatitis Panel No results for  input(s): HEPBSAG, HCVAB, HEPAIGM, HEPBIGM in the last 72 hours.   Studies/Results: Ct Chest W Contrast  05/25/2015   CLINICAL DATA:  Hiatal hernia, abnormal appearance of distal esophagus showing hiatal hernia with surrounding fluid mediastinum question esophagitis versus mediastinitis  EXAM: CT CHEST WITH CONTRAST  TECHNIQUE: Multidetector CT imaging of the chest was performed during intravenous contrast administration. Sagittal and coronal MPR images reconstructed from axial data set.  CONTRAST:  43mL OMNIPAQUE IOHEXOL 300 MG/ML  SOLN IV  COMPARISON:  CT abdomen pelvis 05/25/2015  FINDINGS: Thoracic vascular structures patent and unremarkable on nondedicated exam.  No thoracic adenopathy.  Diffuse wall thickening of the esophagus consistent with esophagitis.  Air-fluid level seen within mid thoracic esophagus with fluid filled distal esophagus.  Esophageal wall thickening becomes greater more distally in extends into a thickened wall of the hiatal hernia.  Infiltrate changes are seen in the retrocrural space and extending around the hiatal hernia.  Small lymph nodes within the retrocrural space.  No pneumomediastinum or pneumothorax.  Several small curvilinear vessels are seen within the paraesophageal fat question tiny varices.  Linear subsegmental atelectasis at both lung bases.  Lungs otherwise clear.  No pleural effusion or pneumothorax.  Visualized portion of upper abdomen otherwise grossly unremarkable.  IMPRESSION: Small hiatal hernia.  Diffusely thickened wall of the hiatal hernia as well as the mid to distal esophagus with mild infiltrative changes of the fat at retrocrural space and  inferior mediastinum adjacent to esophagus.  No evidence of esophageal perforation.  Findings are compatible with esophagitis.  The presence of edema surrounding the esophagus and hiatal hernia is abnormal for typical uncomplicated esophagitis; potentially this could be related to edema secondary to multiple episodes of  vomiting if the hiatal hernia repeatedly traversed the edge of the hiatus, severe atypical esophagitis, or even Crohn's disease.   Electronically Signed   By: Lavonia Dana M.D.   On: 05/25/2015 23:37   Ct Abdomen Pelvis W Contrast  05/25/2015   CLINICAL DATA:  Nausea and vomiting and epigastric pain tonight. Hepatitis-C.  EXAM: CT ABDOMEN AND PELVIS WITH CONTRAST  TECHNIQUE: Multidetector CT imaging of the abdomen and pelvis was performed using the standard protocol following bolus administration of intravenous contrast.  CONTRAST:  61mL OMNIPAQUE IOHEXOL 300 MG/ML SOLN, 158mL OMNIPAQUE IOHEXOL 300 MG/ML SOLN  COMPARISON:  None.  FINDINGS: Lower chest: There is a moderate hiatal hernia with edema in the mucosa of the hernia as well some free fluid in the middle mediastinum around the edematous hernia. This could represent severe acute esophagitis/mediastinitis. Slight focal atelectasis or scarring at the lung bases posteriorly. Heart size is normal.  Hepatobiliary: Normal.  Pancreas: Normal.  Spleen: Normal.  Adrenals/Urinary Tract: Normal.  Stomach/Bowel: Normal including the terminal ileum and appendix.  Vascular/Lymphatic: Normal.  No adenopathy.  Reproductive: Normal.  Other: No free air or free fluid.  Musculoskeletal: Old compression fracture of L1.  Otherwise normal.  IMPRESSION: Moderate hiatal hernia with free fluid around the hernia in the middle mediastinum. This could represent esophagitis/ mediastinitis and/or inflammation of the herniated gastric fundus. CT scan of the chest recommended for further evaluation of the mediastinum.   Electronically Signed   By: Lorriane Shire M.D.   On: 05/25/2015 21:39    Assessment: 54 year old gentleman with schizophrenia, chronic hepatitis C who presents with 2 week history of upper abdominal pain associated with nausea and vomiting. CT of chest and abdomen with edema surrounding the esophagus and hiatal hernia associated with esophageal wall thickening and  thickened wall of the hiatal hernia, infiltrative changes, air-fluid level seen within the mid thoracic esophagus with fluid filled distal esophagus. No evidence of perforation. Surgery has seen and no intervention needed at this time.  Hypokalemia persistent but improved to 3.0 today. WBC improved today, drop in hgb from 13.1 to 10.9. EGD yesterday found severe ulcerative esophagitis in the distal one-third of the esophagus with severe inflammation and diffuse ulceration/erosion overlying exudate, markedly friable mucosa with spontaneous oozing of blood noted, 2 cm hiatal hernia.  Pain improving (4/10 today compared to 6/10 on admission), tolerating clears thus far.   Plan: 1. Continue to monitor labs for worsening anemia. 2. Advance diet to full liquids and reassess for potential advancement this evening if tolerates well at lunch 3. Continue PPI per EGD recommendations: bid x 3 months 4. Carafate suspension qid x 2 weeks 5. Follow-up as outpatient and plan repeat EGD in 3 months.    Walden Field, AGNP-C Adult & Gerontological Nurse Practitioner [facility]    LOS: 2 days    05/27/2015, 9:07 AM     Attending note:  Patient seen and examined. He is feeling much better today. Agree with advancing diet. Continue twice a day PPI and Carafate. Biopsies will be out likely the first of the week

## 2015-05-27 NOTE — Progress Notes (Signed)
Notifed MD of low blood pressure. Received orders to increase fluid.  Patient has been stable, sleeping soundly all nights with no complaints, not tachycardic on telemetry.  Will continue to monitor patient.

## 2015-05-27 NOTE — Care Management Note (Signed)
Case Management Note  Patient Details  Name: Estus Krakowski MRN: [id number] Date of Birth: 10-Apr-1961  Subjective/Objective:                    Action/Plan:   Expected Discharge Date:                  Expected Discharge Plan:  Assisted Living / Rest Home  In-House Referral:  Clinical Social Work  Discharge planning Services  CM Consult  Post Acute Care Choice:  NA Choice offered to:  NA  DME Arranged:    DME Agency:     HH Arranged:    [place] Agency:     Status of Service:  Completed, signed off  Medicare Important Message Given:    Date Medicare IM Given:    Medicare IM give by:    Date Additional Medicare IM Given:    Additional Medicare Important Message give by:     If discussed at [place] of Stay Meetings, dates discussed:    Additional Comments: Anticipate discharge over the weekend to facility if tolerates advancing diet. CSW to arrange discharge to facility. Christinia Gully [place], RN 05/27/2015, 2:58 PM

## 2015-05-27 NOTE — Clinical Social Work Note (Signed)
CSW updated facility on pt. Aware of possible weekend d/c. CSW attempted to meet with pt again, but pt sleeping soundly.   Danny Greene, [place]

## 2015-05-27 NOTE — Clinical Documentation Improvement (Signed)
Hospitalist  Can the diagnosis of "worsening" anemia be further specified?   Acute Blood Loss Anemia, including the suspected or known cause or associated condition(s)  Acute on chronic blood loss anemia, including the suspected or known cause or associated condition(s)  Chronic blood loss anemia, including the suspected or known cause or associated condition(s)  Precipitous drop in Hematocrit, including the suspected or known cause or associated condition(s)  Other  Clinically Undetermined  Document any associated diagnoses/conditions.  Supporting Information: 05/27/15: progr note..."drop in hgb from 13.1 to 10.9"..."EDG...markedly friable mucosa with spontaneous oozing of blood noted,".Marland KitchenMarland KitchenContinue to monitor labs for worsening anemia.".Marland KitchenMarland Kitchen  Please exercise your independent, professional judgment when responding. A specific answer is not anticipated or expected.   Thank You,  Ermelinda Das, RN, BSN, CCDS Certified Clinical Documentation Specialist Pager: [place]: Health Information Management

## 2015-05-27 NOTE — Progress Notes (Addendum)
[facility] PROGRESS NOTE  Danny Greene CBS:[id number] DOB: 03/15/1961 DOA: 05/25/2015 PCP: Lauretta Grill, NP  Assessment/Plan: Ulcerative Esophagitis -As seen on EGD 9/22. -Tolerating liquids, per patient request will advance to a soft diet. -Continue BID PPI and sucralfate. -Appreciate ID input and recommendations.  Acute Blood Loss Anemia -2/2 oozing from severe ulcerative esophagitis. -No indication for transfusion.  Hypokalemia -Continue to replete.  Schizophrenia -Mood is stable. -If tolerates soft solid diet, will consider restarting mood-stabilizing meds.  Tobacco Abuse -Declined a nicotine patch. -Smoking cessation counseling provided.  Code Status: Full Code Family Communication: Patient only  Disposition Plan: Home when ready; anticipate 1-3 days.   Consultants:  GI   Antibiotics:  None   Subjective: Feels better, pain improved; wants to eat solid food.  Objective: Filed Vitals:   05/27/15 0012 05/27/15 0100 05/27/15 0618 05/27/15 1448  BP: 81/51 94/60 87/57  86/49  Pulse:  83 74 81  Temp: 98 F (36.7 C)  98.4 F (36.9 C) 98 F (36.7 C)  TempSrc: Oral  Oral Oral  Resp: 15  17 18   Height:      Weight:      SpO2: 100%  95% 98%    Intake/Output Summary (Last 24 hours) at 05/27/15 1604 Last data filed at 05/27/15 1230  Gross per 24 hour  Intake 1766.25 ml  Output    200 ml  Net 1566.25 ml   Filed Weights   05/25/15 2002 05/26/15 0110 05/26/15 1424  Weight: 64.547 kg (142 lb 4.8 oz) 64.456 kg (142 lb 1.6 oz) 64.411 kg (142 lb)    Exam:   General:  AA Ox3  Cardiovascular: RRR  Respiratory: CTA B  Abdomen: S/NT/ND/+BS  Extremities: no C/C/E   Neurologic:  Non-focal  Data Reviewed: Basic Metabolic Panel:  Recent Labs Lab 05/25/15 2028 05/26/15 0741 05/27/15 0626  NA 137 138 137  K 2.4* 2.9* 3.0*  CL 92* 96* 102  CO2 33* 35* 31  GLUCOSE 142* 126* 99  BUN 10 7 7   CREATININE 1.01 0.77 0.73  CALCIUM  8.9 8.5* 7.9*  MG  --  2.1  --    Liver Function Tests:  Recent Labs Lab 05/25/15 2028 05/26/15 0741  AST 20 15  ALT 12* 11*  ALKPHOS 105 99  BILITOT 0.6 0.6  PROT 7.2 6.7  ALBUMIN 3.8 3.6    Recent Labs Lab 05/25/15 2028  LIPASE 29   No results for input(s): AMMONIA in the last 168 hours. CBC:  Recent Labs Lab 05/25/15 2028 05/26/15 0741 05/27/15 0626  WBC 15.0* 13.4* 8.0  NEUTROABS 11.7*  --   --   HGB 13.9 13.1 10.9*  HCT 39.8 38.2* 32.9*  MCV 94.8 94.8 96.5  PLT 266 264 211   Cardiac Enzymes: No results for input(s): CKTOTAL, CKMB, CKMBINDEX, TROPONINI in the last 168 hours. BNP (last 3 results) No results for input(s): BNP in the last 8760 hours.  ProBNP (last 3 results) No results for input(s): PROBNP in the last 8760 hours.  CBG:  Recent Labs Lab 05/26/15 1248  GLUCAP 104*    No results found for this or any previous visit (from the past 240 hour(s)).   Studies: Ct Chest W Contrast  05/25/2015   CLINICAL DATA:  Hiatal hernia, abnormal appearance of distal esophagus showing hiatal hernia with surrounding fluid mediastinum question esophagitis versus mediastinitis  EXAM: CT CHEST WITH CONTRAST  TECHNIQUE: Multidetector CT imaging of the chest was performed during intravenous contrast administration. Sagittal  and coronal MPR images reconstructed from axial data set.  CONTRAST:  71mL OMNIPAQUE IOHEXOL 300 MG/ML  SOLN IV  COMPARISON:  CT abdomen pelvis 05/25/2015  FINDINGS: Thoracic vascular structures patent and unremarkable on nondedicated exam.  No thoracic adenopathy.  Diffuse wall thickening of the esophagus consistent with esophagitis.  Air-fluid level seen within mid thoracic esophagus with fluid filled distal esophagus.  Esophageal wall thickening becomes greater more distally in extends into a thickened wall of the hiatal hernia.  Infiltrate changes are seen in the retrocrural space and extending around the hiatal hernia.  Small lymph nodes within  the retrocrural space.  No pneumomediastinum or pneumothorax.  Several small curvilinear vessels are seen within the paraesophageal fat question tiny varices.  Linear subsegmental atelectasis at both lung bases.  Lungs otherwise clear.  No pleural effusion or pneumothorax.  Visualized portion of upper abdomen otherwise grossly unremarkable.  IMPRESSION: Small hiatal hernia.  Diffusely thickened wall of the hiatal hernia as well as the mid to distal esophagus with mild infiltrative changes of the fat at retrocrural space and inferior mediastinum adjacent to esophagus.  No evidence of esophageal perforation.  Findings are compatible with esophagitis.  The presence of edema surrounding the esophagus and hiatal hernia is abnormal for typical uncomplicated esophagitis; potentially this could be related to edema secondary to multiple episodes of vomiting if the hiatal hernia repeatedly traversed the edge of the hiatus, severe atypical esophagitis, or even Crohn's disease.   Electronically Signed   By: Lavonia Dana M.D.   On: 05/25/2015 23:37   Ct Abdomen Pelvis W Contrast  05/25/2015   CLINICAL DATA:  Nausea and vomiting and epigastric pain tonight. Hepatitis-C.  EXAM: CT ABDOMEN AND PELVIS WITH CONTRAST  TECHNIQUE: Multidetector CT imaging of the abdomen and pelvis was performed using the standard protocol following bolus administration of intravenous contrast.  CONTRAST:  14mL OMNIPAQUE IOHEXOL 300 MG/ML SOLN, 121mL OMNIPAQUE IOHEXOL 300 MG/ML SOLN  COMPARISON:  None.  FINDINGS: Lower chest: There is a moderate hiatal hernia with edema in the mucosa of the hernia as well some free fluid in the middle mediastinum around the edematous hernia. This could represent severe acute esophagitis/mediastinitis. Slight focal atelectasis or scarring at the lung bases posteriorly. Heart size is normal.  Hepatobiliary: Normal.  Pancreas: Normal.  Spleen: Normal.  Adrenals/Urinary Tract: Normal.  Stomach/Bowel: Normal including the  terminal ileum and appendix.  Vascular/Lymphatic: Normal.  No adenopathy.  Reproductive: Normal.  Other: No free air or free fluid.  Musculoskeletal: Old compression fracture of L1.  Otherwise normal.  IMPRESSION: Moderate hiatal hernia with free fluid around the hernia in the middle mediastinum. This could represent esophagitis/ mediastinitis and/or inflammation of the herniated gastric fundus. CT scan of the chest recommended for further evaluation of the mediastinum.   Electronically Signed   By: Lorriane Shire M.D.   On: 05/25/2015 21:39    Scheduled Meds: . pantoprazole (PROTONIX) IV  40 mg Intravenous Q12H  . sucralfate  1 g Oral TID WC & HS   Continuous Infusions: . 0.9 % NaCl with KCl 20 mEq / L 125 mL/hr at 05/27/15 1601    Principal Problem:   Abdominal pain Active Problems:   Hypokalemia   Abnormal CT scan, esophagus   Hiatal hernia   Esophagitis   Schizophrenia, unspecified type   Tobacco abuse   Reflux esophagitis   Acute esophagitis   Periumbilical abdominal pain    Time spent: 25 minutes. Greater than 50% of this time was  spent in direct contact with the patient coordinating care.    Lelon Frohlich  [facility] Pager [phone]  If 7PM-7AM, please contact night-coverage at [email or web address], password [facility] 05/27/2015, 4:04 PM  LOS: 2 days

## 2015-05-28 LAB — BASIC METABOLIC PANEL
Anion gap: 5 (ref 5–15)
BUN: 5 mg/dL — ABNORMAL LOW (ref 6–20)
CALCIUM: 7.9 mg/dL — AB (ref 8.9–10.3)
CO2: 24 mmol/L (ref 22–32)
CREATININE: 0.66 mg/dL (ref 0.61–1.24)
Chloride: 110 mmol/L (ref 101–111)
GFR calc non Af Amer: >60 mL/min (ref >60)
Glucose, Bld: 95 mg/dL (ref 65–99)
Potassium: 3.6 mmol/L (ref 3.5–5.1)
SODIUM: 139 mmol/L (ref 135–145)

## 2015-05-28 LAB — CBC
HCT: 32.5 % — ABNORMAL LOW (ref 39.0–52.0)
Hemoglobin: 11.1 g/dL — ABNORMAL LOW (ref 13.0–17.0)
MCH: 32.3 pg (ref 26.0–34.0)
MCHC: 34.2 g/dL (ref 30.0–36.0)
MCV: 94.5 fL (ref 78.0–100.0)
PLATELETS: 187 10*3/uL (ref 150–400)
RBC: 3.44 MIL/uL — AB (ref 4.22–5.81)
RDW: 12.2 % (ref 11.5–15.5)
WBC: 7.1 10*3/uL (ref 4.0–10.5)

## 2015-05-28 MED ORDER — PANTOPRAZOLE SODIUM 40 MG PO TBEC: 40.0000 mg | DELAYED_RELEASE_TABLET | Freq: Two times a day (BID) | ORAL | Status: DC

## 2015-05-28 MED ORDER — SUCRALFATE 1 GM/10ML PO SUSP: 1.0000 g | Freq: Three times a day (TID) | ORAL | Status: DC

## 2015-05-28 NOTE — Discharge Summary (Signed)
Physician Discharge Summary  Danny Greene YIR:[id number] DOB: 1961-01-02 DOA: 05/25/2015  PCP: Lauretta Grill, NP  Admit date: 05/25/2015 Discharge date: 05/28/2015  Time spent: 45 minutes  Recommendations for Outpatient Follow-up:  -Will be discharged home today. -Advised to follow p with Dr. Sydell Axon in 2 weeks for biopsy results.   Discharge Diagnoses:  Principal Problem:   Abdominal pain Active Problems:   Hypokalemia   Abnormal CT scan, esophagus   Hiatal hernia   Esophagitis   Schizophrenia, unspecified type   Tobacco abuse   Reflux esophagitis   Acute esophagitis   Periumbilical abdominal pain   Discharge Condition: Stable and improved  Filed Weights   05/25/15 2002 05/26/15 0110 05/26/15 1424  Weight: 64.547 kg (142 lb 4.8 oz) 64.456 kg (142 lb 1.6 oz) 64.411 kg (142 lb)    History of present illness:  As per Dr. Darrick Meigs 20/35: 54 year old male who  has a past medical history of Schizophrenia; Hepatitis C; Hypertension; GERD (gastroesophageal reflux disease); and Constipation. Today presents to the hospital with complaints of upper abdominal pain, nausea and vomiting for past 2 weeks. Patient has history of schizophrenia and is currently residing in a group home. In the ED CT abdomen pelvis revealed inflammation around the esophagus/hiatal hernia/mediastinal inflammation. Gen. surgery was consulted by the ED physician, Dr. Arnoldo Morale to follow the patient. He also admits to having intermittent chest pain, no fever. Denies shortness of breath  Hospital Course:   Ulcerative Esophagitis -As seen on EGD 9/22. -Tolerating soft diet without issues. -Continue BID PPI and sucralfate. -Appreciate GI input and recommendations.  Acute Blood Loss Anemia -2/2 oozing from severe ulcerative esophagitis. -No indication for transfusion.  Hypokalemia -Repleted.  Schizophrenia -Mood is stable. -Restart psych meds.  Tobacco Abuse -Declined a nicotine  patch. -Smoking cessation counseling provided.   Procedures:  EGD   Consultations:  GI  Discharge Instructions  Discharge Instructions    Diet - low sodium heart healthy    Complete by:  As directed      Increase activity slowly    Complete by:  As directed             Medication List    STOP taking these medications        omeprazole 20 MG capsule  Commonly known as:  PRILOSEC     ondansetron 4 MG tablet  Commonly known as:  ZOFRAN     potassium chloride SA 20 MEQ tablet  Commonly known as:  K-DUR,KLOR-CON     Vitamin D 2000 UNITS tablet      TAKE these medications        clozapine 200 MG tablet  Commonly known as:  CLOZARIL  Take 200 mg by mouth daily.     fluvoxaMINE 100 MG tablet  Commonly known as:  LUVOX  Take 100 mg by mouth at bedtime. sleep     ondansetron 4 MG disintegrating tablet  Commonly known as:  ZOFRAN-ODT  Place 8 mg under the tongue 2 (two) times daily.     pantoprazole 40 MG tablet  Commonly known as:  PROTONIX  Take 1 tablet (40 mg total) by mouth 2 (two) times daily.     Potassium Chloride 40 MEQ/15ML (20%) Soln  Take 15 mLs by mouth at bedtime. Take dose before bed tonight     senna 8.6 MG Tabs tablet  Commonly known as:  SENOKOT  Take 2 tablets by mouth at bedtime.     sucralfate 1 GM/10ML suspension  Commonly known as:  CARAFATE  Take 10 mLs (1 g total) by mouth 4 (four) times daily -  with meals and at bedtime.       No Known Allergies     Follow-up Information    Follow up with Manus Rudd, MD. Call in 2 weeks.   Specialty:  Gastroenterology   Contact information:   [address] [place] [address] [phone]        The results of significant diagnostics from this hospitalization (including imaging, microbiology, ancillary and laboratory) are listed below for reference.    Significant Diagnostic Studies: Dg Chest 2 View  05/13/2015   CLINICAL DATA:  Weakness, dizziness and nausea today.  Weight  loss.  EXAM: CHEST  2 VIEW  COMPARISON:  Single view of the chest 12/02/2014.  FINDINGS: The lungs are clear. Heart size is normal. No pneumothorax or pleural effusion. No focal bony abnormality  IMPRESSION: Negative chest   Electronically Signed   By: Inge Rise M.D.   On: 05/13/2015 15:36   Ct Chest W Contrast  05/25/2015   CLINICAL DATA:  Hiatal hernia, abnormal appearance of distal esophagus showing hiatal hernia with surrounding fluid mediastinum question esophagitis versus mediastinitis  EXAM: CT CHEST WITH CONTRAST  TECHNIQUE: Multidetector CT imaging of the chest was performed during intravenous contrast administration. Sagittal and coronal MPR images reconstructed from axial data set.  CONTRAST:  59mL OMNIPAQUE IOHEXOL 300 MG/ML  SOLN IV  COMPARISON:  CT abdomen pelvis 05/25/2015  FINDINGS: Thoracic vascular structures patent and unremarkable on nondedicated exam.  No thoracic adenopathy.  Diffuse wall thickening of the esophagus consistent with esophagitis.  Air-fluid level seen within mid thoracic esophagus with fluid filled distal esophagus.  Esophageal wall thickening becomes greater more distally in extends into a thickened wall of the hiatal hernia.  Infiltrate changes are seen in the retrocrural space and extending around the hiatal hernia.  Small lymph nodes within the retrocrural space.  No pneumomediastinum or pneumothorax.  Several small curvilinear vessels are seen within the paraesophageal fat question tiny varices.  Linear subsegmental atelectasis at both lung bases.  Lungs otherwise clear.  No pleural effusion or pneumothorax.  Visualized portion of upper abdomen otherwise grossly unremarkable.  IMPRESSION: Small hiatal hernia.  Diffusely thickened wall of the hiatal hernia as well as the mid to distal esophagus with mild infiltrative changes of the fat at retrocrural space and inferior mediastinum adjacent to esophagus.  No evidence of esophageal perforation.  Findings are  compatible with esophagitis.  The presence of edema surrounding the esophagus and hiatal hernia is abnormal for typical uncomplicated esophagitis; potentially this could be related to edema secondary to multiple episodes of vomiting if the hiatal hernia repeatedly traversed the edge of the hiatus, severe atypical esophagitis, or even Crohn's disease.   Electronically Signed   By: Lavonia Dana M.D.   On: 05/25/2015 23:37   Ct Abdomen Pelvis W Contrast  05/25/2015   CLINICAL DATA:  Nausea and vomiting and epigastric pain tonight. Hepatitis-C.  EXAM: CT ABDOMEN AND PELVIS WITH CONTRAST  TECHNIQUE: Multidetector CT imaging of the abdomen and pelvis was performed using the standard protocol following bolus administration of intravenous contrast.  CONTRAST:  28mL OMNIPAQUE IOHEXOL 300 MG/ML SOLN, 139mL OMNIPAQUE IOHEXOL 300 MG/ML SOLN  COMPARISON:  None.  FINDINGS: Lower chest: There is a moderate hiatal hernia with edema in the mucosa of the hernia as well some free fluid in the middle mediastinum around the edematous hernia. This could represent  severe acute esophagitis/mediastinitis. Slight focal atelectasis or scarring at the lung bases posteriorly. Heart size is normal.  Hepatobiliary: Normal.  Pancreas: Normal.  Spleen: Normal.  Adrenals/Urinary Tract: Normal.  Stomach/Bowel: Normal including the terminal ileum and appendix.  Vascular/Lymphatic: Normal.  No adenopathy.  Reproductive: Normal.  Other: No free air or free fluid.  Musculoskeletal: Old compression fracture of L1.  Otherwise normal.  IMPRESSION: Moderate hiatal hernia with free fluid around the hernia in the middle mediastinum. This could represent esophagitis/ mediastinitis and/or inflammation of the herniated gastric fundus. CT scan of the chest recommended for further evaluation of the mediastinum.   Electronically Signed   By: Lorriane Shire M.D.   On: 05/25/2015 21:39    Microbiology: No results found for this or any previous visit (from the  past 240 hour(s)).   Labs: Basic Metabolic Panel:  Recent Labs Lab 05/25/15 2028 05/26/15 0741 05/27/15 0626 05/28/15 0655  NA 137 138 137 139  K 2.4* 2.9* 3.0* 3.6  CL 92* 96* 102 110  CO2 33* 35* 31 24  GLUCOSE 142* 126* 99 95  BUN 10 7 7  5*  CREATININE 1.01 0.77 0.73 0.66  CALCIUM 8.9 8.5* 7.9* 7.9*  MG  --  2.1  --   --    Liver Function Tests:  Recent Labs Lab 05/25/15 2028 05/26/15 0741  AST 20 15  ALT 12* 11*  ALKPHOS 105 99  BILITOT 0.6 0.6  PROT 7.2 6.7  ALBUMIN 3.8 3.6    Recent Labs Lab 05/25/15 2028  LIPASE 29   No results for input(s): AMMONIA in the last 168 hours. CBC:  Recent Labs Lab 05/25/15 2028 05/26/15 0741 05/27/15 0626 05/28/15 0655  WBC 15.0* 13.4* 8.0 7.1  NEUTROABS 11.7*  --   --   --   HGB 13.9 13.1 10.9* 11.1*  HCT 39.8 38.2* 32.9* 32.5*  MCV 94.8 94.8 96.5 94.5  PLT 266 264 211 187   Cardiac Enzymes: No results for input(s): CKTOTAL, CKMB, CKMBINDEX, TROPONINI in the last 168 hours. BNP: BNP (last 3 results) No results for input(s): BNP in the last 8760 hours.  ProBNP (last 3 results) No results for input(s): PROBNP in the last 8760 hours.  CBG:  Recent Labs Lab 05/26/15 1248  GLUCAP 104*       Signed:  HERNANDEZ ACOSTA,ESTELA  [facility] Pager: [phone] 05/28/2015, 11:00 AM

## 2015-05-28 NOTE — Progress Notes (Signed)
Pt discharged to [facility] today per Dr. Jerilee Hoh.  Pt's IV site d/c'd and WDL.  Pt's VSS.  Transporter from [facility] provided with FL2 packet, discharge handouts and prescriptions verbalized understanding.  Pt ambulated off floor in stable condition accompanied by NT.

## 2015-05-31 ENCOUNTER — Encounter: Payer: Self-pay | Admitting: Internal Medicine

## 2015-06-01 ENCOUNTER — Encounter: Payer: Self-pay | Admitting: Internal Medicine

## 2015-06-01 ENCOUNTER — Telehealth: Payer: Self-pay

## 2015-06-01 NOTE — Telephone Encounter (Signed)
Letter mailed to the pt. 

## 2015-06-01 NOTE — Telephone Encounter (Signed)
Per RMR- Send letter to patient.  Send copy of letter with path to referring provider and PCP.   Needs office visit in 3 months to set up repeat EGD

## 2015-06-01 NOTE — Telephone Encounter (Signed)
APPT MADE AND LETTER SENT  °

## 2015-06-09 ENCOUNTER — Telehealth: Payer: Self-pay | Admitting: Internal Medicine

## 2015-06-09 ENCOUNTER — Emergency Department (HOSPITAL_COMMUNITY): Payer: Medicaid Other

## 2015-06-09 ENCOUNTER — Encounter (HOSPITAL_COMMUNITY): Payer: Self-pay | Admitting: Emergency Medicine

## 2015-06-09 ENCOUNTER — Inpatient Hospital Stay (HOSPITAL_COMMUNITY)
Admission: EM | Admit: 2015-06-09 | Discharge: 2015-06-11 | DRG: 641 | Disposition: A | Discharge status: Nursing Home (Includes ICF) | Payer: Medicaid Other | Attending: Internal Medicine | Admitting: Internal Medicine

## 2015-06-09 DIAGNOSIS — B1920 Unspecified viral hepatitis C without hepatic coma: Secondary | ICD-10-CM | POA: Diagnosis present

## 2015-06-09 DIAGNOSIS — F209 Schizophrenia, unspecified: Secondary | ICD-10-CM | POA: Diagnosis present

## 2015-06-09 DIAGNOSIS — E876 Hypokalemia: Principal | ICD-10-CM | POA: Diagnosis present

## 2015-06-09 DIAGNOSIS — R112 Nausea with vomiting, unspecified: Secondary | ICD-10-CM | POA: Diagnosis present

## 2015-06-09 DIAGNOSIS — I10 Essential (primary) hypertension: Secondary | ICD-10-CM | POA: Diagnosis present

## 2015-06-09 DIAGNOSIS — K2210 Ulcer of esophagus without bleeding: Secondary | ICD-10-CM | POA: Diagnosis not present

## 2015-06-09 DIAGNOSIS — E860 Dehydration: Secondary | ICD-10-CM | POA: Diagnosis present

## 2015-06-09 DIAGNOSIS — G43A1 Cyclical vomiting, intractable: Secondary | ICD-10-CM | POA: Diagnosis not present

## 2015-06-09 DIAGNOSIS — F17210 Nicotine dependence, cigarettes, uncomplicated: Secondary | ICD-10-CM | POA: Diagnosis present

## 2015-06-09 DIAGNOSIS — R1110 Vomiting, unspecified: Secondary | ICD-10-CM | POA: Diagnosis not present

## 2015-06-09 DIAGNOSIS — Z23 Encounter for immunization: Secondary | ICD-10-CM | POA: Diagnosis not present

## 2015-06-09 LAB — COMPREHENSIVE METABOLIC PANEL
ALT: 14 U/L — AB (ref 17–63)
ANION GAP: 16 — AB (ref 5–15)
AST: 27 U/L (ref 15–41)
Albumin: 3.9 g/dL (ref 3.5–5.0)
Alkaline Phosphatase: 104 U/L (ref 38–126)
BUN: 19 mg/dL (ref 6–20)
CHLORIDE: 86 mmol/L — AB (ref 101–111)
CO2: 30 mmol/L (ref 22–32)
Calcium: 9 mg/dL (ref 8.9–10.3)
Creatinine, Ser: 1.16 mg/dL (ref 0.61–1.24)
GFR calc non Af Amer: >60 mL/min (ref >60)
Glucose, Bld: 104 mg/dL — ABNORMAL HIGH (ref 65–99)
Potassium: 2.4 mmol/L — CL (ref 3.5–5.1)
SODIUM: 132 mmol/L — AB (ref 135–145)
Total Bilirubin: 1.1 mg/dL (ref 0.3–1.2)
Total Protein: 7.6 g/dL (ref 6.5–8.1)

## 2015-06-09 LAB — MAGNESIUM: Magnesium: 2.3 mg/dL (ref 1.7–2.4)

## 2015-06-09 LAB — CBC WITH DIFFERENTIAL/PLATELET
Basophils Absolute: 0 10*3/uL (ref 0.0–0.1)
Basophils Relative: 0 %
EOS ABS: 0 10*3/uL (ref 0.0–0.7)
EOS PCT: 0 %
HCT: 41.3 % (ref 39.0–52.0)
Hemoglobin: 14.3 g/dL (ref 13.0–17.0)
LYMPHS ABS: 1.8 10*3/uL (ref 0.7–4.0)
Lymphocytes Relative: 16 %
MCH: 32.6 pg (ref 26.0–34.0)
MCHC: 34.6 g/dL (ref 30.0–36.0)
MCV: 94.3 fL (ref 78.0–100.0)
Monocytes Absolute: 1.1 10*3/uL — ABNORMAL HIGH (ref 0.1–1.0)
Monocytes Relative: 10 %
Neutro Abs: 8.3 10*3/uL — ABNORMAL HIGH (ref 1.7–7.7)
Neutrophils Relative %: 74 %
PLATELETS: 274 10*3/uL (ref 150–400)
RBC: 4.38 MIL/uL (ref 4.22–5.81)
RDW: 12.5 % (ref 11.5–15.5)
WBC: 11.2 10*3/uL — ABNORMAL HIGH (ref 4.0–10.5)

## 2015-06-09 LAB — URINALYSIS, ROUTINE W REFLEX MICROSCOPIC
Glucose, UA: NEGATIVE mg/dL
KETONES UR: 40 mg/dL — AB
LEUKOCYTES UA: NEGATIVE
NITRITE: NEGATIVE
Specific Gravity, Urine: 1.02 (ref 1.005–1.030)
Urobilinogen, UA: 0.2 mg/dL (ref 0.0–1.0)
pH: 6 (ref 5.0–8.0)

## 2015-06-09 LAB — URINE MICROSCOPIC-ADD ON

## 2015-06-09 LAB — LIPASE, BLOOD: Lipase: 24 U/L (ref 22–51)

## 2015-06-09 LAB — TROPONIN I

## 2015-06-09 MED ORDER — SENNA 8.6 MG PO TABS
2.0000 | ORAL_TABLET | Freq: Every day | ORAL | Status: DC
  Administered 2015-06-09 – 2015-06-10 (×2): 17.2 mg via ORAL
  Filled 2015-06-09 (×2): qty 2

## 2015-06-09 MED ORDER — POTASSIUM CHLORIDE 10 MEQ/100ML IV SOLN
10.0000 meq | INTRAVENOUS | Status: AC
  Administered 2015-06-09 (×3): 10 meq via INTRAVENOUS
  Filled 2015-06-09 (×3): qty 100

## 2015-06-09 MED ORDER — FLUVOXAMINE MALEATE 50 MG PO TABS
100.0000 mg | ORAL_TABLET | Freq: Every day | ORAL | Status: DC
  Administered 2015-06-09 – 2015-06-10 (×2): 100 mg via ORAL
  Filled 2015-06-09 (×3): qty 2
  Filled 2015-06-09: qty 1

## 2015-06-09 MED ORDER — POTASSIUM CHLORIDE IN NACL 40-0.9 MEQ/L-% IV SOLN
INTRAVENOUS | Status: DC
  Administered 2015-06-09 – 2015-06-10 (×2): 75 mL/h via INTRAVENOUS

## 2015-06-09 MED ORDER — POTASSIUM CHLORIDE CRYS ER 20 MEQ PO TBCR
40.0000 meq | EXTENDED_RELEASE_TABLET | Freq: Once | ORAL | Status: AC
  Administered 2015-06-09: 40 meq via ORAL
  Filled 2015-06-09: qty 2

## 2015-06-09 MED ORDER — GI COCKTAIL ~~LOC~~
30.0000 mL | Freq: Once | ORAL | Status: AC
  Administered 2015-06-09: 30 mL via ORAL

## 2015-06-09 MED ORDER — ENOXAPARIN SODIUM 40 MG/0.4ML ~~LOC~~ SOLN: 40.0000 mg | SUBCUTANEOUS | Status: DC

## 2015-06-09 MED ORDER — PANTOPRAZOLE SODIUM 40 MG IV SOLR
40.0000 mg | Freq: Two times a day (BID) | INTRAVENOUS | Status: DC
  Administered 2015-06-09 – 2015-06-11 (×4): 40 mg via INTRAVENOUS
  Filled 2015-06-09 (×4): qty 40

## 2015-06-09 MED ORDER — ONDANSETRON HCL 4 MG/2ML IJ SOLN: 4.0000 mg | Freq: Four times a day (QID) | INTRAMUSCULAR | Status: DC | PRN

## 2015-06-09 MED ORDER — PANTOPRAZOLE SODIUM 40 MG IV SOLR
40.0000 mg | Freq: Once | INTRAVENOUS | Status: AC
  Administered 2015-06-09: 40 mg via INTRAVENOUS
  Filled 2015-06-09: qty 40

## 2015-06-09 MED ORDER — SUCRALFATE 1 GM/10ML PO SUSP
1.0000 g | Freq: Three times a day (TID) | ORAL | Status: DC
  Administered 2015-06-09 – 2015-06-11 (×7): 1 g via ORAL
  Filled 2015-06-09 (×8): qty 10

## 2015-06-09 MED ORDER — ENOXAPARIN SODIUM 40 MG/0.4ML ~~LOC~~ SOLN
40.0000 mg | SUBCUTANEOUS | Status: DC
  Administered 2015-06-09 – 2015-06-10 (×2): 40 mg via SUBCUTANEOUS
  Filled 2015-06-09 (×2): qty 0.4

## 2015-06-09 MED ORDER — SODIUM CHLORIDE 0.9 % IV BOLUS (SEPSIS)
1000.0000 mL | Freq: Once | INTRAVENOUS | Status: AC
  Administered 2015-06-09: 1000 mL via INTRAVENOUS

## 2015-06-09 MED ORDER — ONDANSETRON HCL 4 MG PO TABS: 4.0000 mg | ORAL_TABLET | Freq: Four times a day (QID) | ORAL | Status: DC | PRN

## 2015-06-09 MED ORDER — ONDANSETRON HCL 4 MG/2ML IJ SOLN
4.0000 mg | Freq: Once | INTRAMUSCULAR | Status: AC
  Administered 2015-06-09: 4 mg via INTRAVENOUS
  Filled 2015-06-09: qty 2

## 2015-06-09 MED ORDER — SODIUM CHLORIDE 0.9 % IJ SOLN: 3.0000 mL | Freq: Two times a day (BID) | INTRAMUSCULAR | Status: DC

## 2015-06-09 MED ORDER — CLOZAPINE 100 MG PO TABS
200.0000 mg | ORAL_TABLET | Freq: Every day | ORAL | Status: DC
  Administered 2015-06-10 – 2015-06-11 (×2): 200 mg via ORAL
  Filled 2015-06-09 (×4): qty 2

## 2015-06-09 MED ORDER — SODIUM CHLORIDE 0.9 % IV SOLN: INTRAVENOUS | Status: DC

## 2015-06-09 NOTE — Telephone Encounter (Signed)
Dorna Bloom (caregiver) called to see if patient could be seen before 12/28. Patient had EGD on 9/22 and RMR had stated for patient to return in 3 months to set up repeat EGD. I offered 10/25, but caregiver seems really concerned about the patient and said he isn't able to eat, throat and stomach is burning, patient is hurting. I told her that I could send a note to the nurse to let her be aware what is happening and see what recommendations RMR would have. Please advise [phone]

## 2015-06-09 NOTE — H&P (Signed)
[facility] History and Physical  Danny Greene WVP:[id number] DOB: 05-13-1961 DOA: 06/09/2015  Referring physician: ER PCP: Lauretta Grill, NP   Chief Complaint: Intractable vomiting.  HPI: Danny Greene is a 54 y.o. male  This is a 54 year old man who has a history of schizophrenia and lives in a group home. He presents with a two-week history of poor by mouth intake and nausea and vomiting. He also describes lower abdominal pain. He says he has had diarrhea also. However, there is no rectal bleeding, black stools, hematemesis. He denies any chest pain, palpitations or dyspnea. He denies any fever. Evaluation in the emergency room showed him to be hypokalemic and in view of the intractable nausea and vomiting, he is being admitted for further investigation and management.   Review of Systems:  Apart from symptoms above, all systems are negative.  Past Medical History  Diagnosis Date  . Schizophrenia ([place])   . Hepatitis C   . Hypertension   . GERD (gastroesophageal reflux disease)   . Constipation    Past Surgical History  Procedure Laterality Date  . Cholecystectomy    . Kidney stone surgery    . Esophagogastroduodenoscopy N/A 05/26/2015    Procedure: ESOPHAGOGASTRODUODENOSCOPY (EGD);  Surgeon: Daneil Dolin, MD;  [facility];  Service: Endoscopy;  Laterality: N/A;  . Esophageal dilation N/A 05/26/2015    Procedure: ESOPHAGEAL DILATION;  Surgeon: Daneil Dolin, MD;  Location[facility];  Service: Endoscopy;  Laterality: N/A;   Social History:  reports that he has been smoking Cigarettes.  He has been smoking about 1.00 pack per day. He does not have any smokeless tobacco history on file. He reports that he does not drink alcohol or use illicit drugs.  No Known Allergies  Family History  Problem Relation Age of Onset  . Liver disease Neg Hx   . Colon cancer Neg Hx     Prior to Admission medications   Medication Sig Start Date End Date  Taking? Authorizing Provider  clozapine (CLOZARIL) 200 MG tablet Take 200 mg by mouth daily.   Yes Historical Provider, MD  fluvoxaMINE (LUVOX) 100 MG tablet Take 100 mg by mouth at bedtime. sleep   Yes Historical Provider, MD  ondansetron (ZOFRAN-ODT) 4 MG disintegrating tablet Place 8 mg under the tongue 2 (two) times daily.   Yes Historical Provider, MD  pantoprazole (PROTONIX) 40 MG tablet Take 1 tablet (40 mg total) by mouth 2 (two) times daily. 05/28/15  Yes Erline Hau, MD  senna (SENOKOT) 8.6 MG TABS tablet Take 2 tablets by mouth at bedtime.   Yes Historical Provider, MD  sucralfate (CARAFATE) 1 GM/10ML suspension Take 10 mLs (1 g total) by mouth 4 (four) times daily -  with meals and at bedtime. 05/28/15  Yes Erline Hau, MD  Potassium Chloride 40 MEQ/15ML (20%) SOLN Take 15 mLs by mouth at bedtime. Take dose before bed tonight Patient not taking: Reported on 06/09/2015 05/13/15   Orbie Pyo, MD   Physical Exam: Filed Vitals:   06/09/15 1430 06/09/15 1500 06/09/15 1630 06/09/15 1700  BP: 103/80 108/84 114/78 126/86  Pulse: 99 94 103 93  Temp:      TempSrc:      Resp: 18 13    Height:      Weight:      SpO2: 98% 100% 100% 99%    Wt Readings from Last 3 Encounters:  06/09/15 64.411 kg (142 lb)  05/26/15  64.411 kg (142 lb)  05/13/15 66.225 kg (146 lb)    General:  Appears clinically dehydrated. His blood pressure is acceptable. Eyes: PERRL, normal lids, irises & conjunctiva ENT: grossly normal hearing, lips & tongue Neck: no LAD, masses or thyromegaly Cardiovascular: RRR, no m/r/g. No LE edema. Telemetry: SR, no arrhythmias  Respiratory: CTA bilaterally, no w/r/r. Normal respiratory effort. Abdomen: soft, ntnd Skin: no rash or induration seen on limited exam Musculoskeletal: grossly normal tone BUE/BLE Psychiatric: grossly normal mood and affect, speech fluent and appropriate Neurologic: grossly non-focal.          Labs on  Admission:  Basic Metabolic Panel:  Recent Labs Lab 06/09/15 1417  NA 132*  K 2.4*  CL 86*  CO2 30  GLUCOSE 104*  BUN 19  CREATININE 1.16  CALCIUM 9.0  MG 2.3   Liver Function Tests:  Recent Labs Lab 06/09/15 1417  AST 27  ALT 14*  ALKPHOS 104  BILITOT 1.1  PROT 7.6  ALBUMIN 3.9    Recent Labs Lab 06/09/15 1417  LIPASE 24   No results for input(s): AMMONIA in the last 168 hours. CBC:  Recent Labs Lab 06/09/15 1417  WBC 11.2*  NEUTROABS 8.3*  HGB 14.3  HCT 41.3  MCV 94.3  PLT 274   Cardiac Enzymes:  Recent Labs Lab 06/09/15 1417  TROPONINI <0.03    BNP (last 3 results) No results for input(s): BNP in the last 8760 hours.  ProBNP (last 3 results) No results for input(s): PROBNP in the last 8760 hours.  CBG: No results for input(s): GLUCAP in the last 168 hours.  Radiological Exams on Admission: Dg Abd Acute W/chest  06/09/2015   CLINICAL DATA:  Nausea, vomiting and diarrhea for 4 days, generalized abdominal pain for 2 weeks, mild shortness of breath, history hypertension, kidney stones, cholecystectomy, hepatitis-C, smoker  EXAM: DG ABDOMEN ACUTE W/ 1V CHEST  COMPARISON:  05/13/2015 chest radiographs, CT abdomen and pelvis 05/25/2015  FINDINGS: Normal heart size, mediastinal contours, and pulmonary vascularity.  Lungs hyperinflated but clear.  No pulmonary infiltrate, pleural effusion or pneumothorax.  Bones appear diffusely demineralized.  Nonobstructive bowel gas pattern.  Scattered gas throughout large and small bowel loops which are nondistended.  Gas and stool in rectosigmoid colon.  No bowel dilatation, bowel wall thickening or free intraperitoneal air.  Compression deformity superior endplate L1, chronic.  No definite urinary tract calcification.  IMPRESSION: Normal bowel gas pattern.  Hyperinflated lungs question COPD.  Chronic superior endplate compression fracture of L1.   Electronically Signed   By: Lavonia Dana M.D.   On: 06/09/2015 15:02      Assessment/Plan   1. Hypokalemia. This is related to his intractable nausea and vomiting and possibly diarrhea. This will be repleted. 2. Dehydration. IV fluids. 3. Intractable nausea and vomiting with abdominal pain. The etiology is not entirely clear. He has a soft abdomen by exam and does not appear to have an acute abdomen. There are no masses felt. Consider gastroenterology consultation if his symptoms do not resolve. 4. Schizophrenia.  He'll be admitted to telemetry. Further recommendations will depend on patient's hospital progress.   Code Status: Full code.  DVT Prophylaxis: Lovenox.  Family Communication: I discussed the plan with the patient at the bedside.   Disposition Plan: Back to the group home when medically stable.   Time spent: 45 minutes.  Doree Albee [facility] Pager [phone].

## 2015-06-09 NOTE — ED Notes (Signed)
Pt is from [facility].  Has been c/o nausea and upset stomach since Sunday.  Last vomited yesterday.  Denies pain at this time but is c/o nausea.

## 2015-06-09 NOTE — ED Provider Notes (Signed)
CSN: [id number]     Arrival date & time 06/09/15  1358 History   First MD Initiated Contact with Patient 06/09/15 1405     Chief Complaint  Patient presents with  . GI Problem     (Consider location/radiation/quality/duration/timing/severity/associated sxs/prior Treatment) HPI Comments: Level V caveat patient is poor historian. He is from a group home with ongoing abdominal pain, nausea and vomiting states not able to eat or drink for the past 3 days. Unable to keep medications down. Complains of "upset stomach". No history of fevers. Last episode of vomiting yesterday. Recent admission 2 weeks ago for esophagitis which included CT scan and EGD. States he is having continued diarrhea that is nonbloody. Endorses crampy epigastric abdominal pain that is constant. Associated with nausea and vomiting. Endorses poor appetite. Endorses lightheadedness with standing.  Patient is a 54 y.o. male presenting with GI illness. The history is provided by the patient and the EMS personnel. The history is limited by the condition of the patient.  GI Problem Associated symptoms include abdominal pain. Pertinent negatives include no chest pain and no shortness of breath.    Past Medical History  Diagnosis Date  . Schizophrenia ([place])   . Hepatitis C   . Hypertension   . GERD (gastroesophageal reflux disease)   . Constipation    Past Surgical History  Procedure Laterality Date  . Cholecystectomy    . Kidney stone surgery    . Esophagogastroduodenoscopy N/A 05/26/2015    Procedure: ESOPHAGOGASTRODUODENOSCOPY (EGD);  Surgeon: Daneil Dolin, MD;  [facility];  Service: Endoscopy;  Laterality: N/A;  . Esophageal dilation N/A 05/26/2015    Procedure: ESOPHAGEAL DILATION;  Surgeon: Daneil Dolin, MD;  [facility];  Service: Endoscopy;  Laterality: N/A;   Family History  Problem Relation Age of Onset  . Liver disease Neg Hx   . Colon cancer Neg Hx    Social History  Substance Use  Topics  . Smoking status: Current Every Day Smoker -- 1.00 packs/day    Types: Cigarettes  . Smokeless tobacco: None  . Alcohol Use: No    Review of Systems  Constitutional: Positive for activity change, appetite change and fatigue. Negative for fever.  HENT: Negative for congestion and rhinorrhea.   Eyes: Negative for visual disturbance.  Respiratory: Negative for cough, chest tightness and shortness of breath.   Cardiovascular: Negative for chest pain.  Gastrointestinal: Positive for nausea, vomiting, abdominal pain and diarrhea. Negative for blood in stool.  Genitourinary: Negative for dysuria, urgency and hematuria.  Musculoskeletal: Negative for myalgias, arthralgias and neck pain.  Skin: Negative for wound.  Neurological: Positive for weakness and light-headedness. Negative for dizziness.  A complete 10 system review of systems was obtained and all systems are negative except as noted in the HPI and PMH.      Allergies  Review of patient's allergies indicates no known allergies.  Home Medications   Prior to Admission medications   Medication Sig Start Date End Date Taking? Authorizing Provider  clozapine (CLOZARIL) 200 MG tablet Take 200 mg by mouth daily.   Yes Historical Provider, MD  fluvoxaMINE (LUVOX) 100 MG tablet Take 100 mg by mouth at bedtime. sleep   Yes Historical Provider, MD  ondansetron (ZOFRAN-ODT) 4 MG disintegrating tablet Place 8 mg under the tongue 2 (two) times daily.   Yes Historical Provider, MD  pantoprazole (PROTONIX) 40 MG tablet Take 1 tablet (40 mg total) by mouth 2 (two) times daily. 05/28/15  Yes  Erline Hau, MD  senna (SENOKOT) 8.6 MG TABS tablet Take 2 tablets by mouth at bedtime.   Yes Historical Provider, MD  sucralfate (CARAFATE) 1 GM/10ML suspension Take 10 mLs (1 g total) by mouth 4 (four) times daily -  with meals and at bedtime. 05/28/15  Yes Erline Hau, MD  Potassium Chloride 40 MEQ/15ML (20%) SOLN Take 15 mLs  by mouth at bedtime. Take dose before bed tonight Patient not taking: Reported on 06/09/2015 05/13/15   Orbie Pyo, MD   BP 103/89 mmHg  Pulse 89  Temp(Src) 98.4 F (36.9 C) (Oral)  Resp 16  Ht 5\' 7"  (1.702 m)  Wt 137 lb 6.4 oz (62.324 kg)  BMI 21.51 kg/m2  SpO2 99% Physical Exam  Constitutional: He is oriented to person, place, and time. He appears well-developed and well-nourished. No distress.  HENT:  Head: Normocephalic and atraumatic.  Mouth/Throat: Oropharynx is clear and moist. No oropharyngeal exudate.  Eyes: Conjunctivae and EOM are normal. Pupils are equal, round, and reactive to light.  Neck: Normal range of motion. Neck supple.  No meningismus.  Cardiovascular: Normal rate, normal heart sounds and intact distal pulses.   No murmur heard. Tachycardia 110s  Pulmonary/Chest: Effort normal and breath sounds normal. No respiratory distress.  Abdominal: Soft. There is tenderness. There is no rebound and no guarding.  Epigastric tenderness  Musculoskeletal: Normal range of motion. He exhibits no edema or tenderness.  Neurological: He is alert and oriented to person, place, and time. No cranial nerve deficit. He exhibits normal muscle tone. Coordination normal.  No ataxia on finger to nose bilaterally. No pronator drift. 5/5 strength throughout. CN 2-12 intact. Negative Romberg. Equal grip strength. Sensation intact. Gait is normal.   Skin: Skin is warm.  Psychiatric: He has a normal mood and affect. His behavior is normal.  Nursing note and vitals reviewed.   ED Course  Procedures (including critical care time) Labs Review Labs Reviewed  CBC WITH DIFFERENTIAL/PLATELET - Abnormal; Notable for the following:    WBC 11.2 (*)    Neutro Abs 8.3 (*)    Monocytes Absolute 1.1 (*)    All other components within normal limits  COMPREHENSIVE METABOLIC PANEL - Abnormal; Notable for the following:    Sodium 132 (*)    Potassium 2.4 (*)    Chloride 86 (*)    Glucose,  Bld 104 (*)    ALT 14 (*)    Anion gap 16 (*)    All other components within normal limits  URINALYSIS, ROUTINE W REFLEX MICROSCOPIC (NOT AT [facility]) - Abnormal; Notable for the following:    Hgb urine dipstick TRACE (*)    Bilirubin Urine SMALL (*)    Ketones, ur 40 (*)    Protein, ur TRACE (*)    All other components within normal limits  URINE MICROSCOPIC-ADD ON - Abnormal; Notable for the following:    Squamous Epithelial / LPF FEW (*)    Casts GRANULAR CAST (*)    All other components within normal limits  LIPASE, BLOOD  TROPONIN I  MAGNESIUM  COMPREHENSIVE METABOLIC PANEL  CBC    Imaging Review Dg Abd Acute W/chest  06/09/2015   CLINICAL DATA:  Nausea, vomiting and diarrhea for 4 days, generalized abdominal pain for 2 weeks, mild shortness of breath, history hypertension, kidney stones, cholecystectomy, hepatitis-C, smoker  EXAM: DG ABDOMEN ACUTE W/ 1V CHEST  COMPARISON:  05/13/2015 chest radiographs, CT abdomen and pelvis 05/25/2015  FINDINGS: Normal heart  size, mediastinal contours, and pulmonary vascularity.  Lungs hyperinflated but clear.  No pulmonary infiltrate, pleural effusion or pneumothorax.  Bones appear diffusely demineralized.  Nonobstructive bowel gas pattern.  Scattered gas throughout large and small bowel loops which are nondistended.  Gas and stool in rectosigmoid colon.  No bowel dilatation, bowel wall thickening or free intraperitoneal air.  Compression deformity superior endplate L1, chronic.  No definite urinary tract calcification.  IMPRESSION: Normal bowel gas pattern.  Hyperinflated lungs question COPD.  Chronic superior endplate compression fracture of L1.   Electronically Signed   By: Lavonia Dana M.D.   On: 06/09/2015 15:02   I have personally reviewed and evaluated these images and lab results as part of my medical decision-making.   EKG Interpretation   Date/Time:  Thursday June 09 2015 14:18:36 EDT Ventricular Rate:  111 PR Interval:  147 QRS  Duration: 83 QT Interval:  382 QTC Calculation: 519 R Axis:   -29 Text Interpretation:  Sinus tachycardia Atrial premature complexes  Borderline left axis deviation Prolonged QT interval Prolonged QT  Confirmed by Wyvonnia Dusky  MD, Illya Gienger [phone]) on 06/09/2015 2:44:47 PM      MDM   Final diagnoses:  Hypokalemia  Nausea and vomiting, vomiting of unspecified type    ongoing abdominal pain with nausea and vomiting and inability to tolerate by mouth. Recent hospitalization for ulcerative esophagitis. States unable to keep his medicines down.   Tachycardic on arrival. Abdomen soft.   Labs show hypokalemia 2.4.   Patient given IV fluids, antiemetics, IV and by mouth potassium.   with ongoing nausea and vomiting and severe hypokalemia, will admit for rehydration and correction of electrolytes. No evidence of ongoing acute GI bleed. D/w Dr. Anastasio Champion.   Ezequiel Essex, MD 06/09/15 [phone]

## 2015-06-09 NOTE — Telephone Encounter (Signed)
Spoke with the caregiver- she said pt has been c/o epigastric burning, vomitng and not eating well. She is not sure about his bm's . He is taking protonix bid and carafate qas and hs. They sent pt to [facility] ED. They just wanted RMR to be aware of what was going on.

## 2015-06-10 DIAGNOSIS — E860 Dehydration: Secondary | ICD-10-CM

## 2015-06-10 DIAGNOSIS — K2210 Ulcer of esophagus without bleeding: Secondary | ICD-10-CM

## 2015-06-10 DIAGNOSIS — R1110 Vomiting, unspecified: Secondary | ICD-10-CM

## 2015-06-10 DIAGNOSIS — F209 Schizophrenia, unspecified: Secondary | ICD-10-CM

## 2015-06-10 DIAGNOSIS — E876 Hypokalemia: Principal | ICD-10-CM

## 2015-06-10 LAB — COMPREHENSIVE METABOLIC PANEL
ALT: 10 U/L — ABNORMAL LOW (ref 17–63)
ANION GAP: 6 (ref 5–15)
AST: 15 U/L (ref 15–41)
Albumin: 3 g/dL — ABNORMAL LOW (ref 3.5–5.0)
Alkaline Phosphatase: 74 U/L (ref 38–126)
BUN: 14 mg/dL (ref 6–20)
CHLORIDE: 98 mmol/L — AB (ref 101–111)
CO2: 30 mmol/L (ref 22–32)
Calcium: 8 mg/dL — ABNORMAL LOW (ref 8.9–10.3)
Creatinine, Ser: 0.83 mg/dL (ref 0.61–1.24)
GFR calc non Af Amer: >60 mL/min (ref >60)
Glucose, Bld: 96 mg/dL (ref 65–99)
POTASSIUM: 3.2 mmol/L — AB (ref 3.5–5.1)
SODIUM: 134 mmol/L — AB (ref 135–145)
Total Bilirubin: 0.6 mg/dL (ref 0.3–1.2)
Total Protein: 5.6 g/dL — ABNORMAL LOW (ref 6.5–8.1)

## 2015-06-10 LAB — CBC
HCT: 34.8 % — ABNORMAL LOW (ref 39.0–52.0)
HEMOGLOBIN: 11.7 g/dL — AB (ref 13.0–17.0)
MCH: 32.1 pg (ref 26.0–34.0)
MCHC: 33.6 g/dL (ref 30.0–36.0)
MCV: 95.3 fL (ref 78.0–100.0)
Platelets: 237 10*3/uL (ref 150–400)
RBC: 3.65 MIL/uL — AB (ref 4.22–5.81)
RDW: 12.5 % (ref 11.5–15.5)
WBC: 8.3 10*3/uL (ref 4.0–10.5)

## 2015-06-10 MED ORDER — POTASSIUM CHLORIDE CRYS ER 20 MEQ PO TBCR
40.0000 meq | EXTENDED_RELEASE_TABLET | ORAL | Status: AC
  Administered 2015-06-10 (×2): 40 meq via ORAL
  Filled 2015-06-10 (×2): qty 2

## 2015-06-10 NOTE — Consult Note (Signed)
Referring Provider: No ref. provider found Primary Care Physician:  Lauretta Grill, NP Primary Gastroenterologist:  Dr. Gala Romney  Date of Admission: 06/09/15 Date of Consultation: 06/10/15  Reason for Consultation:  Intractable N/V, poor po intake  HPI:  54 year old male group home resident with a history of schizophrenia and severe ulcerative esophagitis presented to the ER with a two-week history of poor po intake, intractable nausea and vomiting. Also complained of lower abdominal pain in the ER and non-bloody diarrhea. Was found to be significantly hypokalemic in the ER and was admitted for potassium replacement, hydration, and further evaluation. On ER admission Hgb was 14.3, which decreased to 11.7 this morning. His baseline over the past 3 weeks post-EGD has been 10.9-11.1.  Today he states he is not really having any abdominal pain. Has been having nausea and vomiting for the past 2 weeks, averaging 2-3 episodes of emesis per day. Is taking his medications as prescribed. Nausea is worse after eating and has had decreased intake subsequently. Denies hematemesis, hematochezia, melena. States overall his pain and nausea are improved today. Last episode of emesis about 2 days ago. Last bowel movement about 2 days ago.  Past Medical History  Diagnosis Date  . Schizophrenia ([place])   . Hepatitis C   . Hypertension   . GERD (gastroesophageal reflux disease)   . Constipation     Past Surgical History  Procedure Laterality Date  . Cholecystectomy    . Kidney stone surgery    . Esophagogastroduodenoscopy N/A 05/26/2015    Procedure: ESOPHAGOGASTRODUODENOSCOPY (EGD);  Surgeon: Daneil Dolin, MD;  [facility];  Service: Endoscopy;  Laterality: N/A;  . Esophageal dilation N/A 05/26/2015    Procedure: ESOPHAGEAL DILATION;  Surgeon: Daneil Dolin, MD;  Location: [facility];  Service: Endoscopy;  Laterality: N/A;    Prior to Admission medications   Medication Sig Start Date End  Date Taking? Authorizing Provider  clozapine (CLOZARIL) 200 MG tablet Take 200 mg by mouth daily.   Yes Historical Provider, MD  fluvoxaMINE (LUVOX) 100 MG tablet Take 100 mg by mouth at bedtime. sleep   Yes Historical Provider, MD  ondansetron (ZOFRAN-ODT) 4 MG disintegrating tablet Place 8 mg under the tongue 2 (two) times daily.   Yes Historical Provider, MD  pantoprazole (PROTONIX) 40 MG tablet Take 1 tablet (40 mg total) by mouth 2 (two) times daily. 05/28/15  Yes Erline Hau, MD  senna (SENOKOT) 8.6 MG TABS tablet Take 2 tablets by mouth at bedtime.   Yes Historical Provider, MD  sucralfate (CARAFATE) 1 GM/10ML suspension Take 10 mLs (1 g total) by mouth 4 (four) times daily -  with meals and at bedtime. 05/28/15  Yes Erline Hau, MD  Potassium Chloride 40 MEQ/15ML (20%) SOLN Take 15 mLs by mouth at bedtime. Take dose before bed tonight Patient not taking: Reported on 06/09/2015 05/13/15   Orbie Pyo, MD    Current Facility-Administered Medications  Medication Dose Route Frequency Provider Last Rate Last Dose  . 0.9 % NaCl with KCl 40 mEq / L  infusion   Intravenous Continuous Doree Albee, MD 75 mL/hr at 06/09/15 1840 75 mL/hr at 06/09/15 1840  . cloZAPine (CLOZARIL) tablet 200 mg  200 mg Oral Daily Nimish C Gosrani, MD      . enoxaparin (LOVENOX) injection 40 mg  40 mg Subcutaneous Q24H Nimish C Anastasio Champion, MD   40 mg at 06/09/15 2202  . fluvoxaMINE (LUVOX) tablet 100 mg  100 mg Oral QHS Doree Albee, MD   100 mg at 06/09/15 2202  . ondansetron (ZOFRAN) tablet 4 mg  4 mg Oral Q6H PRN Nimish Luther Parody, MD       Or  . ondansetron (ZOFRAN) injection 4 mg  4 mg Intravenous Q6H PRN Nimish C Gosrani, MD      . pantoprazole (PROTONIX) injection 40 mg  40 mg Intravenous Q12H Doree Albee, MD   40 mg at 06/09/15 2207  . senna (SENOKOT) tablet 17.2 mg  2 tablet Oral QHS Doree Albee, MD   17.2 mg at 06/09/15 2202  . sodium chloride 0.9 % injection  3 mL  3 mL Intravenous Q12H Nimish C Gosrani, MD   3 mL at 06/09/15 2200  . sucralfate (CARAFATE) 1 GM/10ML suspension 1 g  1 g Oral TID WC & HS Nimish C Gosrani, MD   1 g at 06/10/15 0850    Allergies as of 06/09/2015  . (No Known Allergies)    Family History  Problem Relation Age of Onset  . Liver disease Neg Hx   . Colon cancer Neg Hx     Social History   Social History  . Marital Status: Single    Spouse Name: N/A  . Number of Children: N/A  . Years of Education: N/A   Occupational History  . Not on file.   Social History Main Topics  . Smoking status: Current Every Day Smoker -- 1.00 packs/day    Types: Cigarettes  . Smokeless tobacco: Not on file  . Alcohol Use: No  . Drug Use: No  . Sexual Activity: Not Currently   Other Topics Concern  . Not on file   Social History Narrative    Review of Systems: Gen: Denies fever, chills. CV: Denies chest pain, heart palpitations, syncope, edema  Resp: Denies shortness of breath with rest GI: See HPI.  Heme: Denies bruising, bleeding.  Physical Exam: Vital signs in last 24 hours: Temp:  [97.7 F (36.5 C)-98.4 F (36.9 C)] 98.2 F (36.8 C) (10/07 0559) Pulse Rate:  [84-116] 84 (10/07 0559) Resp:  [13-18] 16 (10/07 0559) BP: (103-126)/(61-89) 110/64 mmHg (10/07 0559) SpO2:  [95 %-100 %] 95 % (10/07 0559) Weight:  [137 lb 6.4 oz (62.324 kg)-142 lb (64.411 kg)] 137 lb 6.4 oz (62.324 kg) (10/06 1754) Last BM Date: 06/09/15 General:   Sleeping but easily awakens to voice and is then alert;  Well-developed, well-nourished, pleasant and cooperative in NAD Head:  Normocephalic and atraumatic. Eyes:  Sclera clear, no icterus. Conjunctiva pink. Ears:  Normal auditory acuity. Lungs:  Clear throughout to auscultation. No acute distress. Heart:  Regular rate and rhythm; no murmurs, clicks, rubs,  or gallops. Abdomen:  Soft, nontender and nondistended. No masses, hepatosplenomegaly or hernias noted. Normal bowel sounds,  without guarding, and without rebound.   Rectal:  Deferred.   Extremities:  Without clubbing or edema. Neurologic:  Alert and  oriented x4;  grossly normal neurologically.   Intake/Output from previous day: 10/06 0701 - 10/07 0700 In: 240 [P.O.:240] Out: 750 [Urine:750] Intake/Output this shift:    Lab Results:  Recent Labs  06/09/15 1417 06/10/15 0544  WBC 11.2* 8.3  HGB 14.3 11.7*  HCT 41.3 34.8*  PLT 274 237   BMET  Recent Labs  06/09/15 1417 06/10/15 0544  NA 132* 134*  K 2.4* 3.2*  CL 86* 98*  CO2 30 30  GLUCOSE 104* 96  BUN 19 14  CREATININE 1.16  0.83  CALCIUM 9.0 8.0*   LFT  Recent Labs  06/09/15 1417 06/10/15 0544  PROT 7.6 5.6*  ALBUMIN 3.9 3.0*  AST 27 15  ALT 14* 10*  ALKPHOS 104 74  BILITOT 1.1 0.6   PT/INR No results for input(s): LABPROT, INR in the last 72 hours. Hepatitis Panel No results for input(s): HEPBSAG, HCVAB, HEPAIGM, HEPBIGM in the last 72 hours. C-Diff No results for input(s): CDIFFTOX in the last 72 hours.  Studies/Results: Dg Abd Acute W/chest  06/09/2015   CLINICAL DATA:  Nausea, vomiting and diarrhea for 4 days, generalized abdominal pain for 2 weeks, mild shortness of breath, history hypertension, kidney stones, cholecystectomy, hepatitis-C, smoker  EXAM: DG ABDOMEN ACUTE W/ 1V CHEST  COMPARISON:  05/13/2015 chest radiographs, CT abdomen and pelvis 05/25/2015  FINDINGS: Normal heart size, mediastinal contours, and pulmonary vascularity.  Lungs hyperinflated but clear.  No pulmonary infiltrate, pleural effusion or pneumothorax.  Bones appear diffusely demineralized.  Nonobstructive bowel gas pattern.  Scattered gas throughout large and small bowel loops which are nondistended.  Gas and stool in rectosigmoid colon.  No bowel dilatation, bowel wall thickening or free intraperitoneal air.  Compression deformity superior endplate L1, chronic.  No definite urinary tract calcification.  IMPRESSION: Normal bowel gas pattern.   Hyperinflated lungs question COPD.  Chronic superior endplate compression fracture of L1.   Electronically Signed   By: Lavonia Dana M.D.   On: 06/09/2015 15:02    Impression: 54 year old male with a history of severe erosive and ulcerative esophagitis on EGD dated 05/26/15. Presents with persistent N/V, worse with eating. Denies abdominal pain. Is currently on PPI and Carafate. Also receiving GI cocktail while inpatient. Symptoms are improved today. Is scheduled for 3 month repeat EGD to re-evaluate esophagitis. Potassium depleted from GI losses, has received replacement and potassium today is 3.2 (2.4 yesterday). No overt GI bleed noted. Did have a drop in Hgb from 14.3 to 11.7, althoguh 117 is within his recent baseline. At least a component of this is likely due to his previously dehydrated state and inpatient rehydration.  Plan: 1. Continue antiemetics as needed for nausea 2. Continue supportive measures 3. No need for urgent EGD at this time, unless further complications 4. Continue to monitor for overt GI bleed for continued decline in Hgb 5. Keep plan for follow-up outpatient appointment with planned surveillance EGD subsequently. 6. Avoid irritating po potassium supplements due to extensive esophageal erosion and ulceration.    Walden Field, AGNP-C Adult & Gerontological Nurse Practitioner [facility]    LOS: 1 day     06/10/2015, 9:29 AM

## 2015-06-10 NOTE — Care Management Note (Signed)
Case Management Note  Patient Details  Name: Danny Greene MRN: [id number] Date of Birth: 1961-01-06  Expected Discharge Date:                  Expected Discharge Plan:  Assisted Living / Rest Home  In-House Referral:  Clinical Social Work  Discharge planning Services  CM Consult  Post Acute Care Choice:  NA Choice offered to:  NA  DME Arranged:    DME Agency:     HH Arranged:    [place] Agency:     Status of Service:  Completed, signed off  Medicare Important Message Given:    Date Medicare IM Given:    Medicare IM give by:    Date Additional Medicare IM Given:    Additional Medicare Important Message give by:     If discussed at [place] of Stay Meetings, dates discussed:    Additional Comments: Pt admitted with hypokalemia. Pt is from [facility]. Pt requires assitance with ADL's and ambulation. Anticipate return to [facility] at [place] is aware and will work with pt and facility to arrange for return when [facility]. No CM needs noted.  Sherald Barge, RN 06/10/2015, 1:15 PM

## 2015-06-10 NOTE — Progress Notes (Signed)
[facility] PROGRESS NOTE  Danny Greene YKD:[id number] DOB: 1961/04/02 DOA: 06/09/2015 PCP: Lauretta Grill, NP  Assessment/Plan: Hypokalemia -2/2 GI losses. -Continue to replete today. -Mg ok at 2.3.  N/v -Improved. -2/2 ulcerative esophagitis.  Ulcerative Esophagitis -Continue BID PPI, sucralfate. -Seen on EGD during prior admission.  Schizophrenia -Mood stable. -Continue psychotropic medications.    Code Status: Full Code Family Communication: Patient only  Disposition Plan: Back to group home in 24-48 hours depending on symptom improvement.   Consultants:  None   Antibiotics:  None   Subjective: Feels improved. No actual emesis since admission. Has been able to tolerate a solid diet.  Objective: Filed Vitals:   06/09/15 1700 06/09/15 1754 06/09/15 2025 06/10/15 0559  BP: 126/86 103/89 104/61 110/64  Pulse: 93 89 99 84  Temp:  98.4 F (36.9 C) 97.9 F (36.6 C) 98.2 F (36.8 C)  TempSrc:  Oral Oral Oral  Resp:  16 16 16   Height:  5\' 7"  (1.702 m)    Weight:  62.324 kg (137 lb 6.4 oz)    SpO2: 99% 99% 96% 95%    Intake/Output Summary (Last 24 hours) at 06/10/15 0959 Last data filed at 06/10/15 0559  Gross per 24 hour  Intake    240 ml  Output    750 ml  Net   -510 ml   Filed Weights   06/09/15 1403 06/09/15 1754  Weight: 64.411 kg (142 lb) 62.324 kg (137 lb 6.4 oz)    Exam:   General:  AA Ox3  Cardiovascular: RRR  Respiratory: CTA B  Abdomen: S/NT/ND/+BS  Extremities: no C/C/E   Neurologic:  Intact and non-focal  Data Reviewed: Basic Metabolic Panel:  Recent Labs Lab 06/09/15 1417 06/10/15 0544  NA 132* 134*  K 2.4* 3.2*  CL 86* 98*  CO2 30 30  GLUCOSE 104* 96  BUN 19 14  CREATININE 1.16 0.83  CALCIUM 9.0 8.0*  MG 2.3  --    Liver Function Tests:  Recent Labs Lab 06/09/15 1417 06/10/15 0544  AST 27 15  ALT 14* 10*  ALKPHOS 104 74  BILITOT 1.1 0.6  PROT 7.6 5.6*  ALBUMIN 3.9 3.0*     Recent Labs Lab 06/09/15 1417  LIPASE 24   No results for input(s): AMMONIA in the last 168 hours. CBC:  Recent Labs Lab 06/09/15 1417 06/10/15 0544  WBC 11.2* 8.3  NEUTROABS 8.3*  --   HGB 14.3 11.7*  HCT 41.3 34.8*  MCV 94.3 95.3  PLT 274 237   Cardiac Enzymes:  Recent Labs Lab 06/09/15 1417  TROPONINI <0.03   BNP (last 3 results) No results for input(s): BNP in the last 8760 hours.  ProBNP (last 3 results) No results for input(s): PROBNP in the last 8760 hours.  CBG: No results for input(s): GLUCAP in the last 168 hours.  No results found for this or any previous visit (from the past 240 hour(s)).   Studies: Dg Abd Acute W/chest  06/09/2015   CLINICAL DATA:  Nausea, vomiting and diarrhea for 4 days, generalized abdominal pain for 2 weeks, mild shortness of breath, history hypertension, kidney stones, cholecystectomy, hepatitis-C, smoker  EXAM: DG ABDOMEN ACUTE W/ 1V CHEST  COMPARISON:  05/13/2015 chest radiographs, CT abdomen and pelvis 05/25/2015  FINDINGS: Normal heart size, mediastinal contours, and pulmonary vascularity.  Lungs hyperinflated but clear.  No pulmonary infiltrate, pleural effusion or pneumothorax.  Bones appear diffusely demineralized.  Nonobstructive bowel gas pattern.  Scattered gas throughout large  and small bowel loops which are nondistended.  Gas and stool in rectosigmoid colon.  No bowel dilatation, bowel wall thickening or free intraperitoneal air.  Compression deformity superior endplate L1, chronic.  No definite urinary tract calcification.  IMPRESSION: Normal bowel gas pattern.  Hyperinflated lungs question COPD.  Chronic superior endplate compression fracture of L1.   Electronically Signed   By: Lavonia Dana M.D.   On: 06/09/2015 15:02    Scheduled Meds: . clozapine  200 mg Oral Daily  . enoxaparin (LOVENOX) injection  40 mg Subcutaneous Q24H  . fluvoxaMINE  100 mg Oral QHS  . pantoprazole (PROTONIX) IV  40 mg Intravenous Q12H  .  senna  2 tablet Oral QHS  . sodium chloride  3 mL Intravenous Q12H  . sucralfate  1 g Oral TID WC & HS   Continuous Infusions: . 0.9 % NaCl with KCl 40 mEq / L 75 mL/hr (06/09/15 1840)    Active Problems:   Hypokalemia   Schizophrenia, unspecified type (HCC)   Intractable vomiting with nausea   Dehydration    Time spent: 25 minutes. Greater than 50% of this time was spent in direct contact with the patient coordinating care.    Lelon Frohlich  [facility] Pager [phone]  If 7PM-7AM, please contact night-coverage at [email or web address], password [facility] 06/10/2015, 9:59 AM  LOS: 1 day

## 2015-06-10 NOTE — Telephone Encounter (Signed)
Noted  

## 2015-06-10 NOTE — Clinical Social Work Note (Signed)
Clinical Social Work Assessment  Patient Details  Name: Danny Greene MRN: [id number] Date of Birth: 1961-08-31  Date of referral:  06/10/15               Reason for consult:  Facility Placement                Permission sought to share information with:    Permission granted to share information::     Name::        Agency::     Relationship::     Contact Information:     Housing/Transportation Living arrangements for the past 2 months:  D[facility] of Information:  Patient, Facility Patient Interpreter Needed:  None Criminal Activity/Legal Involvement Pertinent to Current Situation/Hospitalization:  No - Comment as needed Significant Relationships:  Other(Comment) (facility) Lives with:  Facility Resident Do you feel safe going back to the place where you live?  Yes Need for family participation in patient care:   (no family)  Care giving concerns:  Pt is resident at group home.    Social Worker assessment / plan:  CSW met with pt at bedside. Pt alert and oriented and reports he has been a resident at [facility] facility for about a year. He considers this his home now. Pt has no family involved. He requests to return to [facility] at d/c. Per [facility] at facility, pt does not have any home health. He requires assist with all ADLs and staff ambulate with pt for safety. [facility] reports pt has had poor intake and weight loss recently which contributed to ED visit. [facility] team sees pt at facility twice a week. Okay for return. [facility] is aware of probable weekend d/c.   Employment status:    Insurance information:  Medicaid In [place] PT Recommendations:  Not assessed at this time Information / Referral to community resources:  Other (Comment Required) (return to facility)  Patient/Family's Response to care:  Pt requests to return to [facility] at d/c.   Patient/Family's Understanding of and Emotional Response to Diagnosis, Current Treatment, and Prognosis:  Pt  reports some understanding regarding admission diagnosis and treatment plan.   Emotional Assessment Appearance:  Appears stated age Attitude/Demeanor/Rapport:  Other (Cooperative) Affect (typically observed):  Appropriate Orientation:  Oriented to Self, Oriented to Place, Oriented to  Time Alcohol / Substance use:  Not Applicable Psych involvement (Current and /or in the community):  Outpatient Provider  Discharge Needs  Concerns to be addressed:  Discharge Planning Concerns Readmission within the last 30 days:  Yes Current discharge risk:  None Barriers to Discharge:  Continued Medical Work up   Danny Greene, Danny Greene 06/10/2015, 10:15 AM [phone]

## 2015-06-11 DIAGNOSIS — G43A1 Cyclical vomiting, intractable: Secondary | ICD-10-CM

## 2015-06-11 LAB — BASIC METABOLIC PANEL
ANION GAP: 3 — AB (ref 5–15)
BUN: 8 mg/dL (ref 6–20)
CALCIUM: 7.6 mg/dL — AB (ref 8.9–10.3)
CO2: 24 mmol/L (ref 22–32)
CREATININE: 0.72 mg/dL (ref 0.61–1.24)
Chloride: 109 mmol/L (ref 101–111)
GFR calc Af Amer: >60 mL/min (ref >60)
GLUCOSE: 97 mg/dL (ref 65–99)
Potassium: 4 mmol/L (ref 3.5–5.1)
Sodium: 136 mmol/L (ref 135–145)

## 2015-06-11 LAB — CBC
HCT: 31.5 % — ABNORMAL LOW (ref 39.0–52.0)
Hemoglobin: 10.9 g/dL — ABNORMAL LOW (ref 13.0–17.0)
MCH: 33.2 pg (ref 26.0–34.0)
MCHC: 34.6 g/dL (ref 30.0–36.0)
MCV: 96 fL (ref 78.0–100.0)
PLATELETS: 218 10*3/uL (ref 150–400)
RBC: 3.28 MIL/uL — ABNORMAL LOW (ref 4.22–5.81)
RDW: 12.4 % (ref 11.5–15.5)
WBC: 6.2 10*3/uL (ref 4.0–10.5)

## 2015-06-11 MED ORDER — PNEUMOCOCCAL VAC POLYVALENT 25 MCG/0.5ML IJ INJ: 0.5000 mL | INJECTION | INTRAMUSCULAR | Status: DC

## 2015-06-11 MED ORDER — INFLUENZA VAC SPLIT QUAD 0.5 ML IM SUSY: 0.5000 mL | PREFILLED_SYRINGE | INTRAMUSCULAR | Status: DC

## 2015-06-11 MED ORDER — PNEUMOCOCCAL VAC POLYVALENT 25 MCG/0.5ML IJ INJ
0.5000 mL | INJECTION | Freq: Once | INTRAMUSCULAR | Status: AC
  Administered 2015-06-11: 0.5 mL via INTRAMUSCULAR
  Filled 2015-06-11: qty 0.5

## 2015-06-11 MED ORDER — INFLUENZA VAC SPLIT QUAD 0.5 ML IM SUSY
0.5000 mL | PREFILLED_SYRINGE | Freq: Once | INTRAMUSCULAR | Status: AC
  Administered 2015-06-11: 0.5 mL via INTRAMUSCULAR
  Filled 2015-06-11: qty 0.5

## 2015-06-11 NOTE — Progress Notes (Signed)
Pt discharged back to [facility] per Dr. Jerilee Hoh.  Pt's IV site D/C'd and WDL.  Pt's VSS.  Transporter from [facility] here to pick patient up.  Provided with FL2 packet, D/C summary and AVS.  Verbalized understanding.  Pt left floor via WC in stable condition accompanied by NT.

## 2015-06-11 NOTE — Discharge Summary (Signed)
Physician Discharge Summary  Danny Greene QQV:[id number] DOB: 03-01-1961 DOA: 06/09/2015  PCP: Lauretta Grill, NP  Admit date: 06/09/2015 Discharge date: 06/11/2015  Time spent: 45 minutes  Recommendations for Outpatient Follow-up:  -Will be discharged back to ALF today. -Needs to sit up straight for 30 minutes after taking any pills and these need to be chased down with a good 8 oz of liquids to prevent pill-induced esophagitis.   Discharge Diagnoses:  Principal Problem:   Ulcerative esophagitis Active Problems:   Hypokalemia   Schizophrenia, unspecified type (HCC)   Intractable vomiting with nausea   Dehydration   Discharge Condition: Stable and improved  Filed Weights   06/09/15 1403 06/09/15 1754  Weight: 64.411 kg (142 lb) 62.324 kg (137 lb 6.4 oz)    History of present illness:  As per Dr. Anastasio Champion 10/6: Danny Greene is a 54 y.o. male  This is a 54 year old man who has a history of schizophrenia and lives in a group home. He presents with a two-week history of poor by mouth intake and nausea and vomiting. He also describes lower abdominal pain. He says he has had diarrhea also. However, there is no rectal bleeding, black stools, hematemesis. He denies any chest pain, palpitations or dyspnea. He denies any fever. Evaluation in the emergency room showed him to be hypokalemic and in view of the intractable nausea and vomiting, he is being admitted for further investigation and management.  Hospital Course:   Hypokalemia -2/2 GI losses. -Repleted. -Mg ok at 2.3.  N/v -Improved. -2/2 ulcerative esophagitis. -Tolerating solid meals.  Ulcerative Esophagitis -Continue BID PPI, sucralfate. -Seen on EGD during prior admission. -May have a component of pill-induced esophagitis, and reflux-precautions have been discussed with him and are outlaid above.  Schizophrenia -Mood stable. -Continue psychotropic medications.  Procedures:  None    Consultations:  GI  Discharge Instructions  Discharge Instructions    Increase activity slowly    Complete by:  As directed             Medication List    STOP taking these medications        Potassium Chloride 40 MEQ/15ML (20%) Soln      TAKE these medications        clozapine 200 MG tablet  Commonly known as:  CLOZARIL  Take 200 mg by mouth daily.     fluvoxaMINE 100 MG tablet  Commonly known as:  LUVOX  Take 100 mg by mouth at bedtime. sleep     ondansetron 4 MG disintegrating tablet  Commonly known as:  ZOFRAN-ODT  Place 8 mg under the tongue 2 (two) times daily.     pantoprazole 40 MG tablet  Commonly known as:  PROTONIX  Take 1 tablet (40 mg total) by mouth 2 (two) times daily.     senna 8.6 MG Tabs tablet  Commonly known as:  SENOKOT  Take 2 tablets by mouth at bedtime.     sucralfate 1 GM/10ML suspension  Commonly known as:  CARAFATE  Take 10 mLs (1 g total) by mouth 4 (four) times daily -  with meals and at bedtime.       No Known Allergies    The results of significant diagnostics from this hospitalization (including imaging, microbiology, ancillary and laboratory) are listed below for reference.    Significant Diagnostic Studies: Dg Chest 2 View  05/13/2015   CLINICAL DATA:  Weakness, dizziness and nausea today.  Weight loss.  EXAM: CHEST  2  VIEW  COMPARISON:  Single view of the chest 12/02/2014.  FINDINGS: The lungs are clear. Heart size is normal. No pneumothorax or pleural effusion. No focal bony abnormality  IMPRESSION: Negative chest   Electronically Signed   By: Inge Rise M.D.   On: 05/13/2015 15:36   Ct Chest W Contrast  05/25/2015   CLINICAL DATA:  Hiatal hernia, abnormal appearance of distal esophagus showing hiatal hernia with surrounding fluid mediastinum question esophagitis versus mediastinitis  EXAM: CT CHEST WITH CONTRAST  TECHNIQUE: Multidetector CT imaging of the chest was performed during intravenous contrast  administration. Sagittal and coronal MPR images reconstructed from axial data set.  CONTRAST:  56mL OMNIPAQUE IOHEXOL 300 MG/ML  SOLN IV  COMPARISON:  CT abdomen pelvis 05/25/2015  FINDINGS: Thoracic vascular structures patent and unremarkable on nondedicated exam.  No thoracic adenopathy.  Diffuse wall thickening of the esophagus consistent with esophagitis.  Air-fluid level seen within mid thoracic esophagus with fluid filled distal esophagus.  Esophageal wall thickening becomes greater more distally in extends into a thickened wall of the hiatal hernia.  Infiltrate changes are seen in the retrocrural space and extending around the hiatal hernia.  Small lymph nodes within the retrocrural space.  No pneumomediastinum or pneumothorax.  Several small curvilinear vessels are seen within the paraesophageal fat question tiny varices.  Linear subsegmental atelectasis at both lung bases.  Lungs otherwise clear.  No pleural effusion or pneumothorax.  Visualized portion of upper abdomen otherwise grossly unremarkable.  IMPRESSION: Small hiatal hernia.  Diffusely thickened wall of the hiatal hernia as well as the mid to distal esophagus with mild infiltrative changes of the fat at retrocrural space and inferior mediastinum adjacent to esophagus.  No evidence of esophageal perforation.  Findings are compatible with esophagitis.  The presence of edema surrounding the esophagus and hiatal hernia is abnormal for typical uncomplicated esophagitis; potentially this could be related to edema secondary to multiple episodes of vomiting if the hiatal hernia repeatedly traversed the edge of the hiatus, severe atypical esophagitis, or even Crohn's disease.   Electronically Signed   By: Lavonia Dana M.D.   On: 05/25/2015 23:37   Ct Abdomen Pelvis W Contrast  05/25/2015   CLINICAL DATA:  Nausea and vomiting and epigastric pain tonight. Hepatitis-C.  EXAM: CT ABDOMEN AND PELVIS WITH CONTRAST  TECHNIQUE: Multidetector CT imaging of the  abdomen and pelvis was performed using the standard protocol following bolus administration of intravenous contrast.  CONTRAST:  63mL OMNIPAQUE IOHEXOL 300 MG/ML SOLN, 144mL OMNIPAQUE IOHEXOL 300 MG/ML SOLN  COMPARISON:  None.  FINDINGS: Lower chest: There is a moderate hiatal hernia with edema in the mucosa of the hernia as well some free fluid in the middle mediastinum around the edematous hernia. This could represent severe acute esophagitis/mediastinitis. Slight focal atelectasis or scarring at the lung bases posteriorly. Heart size is normal.  Hepatobiliary: Normal.  Pancreas: Normal.  Spleen: Normal.  Adrenals/Urinary Tract: Normal.  Stomach/Bowel: Normal including the terminal ileum and appendix.  Vascular/Lymphatic: Normal.  No adenopathy.  Reproductive: Normal.  Other: No free air or free fluid.  Musculoskeletal: Old compression fracture of L1.  Otherwise normal.  IMPRESSION: Moderate hiatal hernia with free fluid around the hernia in the middle mediastinum. This could represent esophagitis/ mediastinitis and/or inflammation of the herniated gastric fundus. CT scan of the chest recommended for further evaluation of the mediastinum.   Electronically Signed   By: Lorriane Shire M.D.   On: 05/25/2015 21:39   Dg Abd Acute W/chest  06/09/2015   CLINICAL DATA:  Nausea, vomiting and diarrhea for 4 days, generalized abdominal pain for 2 weeks, mild shortness of breath, history hypertension, kidney stones, cholecystectomy, hepatitis-C, smoker  EXAM: DG ABDOMEN ACUTE W/ 1V CHEST  COMPARISON:  05/13/2015 chest radiographs, CT abdomen and pelvis 05/25/2015  FINDINGS: Normal heart size, mediastinal contours, and pulmonary vascularity.  Lungs hyperinflated but clear.  No pulmonary infiltrate, pleural effusion or pneumothorax.  Bones appear diffusely demineralized.  Nonobstructive bowel gas pattern.  Scattered gas throughout large and small bowel loops which are nondistended.  Gas and stool in rectosigmoid colon.  No  bowel dilatation, bowel wall thickening or free intraperitoneal air.  Compression deformity superior endplate L1, chronic.  No definite urinary tract calcification.  IMPRESSION: Normal bowel gas pattern.  Hyperinflated lungs question COPD.  Chronic superior endplate compression fracture of L1.   Electronically Signed   By: Lavonia Dana M.D.   On: 06/09/2015 15:02    Microbiology: No results found for this or any previous visit (from the past 240 hour(s)).   Labs: Basic Metabolic Panel:  Recent Labs Lab 06/09/15 1417 06/10/15 0544 06/11/15 0555  NA 132* 134* 136  K 2.4* 3.2* 4.0  CL 86* 98* 109  CO2 30 30 24   GLUCOSE 104* 96 97  BUN 19 14 8   CREATININE 1.16 0.83 0.72  CALCIUM 9.0 8.0* 7.6*  MG 2.3  --   --    Liver Function Tests:  Recent Labs Lab 06/09/15 1417 06/10/15 0544  AST 27 15  ALT 14* 10*  ALKPHOS 104 74  BILITOT 1.1 0.6  PROT 7.6 5.6*  ALBUMIN 3.9 3.0*    Recent Labs Lab 06/09/15 1417  LIPASE 24   No results for input(s): AMMONIA in the last 168 hours. CBC:  Recent Labs Lab 06/09/15 1417 06/10/15 0544 06/11/15 0555  WBC 11.2* 8.3 6.2  NEUTROABS 8.3*  --   --   HGB 14.3 11.7* 10.9*  HCT 41.3 34.8* 31.5*  MCV 94.3 95.3 96.0  PLT 274 237 218   Cardiac Enzymes:  Recent Labs Lab 06/09/15 1417  TROPONINI <0.03   BNP: BNP (last 3 results) No results for input(s): BNP in the last 8760 hours.  ProBNP (last 3 results) No results for input(s): PROBNP in the last 8760 hours.  CBG: No results for input(s): GLUCAP in the last 168 hours.     SignedLelon Frohlich  [facility] Pager: [phone] 06/11/2015, 10:37 AM

## 2015-08-31 ENCOUNTER — Ambulatory Visit (INDEPENDENT_AMBULATORY_CARE_PROVIDER_SITE_OTHER): Payer: Medicaid Other | Admitting: Gastroenterology

## 2015-08-31 ENCOUNTER — Encounter: Payer: Self-pay | Admitting: Gastroenterology

## 2015-08-31 ENCOUNTER — Encounter: Payer: Self-pay | Admitting: Internal Medicine

## 2015-08-31 ENCOUNTER — Other Ambulatory Visit: Payer: Self-pay

## 2015-08-31 VITALS — BP 108/71 | HR 103 | Temp 98.4°F | Ht 71.0 in | Wt 161.2 lb

## 2015-08-31 DIAGNOSIS — K2210 Ulcer of esophagus without bleeding: Secondary | ICD-10-CM

## 2015-08-31 DIAGNOSIS — K219 Gastro-esophageal reflux disease without esophagitis: Secondary | ICD-10-CM

## 2015-08-31 NOTE — Progress Notes (Signed)
Please schedule OV in 2 months to follow up on HCV, consider colonoscopy.

## 2015-08-31 NOTE — Progress Notes (Signed)
APPOINTMENT MADE AND LETTER SENT °

## 2015-08-31 NOTE — Progress Notes (Signed)
CC'ED TO PCP 

## 2015-08-31 NOTE — Patient Instructions (Addendum)
1. Upper endoscopy as scheduled. See separate instructions.  

## 2015-08-31 NOTE — Progress Notes (Signed)
Primary Care Physician:  Lauretta Grill, NP  Primary Gastroenterologist:  Garfield Cornea, MD   Chief Complaint  Patient presents with  . 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 Initially seen during hospitalization back in September. He has a history of schizophrenia, hepatitis C and had presented with 2 week history of abdominal pain associated with nausea and vomiting. He resides in a group home/assisted living facility and [facility]. Reported 20 pound weight loss at that time. He had a CT of the abdomen and pelvis with contrast noted to have a moderate hiatal hernia with free fluid around the hernia in the middle mediastinum thought to represent either acute esophagitis/mediastinitis. No evidence of esophageal perforation. EGD at that time showed severe ulcerative reflux esophagitis with benign biopsies. Plans for repeat EGD in 3 months. Patient readmitted in October with intractable vomiting, poor oral intake. It was felt that he had inflammation compounded by chemical/medication injury secondary to oral potassium and other medications continuing to his severe ulcerative esophagitis esophagitis. Supportive treatment provided.  Presents back today to schedule EGD. Weight is up from 137 pounds in October to 161 pounds today. States he feels good. Appetite normal. Denies any heartburn, nausea or vomiting, abdominal pain. Bowel movements regular. No blood in the stool or melena. No dysphagia.  Current Outpatient Prescriptions  Medication Sig Dispense Refill  . clozapine (CLOZARIL) 200 MG tablet Take 200 mg by mouth daily.    . fluvoxaMINE (LUVOX) 100 MG tablet Take 100 mg by mouth at bedtime. sleep    . pantoprazole (PROTONIX) 40 MG tablet Take 1 tablet (40 mg total) by mouth 2 (two) times daily. 60 tablet 2  . senna (SENOKOT) 8.6 MG TABS tablet Take 2 tablets by mouth at bedtime. Reported on 08/31/2015     No current facility-administered  medications for this visit.    Allergies as of 08/31/2015  . (No Known Allergies)    Past Medical History  Diagnosis Date  . Schizophrenia ([place])   . Hepatitis C   . Hypertension   . GERD (gastroesophageal reflux disease)   . Constipation     Past Surgical History  Procedure Laterality Date  . Cholecystectomy    . Kidney stone surgery    . Esophagogastroduodenoscopy N/A 05/26/2015    RMR: Severe ulcerative esophagitits ad described. Status post biopsy. Hiatal hernia.   . Esophageal dilation N/A 05/26/2015    Procedure: ESOPHAGEAL DILATION;  Surgeon: Daneil Dolin, MD;  Location: [facility];  Service: Endoscopy;  Laterality: N/A;    Family History  Problem Relation Age of Onset  . Liver disease Neg Hx   . Colon cancer Neg Hx     Social History   Social History  . Marital Status: Single    Spouse Name: N/A  . Number of Children: N/A  . Years of Education: N/A   Occupational History  . Not on file.   Social History Main Topics  . Smoking status: Current Every Day Smoker -- 1.00 packs/day    Types: Cigarettes  . Smokeless tobacco: Not on file  . Alcohol Use: No  . Drug Use: No  . Sexual Activity: Not Currently   Other Topics Concern  . Not on file   Social History Narrative      ROS:  General: Negative for anorexia, weight loss, fever, chills, fatigue, weakness. Eyes: Negative for vision changes.  ENT: Negative for hoarseness, difficulty swallowing , nasal  congestion. CV: Negative for chest pain, angina, palpitations, dyspnea on exertion, peripheral edema.  Respiratory: Negative for dyspnea at rest, dyspnea on exertion, cough, sputum, wheezing.  GI: See history of present illness. GU:  Negative for dysuria, hematuria, urinary incontinence, urinary frequency, nocturnal urination.  MS: Negative for joint pain, low back pain.  Derm: Negative for rash or itching.  Neuro: Negative for weakness, abnormal sensation, seizure, frequent headaches, memory loss,  confusion.  Psych: Negative for anxiety, depression, suicidal ideation, hallucinations.  Endo: Negative for unusual weight change.  Heme: Negative for bruising or bleeding. Allergy: Negative for rash or hives.    Physical Examination:  BP 108/71 mmHg  Pulse 103  Temp(Src) 98.4 F (36.9 C) (Oral)  Ht 5\' 11"  (1.803 m)  Wt 161 lb 3.2 oz (73.12 kg)  BMI 22.49 kg/m2   General: Well-nourished, well-developed in no acute distress.  Head: Normocephalic, atraumatic.   Eyes: Conjunctiva pink, no icterus. Mouth: Oropharyngeal mucosa moist and pink , no lesions erythema or exudate. Neck: Supple without thyromegaly, masses, or lymphadenopathy.  Lungs: Clear to auscultation bilaterally.  Heart: Regular rate and rhythm, no murmurs rubs or gallops.  Abdomen: Bowel sounds are normal, nontender, nondistended, no hepatosplenomegaly or masses, no abdominal bruits or    hernia , no rebound or guarding.   Rectal: Not performed Extremities: No lower extremity edema. No clubbing or deformities.  Neuro: Alert and oriented x 4 , grossly normal neurologically.  Skin: Warm and dry, no rash or jaundice.   Psych: Alert and cooperative, normal mood and affect.  Labs: Lab Results  Component Value Date   WBC 6.2 06/11/2015   HGB 10.9* 06/11/2015   HCT 31.5* 06/11/2015   MCV 96.0 06/11/2015   PLT 218 06/11/2015   Lab Results  Component Value Date   CREATININE 0.72 06/11/2015   BUN 8 06/11/2015   NA 136 06/11/2015   K 4.0 06/11/2015   CL 109 06/11/2015   CO2 24 06/11/2015   Lab Results  Component Value Date   ALT 10* 06/10/2015   AST 15 06/10/2015   ALKPHOS 74 06/10/2015   BILITOT 0.6 06/10/2015     Imaging Studies: No results found.  Impression/plan:  54 year old gentleman with severe ulcerative reflux esophagitis as outlined above. Clinically doing much better. Weight is up. He is due for three-month repeat EGD to evaluate for healing and to screen for Barrett's esophagus.  I have  discussed the risks, alternatives, benefits with regards to but not limited to the risk of reaction to medication, bleeding, infection, perforation and the patient is agreeable to proceed. Written consent to be obtained.  Continue PPI therapy.  Patient also has a history of hepatitis C, details unavailable. Patient denies history of hep C but staff member present today states he does.  No prior colonoscopy. Consider at a later date.  Return to the office in 2 months or sooner if needed.

## 2015-09-21 ENCOUNTER — Encounter (HOSPITAL_COMMUNITY): Payer: Self-pay | Admitting: *Deleted

## 2015-09-21 ENCOUNTER — Encounter (HOSPITAL_COMMUNITY)
Admission: RE | Disposition: A | Discharge status: Home Health | Payer: Self-pay | Source: Ambulatory Visit | Attending: Internal Medicine

## 2015-09-21 ENCOUNTER — Ambulatory Visit (HOSPITAL_COMMUNITY)
Admission: RE | Admit: 2015-09-21 | Discharge: 2015-09-21 | Disposition: A | Discharge status: Home Health | Payer: Medicaid Other | Source: Ambulatory Visit | Attending: Internal Medicine | Admitting: Internal Medicine

## 2015-09-21 DIAGNOSIS — R1310 Dysphagia, unspecified: Secondary | ICD-10-CM | POA: Diagnosis not present

## 2015-09-21 DIAGNOSIS — F209 Schizophrenia, unspecified: Secondary | ICD-10-CM | POA: Diagnosis not present

## 2015-09-21 DIAGNOSIS — Z79899 Other long term (current) drug therapy: Secondary | ICD-10-CM | POA: Insufficient documentation

## 2015-09-21 DIAGNOSIS — F17210 Nicotine dependence, cigarettes, uncomplicated: Secondary | ICD-10-CM | POA: Diagnosis not present

## 2015-09-21 DIAGNOSIS — K449 Diaphragmatic hernia without obstruction or gangrene: Secondary | ICD-10-CM | POA: Insufficient documentation

## 2015-09-21 DIAGNOSIS — K210 Gastro-esophageal reflux disease with esophagitis: Secondary | ICD-10-CM | POA: Diagnosis not present

## 2015-09-21 DIAGNOSIS — I10 Essential (primary) hypertension: Secondary | ICD-10-CM | POA: Insufficient documentation

## 2015-09-21 DIAGNOSIS — K219 Gastro-esophageal reflux disease without esophagitis: Secondary | ICD-10-CM | POA: Insufficient documentation

## 2015-09-21 DIAGNOSIS — B1920 Unspecified viral hepatitis C without hepatic coma: Secondary | ICD-10-CM | POA: Insufficient documentation

## 2015-09-21 DIAGNOSIS — K229 Disease of esophagus, unspecified: Secondary | ICD-10-CM | POA: Diagnosis not present

## 2015-09-21 HISTORY — PX: ESOPHAGEAL DILATION: SHX303

## 2015-09-21 HISTORY — PX: ESOPHAGOGASTRODUODENOSCOPY: SHX5428

## 2015-09-21 SURGERY — EGD (ESOPHAGOGASTRODUODENOSCOPY)
Anesthesia: Moderate Sedation

## 2015-09-21 MED ORDER — ONDANSETRON HCL 4 MG/2ML IJ SOLN
INTRAMUSCULAR | Status: DC | PRN
  Administered 2015-09-21: 4 mg via INTRAVENOUS

## 2015-09-21 MED ORDER — STERILE WATER FOR IRRIGATION IR SOLN
Status: DC | PRN
  Administered 2015-09-21: 10:00:00

## 2015-09-21 MED ORDER — MEPERIDINE HCL 100 MG/ML IJ SOLN
INTRAMUSCULAR | Status: AC
  Filled 2015-09-21: qty 2

## 2015-09-21 MED ORDER — SODIUM CHLORIDE 0.9 % IV SOLN
INTRAVENOUS | Status: DC
  Administered 2015-09-21: 1000 mL via INTRAVENOUS

## 2015-09-21 MED ORDER — MEPERIDINE HCL 100 MG/ML IJ SOLN
INTRAMUSCULAR | Status: DC | PRN
  Administered 2015-09-21: 50 mg via INTRAVENOUS
  Administered 2015-09-21: 25 mg via INTRAVENOUS

## 2015-09-21 MED ORDER — MIDAZOLAM HCL 5 MG/5ML IJ SOLN
INTRAMUSCULAR | Status: DC | PRN
  Administered 2015-09-21 (×2): 2 mg via INTRAVENOUS

## 2015-09-21 MED ORDER — MIDAZOLAM HCL 5 MG/5ML IJ SOLN
INTRAMUSCULAR | Status: AC
  Filled 2015-09-21: qty 10

## 2015-09-21 MED ORDER — LIDOCAINE VISCOUS 2 % MT SOLN
OROMUCOSAL | Status: DC | PRN
  Administered 2015-09-21: 1 via OROMUCOSAL

## 2015-09-21 MED ORDER — LIDOCAINE VISCOUS 2 % MT SOLN
OROMUCOSAL | Status: AC
  Filled 2015-09-21: qty 15

## 2015-09-21 MED ORDER — ONDANSETRON HCL 4 MG/2ML IJ SOLN
INTRAMUSCULAR | Status: AC
  Filled 2015-09-21: qty 2

## 2015-09-21 NOTE — Interval H&P Note (Signed)
History and Physical Interval Note:  09/21/2015 10:09 AM  Danny Greene  has presented today for surgery, with the diagnosis of ulcerative reflux esophagitits  The various methods of treatment have been discussed with the patient and family. After consideration of risks, benefits and other options for treatment, the patient has consented to  Procedure(s) with comments: ESOPHAGOGASTRODUODENOSCOPY (EGD) (N/A) - 1030 as a surgical intervention .  The patient's history has been reviewed, patient examined, no change in status, stable for surgery.  I have reviewed the patient's chart and labs.  Questions were answered to the patient's satisfaction.     Robert Rourk  No change. Patient does relate intermittent esophageal dysfunction read. Perform esophageal dilation as feasible/appropriate. Patient agreeable. The risks, benefits, limitations, alternatives and imponderables have been reviewed with the patient. Potential for esophageal dilation, biopsy, etc. have also been reviewed.  Questions have been answered. All parties agreeable.

## 2015-09-21 NOTE — Discharge Instructions (Signed)
EGD Discharge instructions Please read the instructions outlined below and refer to this sheet in the next few weeks. These discharge instructions provide you with general information on caring for yourself after you leave the hospital. Your doctor may also give you specific instructions. While your treatment has been planned according to the most current medical practices available, unavoidable complications occasionally occur. If you have any problems or questions after discharge, please call your doctor. ACTIVITY  You may resume your regular activity but move at a slower pace for the next 24 hours.   Take frequent rest periods for the next 24 hours.   Walking will help expel (get rid of) the air and reduce the bloated feeling in your abdomen.   No driving for 24 hours (because of the anesthesia (medicine) used during the test).   You may shower.   Do not sign any important legal documents or operate any machinery for 24 hours (because of the anesthesia used during the test).  NUTRITION  Drink plenty of fluids.   You may resume your normal diet.   Begin with a light meal and progress to your normal diet.   Avoid alcoholic beverages for 24 hours or as instructed by your caregiver.  MEDICATIONS  You may resume your normal medications unless your caregiver tells you otherwise.  WHAT YOU CAN EXPECT TODAY  You may experience abdominal discomfort such as a feeling of fullness or gas pains.  FOLLOW-UP  Your doctor will discuss the results of your test with you.  SEEK IMMEDIATE MEDICAL ATTENTION IF ANY OF THE FOLLOWING OCCUR:  Excessive nausea (feeling sick to your stomach) and/or vomiting.   Severe abdominal pain and distention (swelling).   Trouble swallowing.   Temperature over 101 F (37.8 C).   Rectal bleeding or vomiting of blood.    May decrease Protonix to 40 mg daily but need to take every day forever  Office visit with [place] in 2 months.  Further recommendations  to follow pending review of pathology report

## 2015-09-21 NOTE — H&P (View-Only) (Signed)
Primary Care Physician:  Lauretta Grill, NP  Primary Gastroenterologist:  Garfield Cornea, MD   Chief Complaint  Patient presents with  . Follow-up    HPI:  [name] is a 55 y.o. male here to schedule repeat EGD. Initially seen during hospitalization back in September. He has a history of schizophrenia, hepatitis C and had presented with 2 week history of abdominal pain associated with nausea and vomiting. He resides in a group home/assisted living facility and [facility]. Reported 20 pound weight loss at that time. He had a CT of the abdomen and pelvis with contrast noted to have a moderate hiatal hernia with free fluid around the hernia in the middle mediastinum thought to represent either acute esophagitis/mediastinitis. No evidence of esophageal perforation. EGD at that time showed severe ulcerative reflux esophagitis with benign biopsies. Plans for repeat EGD in 3 months. Patient readmitted in October with intractable vomiting, poor oral intake. It was felt that he had inflammation compounded by chemical/medication injury secondary to oral potassium and other medications continuing to his severe ulcerative esophagitis esophagitis. Supportive treatment provided.  Presents back today to schedule EGD. Weight is up from 137 pounds in October to 161 pounds today. States he feels good. Appetite normal. Denies any heartburn, nausea or vomiting, abdominal pain. Bowel movements regular. No blood in the stool or melena. No dysphagia.  Current Outpatient Prescriptions  Medication Sig Dispense Refill  . clozapine (CLOZARIL) 200 MG tablet Take 200 mg by mouth daily.    . fluvoxaMINE (LUVOX) 100 MG tablet Take 100 mg by mouth at bedtime. sleep    . pantoprazole (PROTONIX) 40 MG tablet Take 1 tablet (40 mg total) by mouth 2 (two) times daily. 60 tablet 2  . senna (SENOKOT) 8.6 MG TABS tablet Take 2 tablets by mouth at bedtime. Reported on 08/31/2015     No current facility-administered  medications for this visit.    Allergies as of 08/31/2015  . (No Known Allergies)    Past Medical History  Diagnosis Date  . Schizophrenia ([place])   . Hepatitis C   . Hypertension   . GERD (gastroesophageal reflux disease)   . Constipation     Past Surgical History  Procedure Laterality Date  . Cholecystectomy    . Kidney stone surgery    . Esophagogastroduodenoscopy N/A 05/26/2015    RMR: Severe ulcerative esophagitits ad described. Status post biopsy. Hiatal hernia.   . Esophageal dilation N/A 05/26/2015    Procedure: ESOPHAGEAL DILATION;  Surgeon: Daneil Dolin, MD;  Location: [facility];  Service: Endoscopy;  Laterality: N/A;    Family History  Problem Relation Age of Onset  . Liver disease Neg Hx   . Colon cancer Neg Hx     Social History   Social History  . Marital Status: Single    Spouse Name: N/A  . Number of Children: N/A  . Years of Education: N/A   Occupational History  . Not on file.   Social History Main Topics  . Smoking status: Current Every Day Smoker -- 1.00 packs/day    Types: Cigarettes  . Smokeless tobacco: Not on file  . Alcohol Use: No  . Drug Use: No  . Sexual Activity: Not Currently   Other Topics Concern  . Not on file   Social History Narrative      ROS:  General: Negative for anorexia, weight loss, fever, chills, fatigue, weakness. Eyes: Negative for vision changes.  ENT: Negative for hoarseness, difficulty swallowing , nasal  congestion. CV: Negative for chest pain, angina, palpitations, dyspnea on exertion, peripheral edema.  Respiratory: Negative for dyspnea at rest, dyspnea on exertion, cough, sputum, wheezing.  GI: See history of present illness. GU:  Negative for dysuria, hematuria, urinary incontinence, urinary frequency, nocturnal urination.  MS: Negative for joint pain, low back pain.  Derm: Negative for rash or itching.  Neuro: Negative for weakness, abnormal sensation, seizure, frequent headaches, memory loss,  confusion.  Psych: Negative for anxiety, depression, suicidal ideation, hallucinations.  Endo: Negative for unusual weight change.  Heme: Negative for bruising or bleeding. Allergy: Negative for rash or hives.    Physical Examination:  BP 108/71 mmHg  Pulse 103  Temp(Src) 98.4 F (36.9 C) (Oral)  Ht 5\' 11"  (1.803 m)  Wt 161 lb 3.2 oz (73.12 kg)  BMI 22.49 kg/m2   General: Well-nourished, well-developed in no acute distress.  Head: Normocephalic, atraumatic.   Eyes: Conjunctiva pink, no icterus. Mouth: Oropharyngeal mucosa moist and pink , no lesions erythema or exudate. Neck: Supple without thyromegaly, masses, or lymphadenopathy.  Lungs: Clear to auscultation bilaterally.  Heart: Regular rate and rhythm, no murmurs rubs or gallops.  Abdomen: Bowel sounds are normal, nontender, nondistended, no hepatosplenomegaly or masses, no abdominal bruits or    hernia , no rebound or guarding.   Rectal: Not performed Extremities: No lower extremity edema. No clubbing or deformities.  Neuro: Alert and oriented x 4 , grossly normal neurologically.  Skin: Warm and dry, no rash or jaundice.   Psych: Alert and cooperative, normal mood and affect.  Labs: Lab Results  Component Value Date   WBC 6.2 06/11/2015   HGB 10.9* 06/11/2015   HCT 31.5* 06/11/2015   MCV 96.0 06/11/2015   PLT 218 06/11/2015   Lab Results  Component Value Date   CREATININE 0.72 06/11/2015   BUN 8 06/11/2015   NA 136 06/11/2015   K 4.0 06/11/2015   CL 109 06/11/2015   CO2 24 06/11/2015   Lab Results  Component Value Date   ALT 10* 06/10/2015   AST 15 06/10/2015   ALKPHOS 74 06/10/2015   BILITOT 0.6 06/10/2015     Imaging Studies: No results found.  Impression/plan:  55 year old gentleman with severe ulcerative reflux esophagitis as outlined above. Clinically doing much better. Weight is up. He is due for three-month repeat EGD to evaluate for healing and to screen for Barrett's esophagus.  I have  discussed the risks, alternatives, benefits with regards to but not limited to the risk of reaction to medication, bleeding, infection, perforation and the patient is agreeable to proceed. Written consent to be obtained.  Continue PPI therapy.  Patient also has a history of hepatitis C, details unavailable. Patient denies history of hep C but staff member present today states he does.  No prior colonoscopy. Consider at a later date.  Return to the office in 2 months or sooner if needed.

## 2015-09-21 NOTE — Op Note (Signed)
[facility] [address] [place], [address]   ENDOSCOPY PROCEDURE REPORT  PATIENT: [name], [name]  MR#: [id number] BIRTHDATE: 1961-02-07 , 26  yrs. old GENDER: male ENDOSCOPIST: R.  Garfield Cornea, MD FACP [facility] REFERRED BY:  Lauretta Grill, FNP PROCEDURE DATE:  October 10, 2015 PROCEDURE:  EGD w/ biopsy and Maloney dilation of esophagus INDICATIONS:  History of severe reflux esophagitis; dysphagia. MEDICATIONS: Versed 4 mg IV and Demerol 75 mg IV in divided doses. Xylocaine gel orally.  Zofran 4 mg IV ASA CLASS:      Class II  CONSENT: The risks, benefits, limitations, alternatives and imponderables have been discussed.  The potential for biopsy, esophogeal dilation, etc. have also been reviewed.  Questions have been answered.  All parties agreeable.  Please see the history and physical in the medical record for more information.  DESCRIPTION OF PROCEDURE: After the risks benefits and alternatives of the procedure were thoroughly explained, informed consent was obtained.  The [id number] endoscope was introduced through the mouth and advanced to the second portion of the duodenum , limited by Without limitations. The instrument was slowly withdrawn as the mucosa was fully examined. Estimated blood loss is zero unless otherwise noted in this procedure report.    Examination the tubular esophagus revealed no esophagitis.  However, patient had 4 "tongues" of salmon-colored epithelium coming up approximately 2 cm from the GE junction.  The tubular esophagus appeared patent throughout its course.  No tumor seen.  Stomach empty.  2 cm hiatal hernia.  Normal appearing gastric mucosa. Patent pylorus.  Normal first and second portion of the duodenum.  A 54 French Maloney dilator was passed to full insertion easily.  A look back revealed no apparent complication related to this maneuver.  Finally, biopsies of abnormal distal esophagus taken. The scope was then  withdrawn from the patient and the procedure completed.  COMPLICATIONS: There were no immediate complications.  ENDOSCOPIC IMPRESSION: Abnormal distal esophagus query short segment Barrett's?"status post biopsy. Severe esophagitis seen previously has resolved. Hiatal hernia. Status post Venia Minks dilation prior to biopsy.  RECOMMENDATIONS: Decrease Protonix to 40 mg once daily but recommend this medication be taken forever. Follow-up on pathology. Office visit with [place] in 2 months.  REPEAT EXAM:  eSigned:  R. Garfield Cornea, MD Rosalita Chessman [facility] 2015/10/10 10:38 AM    CC:  CPT CODES: ICD CODES:  The ICD and CPT codes recommended by this software are interpretations from the data that the clinical staff has captured with the software.  The verification of the translation of this report to the ICD and CPT codes and modifiers is the sole responsibility of the health care institution and practicing physician where this report was generated.  [employer] will not be held responsible for the validity of the ICD and CPT codes included on this report.  AMA assumes no liability for data contained or not contained herein. CPT is a Designer, television/film set of the [employer].  PATIENT NAME:  Danny Greene, Danny Greene MR#: [id number]

## 2015-09-25 ENCOUNTER — Encounter: Payer: Self-pay | Admitting: Internal Medicine

## 2015-09-26 ENCOUNTER — Encounter (HOSPITAL_COMMUNITY): Payer: Self-pay | Admitting: Internal Medicine

## 2015-11-01 ENCOUNTER — Ambulatory Visit (INDEPENDENT_AMBULATORY_CARE_PROVIDER_SITE_OTHER): Payer: Medicaid Other | Admitting: Gastroenterology

## 2015-11-01 ENCOUNTER — Encounter: Payer: Self-pay | Admitting: Gastroenterology

## 2015-11-01 VITALS — BP 104/73 | HR 99 | Temp 96.5°F | Ht 71.0 in | Wt 168.4 lb

## 2015-11-01 DIAGNOSIS — R768 Other specified abnormal immunological findings in serum: Secondary | ICD-10-CM | POA: Insufficient documentation

## 2015-11-01 DIAGNOSIS — B182 Chronic viral hepatitis C: Secondary | ICD-10-CM

## 2015-11-01 DIAGNOSIS — D649 Anemia, unspecified: Secondary | ICD-10-CM

## 2015-11-01 DIAGNOSIS — K2100 Gastro-esophageal reflux disease with esophagitis, without bleeding: Secondary | ICD-10-CM

## 2015-11-01 NOTE — Patient Instructions (Signed)
1. Please have our labs done with your next blood draw. We will let you know results when they are available. SEND COPY TO [place] PLEASE.

## 2015-11-01 NOTE — Progress Notes (Signed)
Primary Care Physician: Lauretta Grill, NP  Primary Gastroenterologist:  Garfield Cornea, MD   Chief Complaint  Patient presents with  . Follow-up    HPI: Danny Greene is a 55 y.o. male here for follow-up. EGD 09/21/2015 for severe reflux esophagitis, dysphagia. Abnormal distal esophagus very short segment Barrett's, severe esophagitis seen previously have resolved. Hiatal hernia. Status post Venia Minks dilation hard to biopsy. Biopsy showed inflamed squamocolumnar mucosa with reactive changes consistent with acute and chronic inflammation, suggestive of reflux-related injury. Weight is up 30 pounds since October. He is back at baseline. We discussed possibility of screening colonoscopy today. Patient is not interested in pursuing colonoscopy. He tells me he feels well. His appetite is good. No heartburn. No dysphagia. No abdominal pain. Bowel movements are regular. No blood in the stool or melena. There has been some discussion as to whether or not patient has a history of chronic hepatitis C. The facility says that he does but the patient denies. He has agreed to lab testing.  Current Outpatient Prescriptions  Medication Sig Dispense Refill  . clozapine (CLOZARIL) 200 MG tablet Take 200 mg by mouth daily.    . fluvoxaMINE (LUVOX) 100 MG tablet Take 100 mg by mouth at bedtime. sleep    . pantoprazole (PROTONIX) 40 MG tablet Take 1 tablet (40 mg total) by mouth 2 (two) times daily. 60 tablet 2  . senna (SENOKOT) 8.6 MG TABS tablet Take 2 tablets by mouth at bedtime.      No current facility-administered medications for this visit.    Allergies as of 11/01/2015  . (No Known Allergies)    ROS:  General: Negative for anorexia, weight loss, fever, chills, fatigue, weakness. ENT: Negative for hoarseness, difficulty swallowing , nasal congestion. CV: Negative for chest pain, angina, palpitations, dyspnea on exertion, peripheral edema.  Respiratory: Negative for dyspnea at rest,  dyspnea on exertion, cough, sputum, wheezing.  GI: See history of present illness. GU:  Negative for dysuria, hematuria, urinary incontinence, urinary frequency, nocturnal urination.  Endo: Negative for unusual weight change.    Physical Examination:   BP 104/73 mmHg  Pulse 99  Temp(Src) 96.5 F (35.8 C)  Ht 5\' 11"  (1.803 m)  Wt 168 lb 6.4 oz (76.386 kg)  BMI 23.50 kg/m2  General: Well-nourished, well-developed in no acute distress.  Eyes: No icterus. Mouth: Oropharyngeal mucosa moist and pink , no lesions erythema or exudate. Lungs: Clear to auscultation bilaterally.  Heart: Regular rate and rhythm, no murmurs rubs or gallops.  Abdomen: Bowel sounds are normal, nontender, nondistended, no hepatosplenomegaly or masses, no abdominal bruits or hernia , no rebound or guarding.   Extremities: No lower extremity edema. No clubbing or deformities. Neuro: Alert and oriented x 4   Skin: Warm and dry, no jaundice.   Psych: Alert and cooperative, normal mood and affect.  Labs:  Lab Results  Component Value Date   ALT 10* 06/10/2015   AST 15 06/10/2015   ALKPHOS 74 06/10/2015   BILITOT 0.6 06/10/2015   Lab Results  Component Value Date   CREATININE 0.72 06/11/2015   BUN 8 06/11/2015   NA 136 06/11/2015   K 4.0 06/11/2015   CL 109 06/11/2015   CO2 24 06/11/2015   Lab Results  Component Value Date   WBC 6.2 06/11/2015   HGB 10.9* 06/11/2015   HCT 31.5* 06/11/2015   MCV 96.0 06/11/2015   PLT 218 06/11/2015     Imaging Studies: No results found.

## 2015-11-01 NOTE — Progress Notes (Signed)
We also need to get a CBC for anemia when he has his labs. Please let the facility know.

## 2015-11-01 NOTE — Addendum Note (Signed)
Addended by: Mahala Menghini on: 11/01/2015 10:04 AM   Modules accepted: Orders

## 2015-11-01 NOTE — Assessment & Plan Note (Addendum)
Clinically doing very well. Continue pantoprazole 40 mg twice a day for now. Previously with anemia in the setting of severe esophagitis. Need to update CBC. Patient declines screening colonoscopy.

## 2015-11-01 NOTE — Progress Notes (Signed)
CC'C TO PCP °

## 2015-11-01 NOTE — Assessment & Plan Note (Signed)
Labs to determine if he has chronic hepatitis C. Patient agreeable.

## 2015-12-26 ENCOUNTER — Telehealth: Payer: Self-pay | Admitting: Gastroenterology

## 2015-12-26 NOTE — Telephone Encounter (Signed)
Danny Greene, pt lives at [facility] facility in [place]. Can you see if they did the labs and will send [place] a copy?

## 2015-12-26 NOTE — Telephone Encounter (Signed)
Please send reminder letter about labs that were due 10/2015.

## 2015-12-27 NOTE — Telephone Encounter (Signed)
requested

## 2016-01-19 ENCOUNTER — Telehealth: Payer: Self-pay | Admitting: Gastroenterology

## 2016-01-19 NOTE — Telephone Encounter (Signed)
Took a while to obtain labs that were done in patients residual facility.  For March 2017 Hepatitis B surface antibody negative, hepatitis B surface antigen nonreactive, hepatitis A antibody IgM nonreactive, hepatitis C antibody reactive, HCV RNA qualitative negative  Labs were done at [facility]. Almyra Free, can we see if the CBC, total Hep A Ab were done as requested

## 2016-01-24 NOTE — Telephone Encounter (Signed)
noted 

## 2016-01-24 NOTE — Telephone Encounter (Signed)
Labs results are in your cart.

## 2016-08-19 ENCOUNTER — Encounter: Payer: Self-pay | Admitting: Gastroenterology

## 2016-08-19 ENCOUNTER — Telehealth: Payer: Self-pay | Admitting: Gastroenterology

## 2016-08-19 NOTE — Telephone Encounter (Signed)
Patient needs one year follow up in 10/2016.

## 2016-08-20 ENCOUNTER — Encounter: Payer: Self-pay | Admitting: Internal Medicine

## 2016-08-20 NOTE — Telephone Encounter (Signed)
APPT MADE AND LETTER SENT  °

## 2016-09-27 ENCOUNTER — Encounter: Payer: Self-pay | Admitting: Internal Medicine

## 2016-10-16 ENCOUNTER — Ambulatory Visit: Payer: Medicaid Other | Admitting: Gastroenterology

## 2016-10-30 ENCOUNTER — Ambulatory Visit (INDEPENDENT_AMBULATORY_CARE_PROVIDER_SITE_OTHER): Payer: Medicaid Other | Admitting: Gastroenterology

## 2016-10-30 ENCOUNTER — Encounter: Payer: Self-pay | Admitting: Gastroenterology

## 2016-10-30 VITALS — BP 116/83 | HR 97 | Temp 97.4°F | Ht 71.0 in | Wt 191.8 lb

## 2016-10-30 DIAGNOSIS — Z1211 Encounter for screening for malignant neoplasm of colon: Secondary | ICD-10-CM | POA: Diagnosis not present

## 2016-10-30 DIAGNOSIS — K2100 Gastro-esophageal reflux disease with esophagitis, without bleeding: Secondary | ICD-10-CM

## 2016-10-30 MED ORDER — PANTOPRAZOLE SODIUM 40 MG PO TBEC: 40.0000 mg | DELAYED_RELEASE_TABLET | Freq: Every day | ORAL | 11 refills | Status: DC

## 2016-10-30 NOTE — Assessment & Plan Note (Signed)
Previous HCV antibody positive. HCV RNA qualitative test negative. Given possibility of low level RNA back and intermittently be undetectable, we will repeat again. Recommendations generally are to repeat 2-3 times within one year and if all are negative then would assume that he truly is negative.

## 2016-10-30 NOTE — Assessment & Plan Note (Signed)
Decreased pantoprazole once daily. If he has recurrent reflux type symptoms they are to let us know. Return to the office in one year or sooner if needed.

## 2016-10-30 NOTE — Patient Instructions (Addendum)
1. Please have hepatic function profile, HCV RNA quantitative test done. See orders. 2. We plan on doing ColoGuard testing to screen for colon cancer (since you did not want to have a colonoscopy). Once it is approved by your insurance company we will get the stool collection kit to you. You will collect a specimen and return directly to the lab. Tests results will come to [place] and we will contact with you with further recommendations. 3. Decrease pantoprazole to 40 mg ONCE daily before breakfast. Prescription sent to pharmacy. 4. Return to the office in one year.

## 2016-10-30 NOTE — Assessment & Plan Note (Signed)
Patient declines colonoscopy. He is interested in Cologuard testing. We will make arrangements. Patient agrees that if he tests positive that he will pursue colonoscopy at that point.

## 2016-10-30 NOTE — Progress Notes (Signed)
      Primary Care Physician: Lauretta Grill, NP  Primary Gastroenterologist:  Garfield Cornea, MD   Chief Complaint  Patient presents with  . Hepatitis C    doing ok    HPI: Danny Greene is a 56 y.o. male here for follow-up. He has a history of severe reflux esophagitis on EGD in September 2016. Follow-up EGD January 2017 with possible short segment Barrett's, previous severe esophagitis had resolved. Biopsy did not confirm Barrett's. No prior colonoscopy, patient has declined in the past. Patient has history of positive HCV antibody but negative HCV RNA qualitative test last year.   Clinically he reports that he is doing well. He's gained 20 pounds since one year ago. Appetite is good. No heartburn on medication. Bowel function normal. Denies blood in the stool or melena. He is not interested in colonoscopy but is interested in Cologuard testing.    Current Outpatient Prescriptions  Medication Sig Dispense Refill  . clozapine (CLOZARIL) 200 MG tablet Take 200 mg by mouth daily.    . fluvoxaMINE (LUVOX) 100 MG tablet Take 100 mg by mouth at bedtime. sleep    . pantoprazole (PROTONIX) 40 MG tablet Take 1 tablet (40 mg total) by mouth 2 (two) times daily. 60 tablet 2  . senna (SENOKOT) 8.6 MG TABS tablet Take 2 tablets by mouth at bedtime.      No current facility-administered medications for this visit.     Allergies as of 10/30/2016  . (No Known Allergies)    ROS:  General: Negative for anorexia, weight loss, fever, chills, fatigue, weakness. ENT: Negative for hoarseness, difficulty swallowing , nasal congestion. CV: Negative for chest pain, angina, palpitations, dyspnea on exertion, peripheral edema.  Respiratory: Negative for dyspnea at rest, dyspnea on exertion, cough, sputum, wheezing.  GI: See history of present illness. GU:  Negative for dysuria, hematuria, urinary incontinence, urinary frequency, nocturnal urination.  Endo: Negative for unusual weight change.      Physical Examination:   BP 116/83   Pulse 97   Temp 97.4 F (36.3 C) (Oral)   Ht 5\' 11"  (1.803 m)   Wt 191 lb 12.8 oz (87 kg)   BMI 26.75 kg/m   General: Well-nourished, well-developed in no acute distress.  Eyes: No icterus. Mouth: Oropharyngeal mucosa moist and pink , no lesions erythema or exudate. Lungs: Clear to auscultation bilaterally.  Heart: Regular rate and rhythm, no murmurs rubs or gallops.  Abdomen: Bowel sounds are normal, nontender, nondistended, no hepatosplenomegaly or masses, no abdominal bruits or hernia , no rebound or guarding.   Extremities: No lower extremity edema. No clubbing or deformities. Neuro: Alert and oriented x 4   Skin: Warm and dry, no jaundice.   Psych: Alert and cooperative, normal mood and affect.

## 2016-11-02 NOTE — Progress Notes (Signed)
CC'ED TO PCP 

## 2017-01-16 ENCOUNTER — Telehealth: Payer: Self-pay | Admitting: Gastroenterology

## 2017-01-16 NOTE — Telephone Encounter (Signed)
Received a fax from [employer] regarding patient's failure to submit stool specimen for cologuard. Due to 60 days without activity (failure for patient to return sample) the order has been suspended. If patient returns sample within 365 days of initial order they will process it. No need to reorder.   Please reach outpatient and request him to submit stool specimen for colofuard testing.

## 2017-01-17 NOTE — Telephone Encounter (Signed)
Tried to call pt- NA 

## 2017-01-23 NOTE — Telephone Encounter (Signed)
Letter and cologuard information mailed to the pt.

## 2017-01-23 NOTE — Telephone Encounter (Signed)
Tried to call pt- NA 

## 2017-01-28 ENCOUNTER — Emergency Department (HOSPITAL_COMMUNITY): Payer: Medicaid Other

## 2017-01-28 ENCOUNTER — Inpatient Hospital Stay (HOSPITAL_COMMUNITY)
Admission: EM | Admit: 2017-01-28 | Discharge: 2017-02-01 | DRG: 381 | Payer: Medicaid Other | Attending: Internal Medicine | Admitting: Internal Medicine

## 2017-01-28 ENCOUNTER — Encounter (HOSPITAL_COMMUNITY): Payer: Self-pay | Admitting: Emergency Medicine

## 2017-01-28 DIAGNOSIS — K449 Diaphragmatic hernia without obstruction or gangrene: Secondary | ICD-10-CM

## 2017-01-28 DIAGNOSIS — R112 Nausea with vomiting, unspecified: Secondary | ICD-10-CM | POA: Diagnosis present

## 2017-01-28 DIAGNOSIS — R1110 Vomiting, unspecified: Secondary | ICD-10-CM

## 2017-01-28 DIAGNOSIS — F209 Schizophrenia, unspecified: Secondary | ICD-10-CM | POA: Diagnosis present

## 2017-01-28 DIAGNOSIS — K209 Esophagitis, unspecified without bleeding: Secondary | ICD-10-CM | POA: Diagnosis present

## 2017-01-28 DIAGNOSIS — E860 Dehydration: Secondary | ICD-10-CM | POA: Diagnosis present

## 2017-01-28 DIAGNOSIS — N179 Acute kidney failure, unspecified: Secondary | ICD-10-CM | POA: Diagnosis not present

## 2017-01-28 DIAGNOSIS — E876 Hypokalemia: Secondary | ICD-10-CM | POA: Diagnosis present

## 2017-01-28 DIAGNOSIS — B3781 Candidal esophagitis: Secondary | ICD-10-CM | POA: Diagnosis present

## 2017-01-28 DIAGNOSIS — K2210 Ulcer of esophagus without bleeding: Principal | ICD-10-CM | POA: Diagnosis present

## 2017-01-28 LAB — COMPREHENSIVE METABOLIC PANEL
ALBUMIN: 4.6 g/dL (ref 3.5–5.0)
ALT: 57 U/L (ref 17–63)
ANION GAP: 18 — AB (ref 5–15)
AST: 64 U/L — ABNORMAL HIGH (ref 15–41)
Alkaline Phosphatase: 102 U/L (ref 38–126)
BUN: 32 mg/dL — ABNORMAL HIGH (ref 6–20)
CHLORIDE: 88 mmol/L — AB (ref 101–111)
CO2: 35 mmol/L — AB (ref 22–32)
Calcium: 10.4 mg/dL — ABNORMAL HIGH (ref 8.9–10.3)
Creatinine, Ser: 1.93 mg/dL — ABNORMAL HIGH (ref 0.61–1.24)
GFR calc Af Amer: 43 mL/min — ABNORMAL LOW (ref >60)
GFR calc non Af Amer: 37 mL/min — ABNORMAL LOW (ref >60)
GLUCOSE: 169 mg/dL — AB (ref 65–99)
Potassium: 4.2 mmol/L (ref 3.5–5.1)
SODIUM: 141 mmol/L (ref 135–145)
Total Bilirubin: 0.6 mg/dL (ref 0.3–1.2)
Total Protein: 8.4 g/dL — ABNORMAL HIGH (ref 6.5–8.1)

## 2017-01-28 LAB — CBC WITH DIFFERENTIAL/PLATELET
Basophils Absolute: 0 10*3/uL (ref 0.0–0.1)
Basophils Relative: 0 %
Eosinophils Absolute: 0 10*3/uL (ref 0.0–0.7)
Eosinophils Relative: 0 %
HEMATOCRIT: 43.5 % (ref 39.0–52.0)
HEMOGLOBIN: 14.6 g/dL (ref 13.0–17.0)
LYMPHS PCT: 6 %
Lymphs Abs: 1.3 10*3/uL (ref 0.7–4.0)
MCH: 33.3 pg (ref 26.0–34.0)
MCHC: 33.6 g/dL (ref 30.0–36.0)
MCV: 99.3 fL (ref 78.0–100.0)
MONO ABS: 1.8 10*3/uL — AB (ref 0.1–1.0)
MONOS PCT: 8 %
NEUTROS ABS: 18 10*3/uL — AB (ref 1.7–7.7)
Neutrophils Relative %: 86 %
Platelets: 330 10*3/uL (ref 150–400)
RBC: 4.38 MIL/uL (ref 4.22–5.81)
RDW: 12.2 % (ref 11.5–15.5)
WBC: 21.1 10*3/uL — ABNORMAL HIGH (ref 4.0–10.5)

## 2017-01-28 LAB — ETHANOL: Alcohol, Ethyl (B): <5 mg/dL (ref <5)

## 2017-01-28 LAB — RAPID URINE DRUG SCREEN, HOSP PERFORMED
AMPHETAMINES: NOT DETECTED
BENZODIAZEPINES: NOT DETECTED
Barbiturates: NOT DETECTED
COCAINE: NOT DETECTED
Opiates: NOT DETECTED
Tetrahydrocannabinol: NOT DETECTED

## 2017-01-28 LAB — TYPE AND SCREEN
ABO/RH(D): A NEG
ANTIBODY SCREEN: NEGATIVE

## 2017-01-28 LAB — LIPASE, BLOOD: Lipase: 25 U/L (ref 11–51)

## 2017-01-28 LAB — PROTIME-INR
INR: 1.11
Prothrombin Time: 14.4 seconds (ref 11.4–15.2)

## 2017-01-28 LAB — APTT: aPTT: 26 seconds (ref 24–36)

## 2017-01-28 LAB — POC OCCULT BLOOD, ED: FECAL OCCULT BLD: NEGATIVE

## 2017-01-28 LAB — AMMONIA: Ammonia: 14 umol/L (ref 9–35)

## 2017-01-28 MED ORDER — SODIUM CHLORIDE 0.9 % IV SOLN
INTRAVENOUS | Status: DC
  Administered 2017-01-28: 19:00:00 via INTRAVENOUS

## 2017-01-28 MED ORDER — DOXYCYCLINE HYCLATE 100 MG IV SOLR
INTRAVENOUS | Status: AC
  Filled 2017-01-28: qty 100

## 2017-01-28 MED ORDER — SODIUM CHLORIDE 0.9 % IV SOLN
80.0000 mg | Freq: Once | INTRAVENOUS | Status: AC
  Administered 2017-01-28: 80 mg via INTRAVENOUS
  Filled 2017-01-28: qty 80

## 2017-01-28 MED ORDER — PANTOPRAZOLE SODIUM 40 MG IV SOLR
INTRAVENOUS | Status: AC
  Filled 2017-01-28: qty 80

## 2017-01-28 MED ORDER — SODIUM CHLORIDE 0.9 % IV BOLUS (SEPSIS)
1000.0000 mL | Freq: Once | INTRAVENOUS | Status: AC
  Administered 2017-01-28: 1000 mL via INTRAVENOUS

## 2017-01-28 MED ORDER — ONDANSETRON HCL 4 MG/2ML IJ SOLN
4.0000 mg | Freq: Once | INTRAMUSCULAR | Status: AC
  Administered 2017-01-28: 4 mg via INTRAVENOUS
  Filled 2017-01-28: qty 2

## 2017-01-28 NOTE — ED Provider Notes (Signed)
[facility] Provider Note   CSN: [id number] Arrival date & time: 01/28/17  1846     History   Chief Complaint Chief Complaint  Patient presents with  . Emesis    HPI Danny Greene is a 56 y.o. male.  HPI Pt Presents to the emergency room for evaluation of vomiting and diarrhea. History is limited by the patient. He is a very poor historian. Patient states he has trouble with memory loss.  According to the EMS report the patient started having vomiting and weakness according to staff at his group home. On the way to the emergency room the patient had an episode of dark emesis. Patient denies any abdominal pain. He denies any rectal bleeding. He denies any fevers or chills. According to the medical records patient has a history of severe esophagitis in the past.  Patient denies any drug use. He states he just drinks alcohol every 1 or 2 weeks. Past Medical History:  Diagnosis Date  . Constipation   . GERD (gastroesophageal reflux disease)   . Hepatitis C antibody test positive    HCV RNA negative 11/2015  . Hypertension   . Schizophrenia [facility])     Patient Active Problem List   Diagnosis Date Noted  . Colon cancer screening 10/30/2016  . Hepatitis C antibody test positive 11/01/2015    Class: History of  . Mucosal abnormality of esophagus   . Dysphagia   . 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  . Intractable vomiting with nausea 06/09/2015  . Dehydration 06/09/2015  . Periumbilical abdominal pain   . Esophagitis 05/26/2015  . Schizophrenia, unspecified type ([place]) 05/26/2015  . Tobacco abuse 05/26/2015  . Abnormal CT scan, esophagus   . Hiatal hernia   . Reflux esophagitis   . Acute esophagitis   . Abdominal pain 05/25/2015  . Hypokalemia 05/25/2015    Past Surgical History:  Procedure Laterality Date  . CHOLECYSTECTOMY    . ESOPHAGEAL DILATION N/A 05/26/2015   Procedure: ESOPHAGEAL DILATION;  Surgeon: Daneil Dolin, MD;  [facility];  Service:  Endoscopy;  Laterality: N/A;  . ESOPHAGEAL DILATION  09/21/2015   Procedure: ESOPHAGEAL DILATION;  Surgeon: Daneil Dolin, MD;  Location: [facility];  Service: Endoscopy;;  . ESOPHAGOGASTRODUODENOSCOPY N/A 05/26/2015   RMR: Severe ulcerative esophagitits ad described. Status post biopsy. Hiatal hernia.   Marland Kitchen ESOPHAGOGASTRODUODENOSCOPY N/A 09/21/2015   RMR: Abnormal distal esophagus query short segment Barretts status post biopsy. Severe esophagitis seen previously has resolved. Hiatal hernia. Status post maloney dilation prior to biopsy. No barrett's  . KIDNEY STONE SURGERY         Home Medications    Prior to Admission medications   Medication Sig Start Date End Date Taking? Authorizing Provider  Cholecalciferol (VITAMIN D) 2000 units CAPS Take 1 capsule by mouth daily.   Yes [provider]  clozapine (CLOZARIL) 200 MG tablet Take 200 mg by mouth at bedtime.    Yes [provider]  fluvoxaMINE (LUVOX) 100 MG tablet Take 100 mg by mouth at bedtime. sleep   Yes [provider]  gemfibrozil (LOPID) 600 MG tablet Take 600 mg by mouth 2 (two) times daily before a meal.   Yes [provider]  lisinopril (PRINIVIL,ZESTRIL) 10 MG tablet Take 10 mg by mouth daily.   Yes [provider]  ondansetron (ZOFRAN-ODT) 4 MG disintegrating tablet Take 4 mg by mouth every 8 (eight) hours as needed for nausea or vomiting.   Yes [provider]  pantoprazole (PROTONIX) 40 MG tablet Take 1 tablet (40 mg total) by mouth daily. 10/30/16  Yes Mahala Menghini, PA-C  senna (SENOKOT) 8.6 MG TABS tablet Take 2 tablets by mouth at bedtime.    Yes [provider]    Family History Family History  Problem Relation Age of Onset  . Liver disease Neg Hx   . Colon cancer Neg Hx     Social History Social History  Substance Use Topics  . Smoking status: Current Every Day Smoker    Packs/day: 1.00    Years: 1.00    Types: Cigarettes  . Smokeless  tobacco: Never Used  . Alcohol use Yes     Comment: states "liquor every chance I can"     Allergies   Patient has no known allergies.   Review of Systems Review of Systems  All other systems reviewed and are negative.    Physical Exam Updated Vital Signs BP 118/83   Pulse 80   Temp 98.2 F (36.8 C) (Oral)   Resp 18   Ht 1.803 m (5\' 11" )   Wt 82.6 kg (182 lb)   SpO2 93%   BMI 25.38 kg/m   Physical Exam  Constitutional: No distress.  HENT:  Head: Normocephalic and atraumatic.  Right Ear: External ear normal.  Left Ear: External ear normal.  Eyes: Conjunctivae are normal. Right eye exhibits no discharge. Left eye exhibits no discharge. No scleral icterus.  Neck: Neck supple. No tracheal deviation present.  Cardiovascular: Regular rhythm and intact distal pulses.  Tachycardia present.   Pulmonary/Chest: Effort normal and breath sounds normal. No stridor. No respiratory distress. He has no wheezes. He has no rales.  Abdominal: Soft. Bowel sounds are normal. He exhibits no distension. There is no tenderness. There is no rebound and no guarding.  Genitourinary:  Genitourinary Comments: Brown stool, guaiac negative  Musculoskeletal: He exhibits no edema or tenderness.  Neurological: He is alert. He has normal strength. No cranial nerve deficit (no facial droop, extraocular movements intact, no slurred speech) or sensory deficit. He exhibits normal muscle tone. He displays no seizure activity. Coordination normal.  Skin: Skin is warm and dry. No rash noted. He is not diaphoretic.  Psychiatric: He has a normal mood and affect.  Nursing note and vitals reviewed.    ED Treatments / Results  Labs (all labs ordered are listed, but only abnormal results are displayed) Labs Reviewed  COMPREHENSIVE METABOLIC PANEL - Abnormal; Notable for the following:       Result Value   Chloride 88 (*)    CO2 35 (*)    Glucose, Bld 169 (*)    BUN 32 (*)    Creatinine, Ser 1.93 (*)     Calcium 10.4 (*)    Total Protein 8.4 (*)    AST 64 (*)    GFR calc non Af Amer 37 (*)    GFR calc Af Amer 43 (*)    Anion gap 18 (*)    All other components within normal limits  CBC WITH DIFFERENTIAL/PLATELET - Abnormal; Notable for the following:    WBC 21.1 (*)    Neutro Abs 18.0 (*)    Monocytes Absolute 1.8 (*)    All other components within normal limits  APTT  PROTIME-INR  AMMONIA  ETHANOL  RAPID URINE DRUG SCREEN, HOSP PERFORMED  LIPASE, BLOOD  POC OCCULT BLOOD, ED  TYPE AND SCREEN    EKG  EKG Interpretation  Date/Time:  Monday Jan 28 2017  19:53:02 EDT Ventricular Rate:  105 PR Interval:    QRS Duration: 88 QT Interval:  379 QTC Calculation: 501 R Axis:   -34 Text Interpretation:  Sinus tachycardia Left axis deviation Consider anterior infarct Prolonged QT interval No significant change since last tracing Confirmed by Dorie Rank [phone]) on 01/28/2017 8:02:55 PM       Radiology Ct Abdomen Pelvis Wo Contrast  Result Date: 01/28/2017 CLINICAL DATA:  Abdominal pain and vomiting since 01/27/2017. EXAM: CT ABDOMEN AND PELVIS WITHOUT CONTRAST TECHNIQUE: Multidetector CT imaging of the abdomen and pelvis was performed following the standard protocol without IV contrast. COMPARISON:  05/25/2015 CT FINDINGS: Lower chest: Moderate-sized hiatal hernia with mild transmural thickening of the visualized distal esophagus, question esophagitis. Dependent atelectasis at the lung bases. Normal visualized cardiac chambers. No effusion or pneumothorax. Hepatobiliary: The liver is diffusely hypodense consistent with hepatic steatosis. The gallbladder is physiologically distended without calculus. Pancreas: Unremarkable. No pancreatic ductal dilatation or surrounding inflammatory changes. Spleen: Normal in size without focal abnormality. Adrenals/Urinary Tract: Adrenal glands are unremarkable. Kidneys are normal, without renal calculi, focal lesion, or hydronephrosis. Bladder is  unremarkable. Stomach/Bowel: Stomach is within normal limits. Appendix appears normal. No evidence of bowel wall thickening, distention, or inflammatory changes. Diffuse colonic spasm. Diffuse mild submucosal fatty infiltration of the cecum through distal descending colon may reflect sequela of chronic inflammatory bowel. Vascular/Lymphatic: No significant vascular findings are present. No enlarged abdominal or pelvic lymph nodes. Reproductive: Prostate is unremarkable. Other: Tiny fat containing umbilical hernia. No abdominopelvic ascites. Musculoskeletal: Chronic L1 compression fracture. No acute nor suspicious osseous lesions. IMPRESSION: 1. Moderate-sized hiatal hernia is again noted with mild distal esophageal mucosal thickening question esophagitis. 2. No bowel obstruction. Diffuse colonic spasm. Fatty infiltration of the colon as above described may reflect sequela of chronic inflammatory bowel disease. 3. Hepatic steatosis. Electronically Signed   By: Ashley Royalty M.D.   On: 01/28/2017 21:31   Dg Chest 1 View  Result Date: 01/28/2017 CLINICAL DATA:  Shortness of breath. EXAM: CHEST 1 VIEW COMPARISON:  Radiographs of June 09, 2015. FINDINGS: The heart size and mediastinal contours are within normal limits. Both lungs are clear. No pneumothorax or pleural effusion is noted. Probable small hiatal hernia is noted. The visualized skeletal structures are unremarkable. IMPRESSION: Probable small hiatal hernia. No acute cardiopulmonary abnormality seen. Electronically Signed   By: Marijo Conception, M.D.   On: 01/28/2017 19:50    Procedures Procedures (including critical care time)  Medications Ordered in ED Medications  0.9 %  sodium chloride infusion ( Intravenous Stopped 01/28/17 2253)  sodium chloride 0.9 % bolus 1,000 mL (not administered)  ondansetron (ZOFRAN) injection 4 mg (4 mg Intravenous Given 01/28/17 1925)  pantoprazole (PROTONIX) 80 mg in sodium chloride 0.9 % 100 mL IVPB (0 mg Intravenous  Stopped 01/28/17 2015)     Initial Impression / Assessment and Plan / ED Course  I have reviewed the triage vital signs and the nursing notes.  Pertinent labs & imaging results that were available during my care of the patient were reviewed by me and considered in my medical decision making (see chart for details).  Clinical Course as of Jan 28 2329  Mon Jan 28, 2017  2035 Coffee ground emesis noted in emesis bag.  No melena noted on rectal exam.  [JK]    Clinical Course User Index [JK] Dorie Rank, MD    Patient presented to the emergency room with coffee ground type emesis.  His symptoms were concerning  for the possibility of acute GI bleed.  However, his hemoglobin is normal and his fecal occult is negative for blood.  Patient did have a significant leukocytosis. CT scan shows findings consistent with a hiatal hernia.  Suspect the patient's symptoms are related to gastritis, possibly esophagitis.  Will admit for iv hydration, further treatment  Final Clinical Impressions(s) / ED Diagnoses   Final diagnoses:  Non-intractable vomiting with nausea, unspecified vomiting type  Acute kidney injury ([place])  Dehydration  Hiatal hernia    New Prescriptions New Prescriptions   No medications on file     Dorie Rank, MD 01/28/17 2334

## 2017-01-28 NOTE — ED Triage Notes (Addendum)
Patient brought in by EMS from [facility] group home complaining of vomiting and weakness since yesterday. Per EMS, patient vomited dark red blood while en route to ER.

## 2017-01-28 NOTE — H&P (Signed)
History and Physical    Danny Greene EQA:[id number] DOB: August 06, 1961 DOA: 01/28/2017  PCP: Lauretta Grill, NP  Patient coming from:  Group home.    Chief Complaint:  Nausea and vomiting coffee ground.   HPI: Danny Greene is an 56 y.o. male with hx of schizophrenia, Hep C, recurrent esophagitis, H/H, lives in group home and brought to the ER as he was reportedly had several emesis of coffee ground.  He still has some nausea, and he had some emesis in the ER, but it was not coffee ground.  He was guaic negative.  Work up included stable hemodynamics, and Hb of 14 g per dL, with increase to Cr of 1.9, and BUN of 32.  He does have a WBC of 21K, but no fever, coughs, abdominal pain, diarrhea, or GU complaints.  Hospitalist was asked to admit him for intractable nausea and vomiting, volume depletion, and leukocytosis.  His CXR was clear, UA pending, lipase is normal.    ED Course:  See above.  Rewiew of Systems:  Constitutional: Negative for malaise, fever and chills. No significant weight loss or weight gain Eyes: Negative for eye pain, redness and discharge, diplopia, visual changes, or flashes of light. ENMT: Negative for ear pain, hoarseness, nasal congestion, sinus pressure and sore throat. No headaches; tinnitus, drooling, or problem swallowing. Cardiovascular: Negative for chest pain, palpitations, diaphoresis, dyspnea and peripheral edema. ; No orthopnea, PND Respiratory: Negative for cough, hemoptysis, wheezing and stridor. No pleuritic chestpain. Gastrointestinal: Negative for diarrhea, constipation,  melena, blood in stool, hematemesis, jaundice and rectal bleeding.    Genitourinary: Negative for frequency, dysuria, incontinence,flank pain and hematuria; Musculoskeletal: Negative for back pain and neck pain. Negative for swelling and trauma.;  Skin: . Negative for pruritus, rash, abrasions, bruising and skin lesion.; ulcerations Neuro: Negative for headache, lightheadedness and  neck stiffness. Negative for weakness, altered level of consciousness , altered mental status, extremity weakness, burning feet, involuntary movement, seizure and syncope.  Psych: negative for anxiety, depression, insomnia, tearfulness, panic attacks, hallucinations, paranoia, suicidal or homicidal ideation    Past Medical History:  Diagnosis Date  . Constipation   . GERD (gastroesophageal reflux disease)   . Hepatitis C antibody test positive    HCV RNA negative 11/2015  . Hypertension   . Schizophrenia [facility])     Past Surgical History:  Procedure Laterality Date  . CHOLECYSTECTOMY    . ESOPHAGEAL DILATION N/A 05/26/2015   Procedure: ESOPHAGEAL DILATION;  Surgeon: Daneil Dolin, MD;  [facility];  Service: Endoscopy;  Laterality: N/A;  . ESOPHAGEAL DILATION  09/21/2015   Procedure: ESOPHAGEAL DILATION;  Surgeon: Daneil Dolin, MD;  [facility];  Service: Endoscopy;;  . ESOPHAGOGASTRODUODENOSCOPY N/A 05/26/2015   RMR: Severe ulcerative esophagitits ad described. Status post biopsy. Hiatal hernia.   Marland Kitchen ESOPHAGOGASTRODUODENOSCOPY N/A 09/21/2015   RMR: Abnormal distal esophagus query short segment Barretts status post biopsy. Severe esophagitis seen previously has resolved. Hiatal hernia. Status post maloney dilation prior to biopsy. No barrett's  . KIDNEY STONE SURGERY       reports that he has been smoking Cigarettes.  He has a 1.00 pack-year smoking history. He has never used smokeless tobacco. He reports that he drinks alcohol. He reports that he does not use drugs.  No Known Allergies  Family History  Problem Relation Age of Onset  . Liver disease Neg Hx   . Colon cancer Neg Hx      Prior to Admission medications  Medication Sig Start Date End Date Taking? Authorizing Provider  Cholecalciferol (VITAMIN D) 2000 units CAPS Take 1 capsule by mouth daily.   Yes [provider]  clozapine (CLOZARIL) 200 MG tablet Take 200 mg by mouth at bedtime.     Yes [provider]  fluvoxaMINE (LUVOX) 100 MG tablet Take 100 mg by mouth at bedtime. sleep   Yes [provider]  gemfibrozil (LOPID) 600 MG tablet Take 600 mg by mouth 2 (two) times daily before a meal.   Yes [provider]  lisinopril (PRINIVIL,ZESTRIL) 10 MG tablet Take 10 mg by mouth daily.   Yes [provider]  ondansetron (ZOFRAN-ODT) 4 MG disintegrating tablet Take 4 mg by mouth every 8 (eight) hours as needed for nausea or vomiting.   Yes [provider]  pantoprazole (PROTONIX) 40 MG tablet Take 1 tablet (40 mg total) by mouth daily. 10/30/16  Yes Mahala Menghini, PA-C  senna (SENOKOT) 8.6 MG TABS tablet Take 2 tablets by mouth at bedtime.    Yes [provider]    Physical Exam: Vitals:   01/28/17 2100 01/28/17 2130 01/28/17 2200 01/28/17 2345  BP: (!) 110/95 117/87 118/83 113/83  Pulse: 93 98 80 (!) 105  Resp: 11 20 18 16   Temp:      TempSrc:      SpO2: 97% 90% 93% 93%  Weight:      Height:          Constitutional: NAD, calm, comfortable Vitals:   01/28/17 2100 01/28/17 2130 01/28/17 2200 01/28/17 2345  BP: (!) 110/95 117/87 118/83 113/83  Pulse: 93 98 80 (!) 105  Resp: 11 20 18 16   Temp:      TempSrc:      SpO2: 97% 90% 93% 93%  Weight:      Height:       Eyes: PERRL, lids and conjunctivae normal ENMT: Mucous membranes are moist. Posterior pharynx clear of any exudate or lesions.Normal dentition.  Neck: normal, supple, no masses, no thyromegaly Respiratory: clear to auscultation bilaterally, no wheezing, no crackles. Normal respiratory effort. No accessory muscle use.  Cardiovascular: Regular rate and rhythm, no murmurs / rubs / gallops. No extremity edema. 2+ pedal pulses. No carotid bruits.  Abdomen: no tenderness, no masses palpated. No hepatosplenomegaly. Bowel sounds positive.  Musculoskeletal: no clubbing / cyanosis. No joint deformity upper and lower extremities. Good ROM, no contractures. Normal  muscle tone.  Skin: no rashes, lesions, ulcers. No induration Neurologic: CN 2-12 grossly intact. Sensation intact, DTR normal. Strength 5/5 in all 4.  Psychiatric: Normal judgment and insight. Alert and oriented x 3. Normal mood.     Labs on Admission: I have personally reviewed following labs and imaging studies  CBC:  Recent Labs Lab 01/28/17 1934  WBC 21.1*  NEUTROABS 18.0*  HGB 14.6  HCT 43.5  MCV 99.3  PLT 858   Basic Metabolic Panel:  Recent Labs Lab 01/28/17 1934  NA 141  K 4.2  CL 88*  CO2 35*  GLUCOSE 169*  BUN 32*  CREATININE 1.93*  CALCIUM 10.4*   GFR: Estimated Creatinine Clearance: 45.5 mL/min (A) (by C-G formula based on SCr of 1.93 mg/dL (H)). Liver Function Tests:  Recent Labs Lab 01/28/17 1934  AST 64*  ALT 57  ALKPHOS 102  BILITOT 0.6  PROT 8.4*  ALBUMIN 4.6    Recent Labs Lab 01/28/17 1934  LIPASE 25    Recent Labs Lab 01/28/17 1934  AMMONIA 14  Coagulation Profile:  Recent Labs Lab 01/28/17 1934  INR 1.11   Urine analysis:    Component Value Date/Time   COLORURINE YELLOW 06/09/2015 1540   APPEARANCEUR CLEAR 06/09/2015 1540   APPEARANCEUR Clear 12/24/2014 1603   LABSPEC 1.020 06/09/2015 1540   LABSPEC 1.011 12/24/2014 1603   PHURINE 6.0 06/09/2015 1540   GLUCOSEU NEGATIVE 06/09/2015 1540   GLUCOSEU Negative 12/24/2014 1603   HGBUR TRACE (A) 06/09/2015 1540   BILIRUBINUR SMALL (A) 06/09/2015 1540   BILIRUBINUR Negative 12/24/2014 1603   KETONESUR 40 (A) 06/09/2015 1540   PROTEINUR TRACE (A) 06/09/2015 1540   UROBILINOGEN 0.2 06/09/2015 1540   NITRITE NEGATIVE 06/09/2015 1540   LEUKOCYTESUR NEGATIVE 06/09/2015 1540   LEUKOCYTESUR Negative 12/24/2014 1603     Radiological Exams on Admission: Ct Abdomen Pelvis Wo Contrast  Result Date: 01/28/2017 CLINICAL DATA:  Abdominal pain and vomiting since 01/27/2017. EXAM: CT ABDOMEN AND PELVIS WITHOUT CONTRAST TECHNIQUE: Multidetector CT imaging of the abdomen and  pelvis was performed following the standard protocol without IV contrast. COMPARISON:  05/25/2015 CT FINDINGS: Lower chest: Moderate-sized hiatal hernia with mild transmural thickening of the visualized distal esophagus, question esophagitis. Dependent atelectasis at the lung bases. Normal visualized cardiac chambers. No effusion or pneumothorax. Hepatobiliary: The liver is diffusely hypodense consistent with hepatic steatosis. The gallbladder is physiologically distended without calculus. Pancreas: Unremarkable. No pancreatic ductal dilatation or surrounding inflammatory changes. Spleen: Normal in size without focal abnormality. Adrenals/Urinary Tract: Adrenal glands are unremarkable. Kidneys are normal, without renal calculi, focal lesion, or hydronephrosis. Bladder is unremarkable. Stomach/Bowel: Stomach is within normal limits. Appendix appears normal. No evidence of bowel wall thickening, distention, or inflammatory changes. Diffuse colonic spasm. Diffuse mild submucosal fatty infiltration of the cecum through distal descending colon may reflect sequela of chronic inflammatory bowel. Vascular/Lymphatic: No significant vascular findings are present. No enlarged abdominal or pelvic lymph nodes. Reproductive: Prostate is unremarkable. Other: Tiny fat containing umbilical hernia. No abdominopelvic ascites. Musculoskeletal: Chronic L1 compression fracture. No acute nor suspicious osseous lesions. IMPRESSION: 1. Moderate-sized hiatal hernia is again noted with mild distal esophageal mucosal thickening question esophagitis. 2. No bowel obstruction. Diffuse colonic spasm. Fatty infiltration of the colon as above described may reflect sequela of chronic inflammatory bowel disease. 3. Hepatic steatosis. Electronically Signed   By: Ashley Royalty M.D.   On: 01/28/2017 21:31   Dg Chest 1 View  Result Date: 01/28/2017 CLINICAL DATA:  Shortness of breath. EXAM: CHEST 1 VIEW COMPARISON:  Radiographs of June 09, 2015.  FINDINGS: The heart size and mediastinal contours are within normal limits. Both lungs are clear. No pneumothorax or pleural effusion is noted. Probable small hiatal hernia is noted. The visualized skeletal structures are unremarkable. IMPRESSION: Probable small hiatal hernia. No acute cardiopulmonary abnormality seen. Electronically Signed   By: Marijo Conception, M.D.   On: 01/28/2017 19:50    EKG: Independently reviewed.   Assessment/Plan Active Problems:   Hiatal hernia   Schizophrenia, unspecified type (H[facility])   Dehydration   Ulcerative esophagitis   Nausea & vomiting    PLAN:   Intractable nausea and vomiting:  Unclear etiology.  Could be a viral gastroenteritis.  Will Tx symptomatically.  Give IVF and anti emetics.   Coffee ground emesis:  Not actively bleeding, and Guaic negative.  Suspect esosphagitis.  Will follow H and H in the morning.  Continue with PPI.  Volume depletion:  AKI.  Will give IVF, d/c ACE I for now.  Repeat BUN/ Cr in the morning.  Schizophrenia:  Continue with antipsychotic meds.  He lives in a group home.   DVT prophylaxis: SCD.  Code Status: FULL CODE.  Family Communication: None.  Disposition Plan: To group home when he came from  Consults called: None. Admission status: OBS>    Ferne Ellingwood MD FACP. [facility]  If 7PM-7AM, please contact night-coverage [email or web address] Password [email or web address]  01/28/2017, 11:50 PM

## 2017-01-28 NOTE — ED Notes (Signed)
Patient transported to CT 

## 2017-01-28 NOTE — ED Notes (Signed)
Admitting provider at bedside.

## 2017-01-29 DIAGNOSIS — K209 Esophagitis, unspecified: Secondary | ICD-10-CM | POA: Diagnosis not present

## 2017-01-29 DIAGNOSIS — F209 Schizophrenia, unspecified: Secondary | ICD-10-CM | POA: Diagnosis not present

## 2017-01-29 DIAGNOSIS — K2210 Ulcer of esophagus without bleeding: Secondary | ICD-10-CM | POA: Diagnosis not present

## 2017-01-29 DIAGNOSIS — R1111 Vomiting without nausea: Secondary | ICD-10-CM | POA: Diagnosis not present

## 2017-01-29 DIAGNOSIS — N179 Acute kidney failure, unspecified: Secondary | ICD-10-CM | POA: Diagnosis present

## 2017-01-29 DIAGNOSIS — E860 Dehydration: Secondary | ICD-10-CM | POA: Diagnosis not present

## 2017-01-29 DIAGNOSIS — E876 Hypokalemia: Secondary | ICD-10-CM | POA: Diagnosis present

## 2017-01-29 DIAGNOSIS — R112 Nausea with vomiting, unspecified: Secondary | ICD-10-CM | POA: Diagnosis not present

## 2017-01-29 DIAGNOSIS — B3781 Candidal esophagitis: Secondary | ICD-10-CM | POA: Diagnosis present

## 2017-01-29 DIAGNOSIS — K449 Diaphragmatic hernia without obstruction or gangrene: Secondary | ICD-10-CM | POA: Diagnosis not present

## 2017-01-29 LAB — URINALYSIS, ROUTINE W REFLEX MICROSCOPIC
BILIRUBIN URINE: NEGATIVE
GLUCOSE, UA: NEGATIVE mg/dL
Ketones, ur: NEGATIVE mg/dL
LEUKOCYTES UA: NEGATIVE
NITRITE: NEGATIVE
PH: 6 (ref 5.0–8.0)
Protein, ur: 100 mg/dL — AB
Specific Gravity, Urine: 1.021 (ref 1.005–1.030)

## 2017-01-29 LAB — BASIC METABOLIC PANEL
Anion gap: 10 (ref 5–15)
BUN: 24 mg/dL — AB (ref 6–20)
CALCIUM: 8.9 mg/dL (ref 8.9–10.3)
CO2: 33 mmol/L — ABNORMAL HIGH (ref 22–32)
CREATININE: 1.12 mg/dL (ref 0.61–1.24)
Chloride: 95 mmol/L — ABNORMAL LOW (ref 101–111)
Glucose, Bld: 136 mg/dL — ABNORMAL HIGH (ref 65–99)
Potassium: 2.9 mmol/L — ABNORMAL LOW (ref 3.5–5.1)
Sodium: 138 mmol/L (ref 135–145)

## 2017-01-29 LAB — CBC
HCT: 37.5 % — ABNORMAL LOW (ref 39.0–52.0)
Hemoglobin: 12.3 g/dL — ABNORMAL LOW (ref 13.0–17.0)
MCH: 33 pg (ref 26.0–34.0)
MCHC: 32.8 g/dL (ref 30.0–36.0)
MCV: 100.5 fL — ABNORMAL HIGH (ref 78.0–100.0)
PLATELETS: 278 10*3/uL (ref 150–400)
RBC: 3.73 MIL/uL — ABNORMAL LOW (ref 4.22–5.81)
RDW: 12.3 % (ref 11.5–15.5)
WBC: 22.2 10*3/uL — ABNORMAL HIGH (ref 4.0–10.5)

## 2017-01-29 MED ORDER — POTASSIUM CHLORIDE 20 MEQ PO PACK
40.0000 meq | PACK | ORAL | Status: AC
  Administered 2017-01-29 (×2): 40 meq via ORAL
  Filled 2017-01-29 (×2): qty 2

## 2017-01-29 MED ORDER — SODIUM CHLORIDE 0.9 % IV SOLN
INTRAVENOUS | Status: DC
  Administered 2017-01-29: 01:00:00 via INTRAVENOUS

## 2017-01-29 MED ORDER — SENNA 8.6 MG PO TABS
2.0000 | ORAL_TABLET | Freq: Every day | ORAL | Status: DC
  Administered 2017-01-29 – 2017-01-31 (×2): 17.2 mg via ORAL
  Filled 2017-01-29 (×2): qty 2

## 2017-01-29 MED ORDER — PANTOPRAZOLE SODIUM 40 MG PO TBEC
40.0000 mg | DELAYED_RELEASE_TABLET | Freq: Every day | ORAL | Status: DC
  Administered 2017-01-29: 40 mg via ORAL
  Filled 2017-01-29: qty 1

## 2017-01-29 MED ORDER — FLUVOXAMINE MALEATE 50 MG PO TABS
100.0000 mg | ORAL_TABLET | Freq: Every day | ORAL | Status: DC
  Administered 2017-01-29 – 2017-01-31 (×4): 100 mg via ORAL
  Filled 2017-01-29 (×3): qty 2
  Filled 2017-01-29: qty 1
  Filled 2017-01-29 (×2): qty 2
  Filled 2017-01-29: qty 1

## 2017-01-29 MED ORDER — GEMFIBROZIL 600 MG PO TABS
600.0000 mg | ORAL_TABLET | Freq: Two times a day (BID) | ORAL | Status: DC
  Administered 2017-01-29 – 2017-02-01 (×7): 600 mg via ORAL
  Filled 2017-01-29 (×7): qty 1

## 2017-01-29 MED ORDER — CLOZAPINE 100 MG PO TABS
200.0000 mg | ORAL_TABLET | Freq: Every day | ORAL | Status: DC
Start: 2017-01-29 — End: 2017-02-02
  Administered 2017-01-29 – 2017-01-31 (×4): 200 mg via ORAL
  Filled 2017-01-29 (×7): qty 2

## 2017-01-29 MED ORDER — ONDANSETRON HCL 4 MG/2ML IJ SOLN
4.0000 mg | Freq: Four times a day (QID) | INTRAMUSCULAR | Status: DC | PRN
  Administered 2017-01-29: 4 mg via INTRAVENOUS
  Filled 2017-01-29: qty 2

## 2017-01-29 MED ORDER — VITAMIN D 1000 UNITS PO TABS
2000.0000 [IU] | ORAL_TABLET | Freq: Every day | ORAL | Status: DC
  Administered 2017-01-29 – 2017-01-31 (×3): 2000 [IU] via ORAL
  Filled 2017-01-29 (×3): qty 2

## 2017-01-29 MED ORDER — CLOZAPINE 100 MG PO TABS
ORAL_TABLET | ORAL | Status: AC
  Filled 2017-01-29: qty 2

## 2017-01-29 MED ORDER — DEXTROSE-NACL 5-0.9 % IV SOLN
INTRAVENOUS | Status: DC
  Administered 2017-01-29 – 2017-01-31 (×4): via INTRAVENOUS

## 2017-01-29 MED ORDER — PANTOPRAZOLE SODIUM 40 MG IV SOLR
40.0000 mg | Freq: Two times a day (BID) | INTRAVENOUS | Status: DC
  Administered 2017-01-29 – 2017-01-31 (×4): 40 mg via INTRAVENOUS
  Filled 2017-01-29 (×5): qty 40

## 2017-01-29 MED ORDER — PROMETHAZINE HCL 25 MG/ML IJ SOLN
6.2500 mg | Freq: Four times a day (QID) | INTRAMUSCULAR | Status: DC | PRN
  Administered 2017-01-31: 6.25 mg via INTRAVENOUS
  Filled 2017-01-29 (×2): qty 1

## 2017-01-29 MED ORDER — ONDANSETRON HCL 4 MG PO TABS: 4.0000 mg | ORAL_TABLET | Freq: Four times a day (QID) | ORAL | Status: DC | PRN

## 2017-01-29 MED ORDER — SUCRALFATE 1 GM/10ML PO SUSP
1.0000 g | Freq: Three times a day (TID) | ORAL | Status: DC
  Administered 2017-01-29 – 2017-01-30 (×2): 1 g via ORAL
  Filled 2017-01-29 (×6): qty 10

## 2017-01-29 NOTE — Progress Notes (Signed)
PROGRESS NOTE    Danny Greene  YIF:[id number] DOB: 1961/05/04 DOA: 01/28/2017 PCP: Lauretta Grill, NP    Brief Narrative:  56 yo male who presented to the hospital with the chief complain of nausea and coffee ground emesis. Patient from group home, know to have schizophrenia, Hep C and recurrent esophagitis. Patient developed persistent emesis with coffee ground material. On the initial physical examination, blood pressure 110/95, HR 93, RR11 and oxygen saturation 97%. Mucous membranes moist, lungs clear to auscultation, heart with rhythmic S1 and S2, abdomen soft and non tender, lower extremities with no edema. Patient admitted for intractable nausea and vomiting.    Assessment & Plan:   Active Problems:   Hiatal hernia   Schizophrenia, unspecified type (HCC)   Dehydration   Ulcerative esophagitis   Nausea & vomiting   1. Intractable nausea and vomiting. Symptoms improving, will continue medical therapy with IV antiemetics, IV antiacids and IV fluids. Will decrease rate to 75 cc/hr of IV fluids to prevent volume overload. Will change protonix to 40 mg IV bid. Change to clear liquid diet and will advance as tolerated. Old records personally reviewed GI office visit 10/2016 with recommendations to continue antiacid therapy only once daily. Patient had endoscopy on  09/2015 with resolving esophagitis. Hb and Hct stable. If worsening symptoms may need endoscopic evaluation, will hold on GI consult for now. Add sucralfate with meals.   2. Schizophrenia. Patient with no confusion or agitation, will continue clozapine, luvox,   3. Dehydration with AKI and hypokalemia. Serum cr down to 1.12 from admission 1,93, will continue hydration with saline at 75 cc/hr, continue repletion of K with Kcl, (80 meq) follow renal panel in am, avoid hypotension and nephrotoxic medications. Diet with clear liquids.      DVT prophylaxis: scd Code Status: Full  Family Communication:  Disposition Plan:     Consultants:     Procedures:    Antimicrobials:   Subjective: Nausea and vomiting improving, no chest pain. Coffee ground emesis occur after several episodes of vomiting, no significant abdominal pain, no diarrhea. No fever or chills.   Objective: Vitals:   01/29/17 0000 01/29/17 0017 01/29/17 0500 01/29/17 0851  BP: 114/78 128/85 119/75 111/78  Pulse: 86 93 (!) 101 (!) 114  Resp:  18 18 20   Temp:  98.1 F (36.7 C) 98.2 F (36.8 C) 98.2 F (36.8 C)  TempSrc:  Oral Oral Oral  SpO2: 95% 95% 93% 94%  Weight:  79.1 kg (174 lb 6.1 oz)  80.3 kg (177 lb 0.5 oz)  Height:  5\' 11"  (1.803 m)  5\' 11"  (1.803 m)    Intake/Output Summary (Last 24 hours) at 01/29/17 1108 Last data filed at 01/29/17 0600  Gross per 24 hour  Intake              480 ml  Output                0 ml  Net              480 ml   Filed Weights   01/28/17 1855 01/29/17 0017 01/29/17 0851  Weight: 82.6 kg (182 lb) 79.1 kg (174 lb 6.1 oz) 80.3 kg (177 lb 0.5 oz)    Examination:  General exam: deconditioned E ENT. Mild pallor, no icterus, oral mucosa dry.  Respiratory system: Clear to auscultation. Respiratory effort normal. No wheezing, rales or rhonchi.  Cardiovascular system: S1 & S2 heard, RRR. No JVD, murmurs, rubs, gallops or clicks.  No pedal edema. Gastrointestinal system: Abdomen is mildly distended, soft and nontender. No organomegaly or masses felt. Normal bowel sounds heard. Central nervous system: Alert and oriented. No focal neurological deficits. Extremities: Symmetric 5 x 5 power. Skin: No rashes, lesions or ulcers     Data Reviewed: I have personally reviewed following labs and imaging studies  CBC:  Recent Labs Lab 01/28/17 1934 01/29/17 0538  WBC 21.1* 22.2*  NEUTROABS 18.0*  --   HGB 14.6 12.3*  HCT 43.5 37.5*  MCV 99.3 100.5*  PLT 330 295   Basic Metabolic Panel:  Recent Labs Lab 01/28/17 1934 01/29/17 0538  NA 141 138  K 4.2 2.9*  CL 88* 95*  CO2 35* 33*   GLUCOSE 169* 136*  BUN 32* 24*  CREATININE 1.93* 1.12  CALCIUM 10.4* 8.9   GFR: Estimated Creatinine Clearance: 78.4 mL/min (by C-G formula based on SCr of 1.12 mg/dL). Liver Function Tests:  Recent Labs Lab 01/28/17 1934  AST 64*  ALT 57  ALKPHOS 102  BILITOT 0.6  PROT 8.4*  ALBUMIN 4.6    Recent Labs Lab 01/28/17 1934  LIPASE 25    Recent Labs Lab 01/28/17 1934  AMMONIA 14   Coagulation Profile:  Recent Labs Lab 01/28/17 1934  INR 1.11   Cardiac Enzymes: No results for input(s): CKTOTAL, CKMB, CKMBINDEX, TROPONINI in the last 168 hours. BNP (last 3 results) No results for input(s): PROBNP in the last 8760 hours. HbA1C: No results for input(s): HGBA1C in the last 72 hours. CBG: No results for input(s): GLUCAP in the last 168 hours. Lipid Profile: No results for input(s): CHOL, HDL, LDLCALC, TRIG, CHOLHDL, LDLDIRECT in the last 72 hours. Thyroid Function Tests: No results for input(s): TSH, T4TOTAL, FREET4, T3FREE, THYROIDAB in the last 72 hours. Anemia Panel: No results for input(s): VITAMINB12, FOLATE, FERRITIN, TIBC, IRON, RETICCTPCT in the last 72 hours. Sepsis Labs: No results for input(s): PROCALCITON, LATICACIDVEN in the last 168 hours.  No results found for this or any previous visit (from the past 240 hour(s)).       Radiology Studies: Ct Abdomen Pelvis Wo Contrast  Result Date: 01/28/2017 CLINICAL DATA:  Abdominal pain and vomiting since 01/27/2017. EXAM: CT ABDOMEN AND PELVIS WITHOUT CONTRAST TECHNIQUE: Multidetector CT imaging of the abdomen and pelvis was performed following the standard protocol without IV contrast. COMPARISON:  05/25/2015 CT FINDINGS: Lower chest: Moderate-sized hiatal hernia with mild transmural thickening of the visualized distal esophagus, question esophagitis. Dependent atelectasis at the lung bases. Normal visualized cardiac chambers. No effusion or pneumothorax. Hepatobiliary: The liver is diffusely hypodense  consistent with hepatic steatosis. The gallbladder is physiologically distended without calculus. Pancreas: Unremarkable. No pancreatic ductal dilatation or surrounding inflammatory changes. Spleen: Normal in size without focal abnormality. Adrenals/Urinary Tract: Adrenal glands are unremarkable. Kidneys are normal, without renal calculi, focal lesion, or hydronephrosis. Bladder is unremarkable. Stomach/Bowel: Stomach is within normal limits. Appendix appears normal. No evidence of bowel wall thickening, distention, or inflammatory changes. Diffuse colonic spasm. Diffuse mild submucosal fatty infiltration of the cecum through distal descending colon may reflect sequela of chronic inflammatory bowel. Vascular/Lymphatic: No significant vascular findings are present. No enlarged abdominal or pelvic lymph nodes. Reproductive: Prostate is unremarkable. Other: Tiny fat containing umbilical hernia. No abdominopelvic ascites. Musculoskeletal: Chronic L1 compression fracture. No acute nor suspicious osseous lesions. IMPRESSION: 1. Moderate-sized hiatal hernia is again noted with mild distal esophageal mucosal thickening question esophagitis. 2. No bowel obstruction. Diffuse colonic spasm. Fatty infiltration of the colon as above  described may reflect sequela of chronic inflammatory bowel disease. 3. Hepatic steatosis. Electronically Signed   By: Ashley Royalty M.D.   On: 01/28/2017 21:31   Dg Chest 1 View  Result Date: 01/28/2017 CLINICAL DATA:  Shortness of breath. EXAM: CHEST 1 VIEW COMPARISON:  Radiographs of June 09, 2015. FINDINGS: The heart size and mediastinal contours are within normal limits. Both lungs are clear. No pneumothorax or pleural effusion is noted. Probable small hiatal hernia is noted. The visualized skeletal structures are unremarkable. IMPRESSION: Probable small hiatal hernia. No acute cardiopulmonary abnormality seen. Electronically Signed   By: Marijo Conception, M.D.   On: 01/28/2017 19:50         Scheduled Meds: . cholecalciferol  2,000 Units Oral Daily  . cloZAPine  200 mg Oral QHS  . fluvoxaMINE  100 mg Oral QHS  . gemfibrozil  600 mg Oral BID AC  . pantoprazole  40 mg Oral Daily  . senna  2 tablet Oral QHS   Continuous Infusions: . sodium chloride Stopped (01/28/17 2253)  . sodium chloride 100 mL/hr at 01/29/17 0600     LOS: 0 days       Rachna Schonberger Gerome Apley, MD [facility] Pager [phone]  If 7PM-7AM, please contact night-coverage [email or web address] Password [email or web address] 01/29/2017, 11:08 AM

## 2017-01-29 NOTE — ED Notes (Signed)
No vomiting while in the ED

## 2017-01-30 DIAGNOSIS — N179 Acute kidney failure, unspecified: Secondary | ICD-10-CM

## 2017-01-30 LAB — CBC WITH DIFFERENTIAL/PLATELET
Basophils Absolute: 0 10*3/uL (ref 0.0–0.1)
Basophils Relative: 0 %
EOS ABS: 0 10*3/uL (ref 0.0–0.7)
Eosinophils Relative: 0 %
HCT: 31.8 % — ABNORMAL LOW (ref 39.0–52.0)
Hemoglobin: 11 g/dL — ABNORMAL LOW (ref 13.0–17.0)
LYMPHS ABS: 1.9 10*3/uL (ref 0.7–4.0)
Lymphocytes Relative: 17 %
MCH: 34.3 pg — AB (ref 26.0–34.0)
MCHC: 34.6 g/dL (ref 30.0–36.0)
MCV: 99.1 fL (ref 78.0–100.0)
MONOS PCT: 9 %
Monocytes Absolute: 1 10*3/uL (ref 0.1–1.0)
Neutro Abs: 8.1 10*3/uL — ABNORMAL HIGH (ref 1.7–7.7)
Neutrophils Relative %: 74 %
PLATELETS: 242 10*3/uL (ref 150–400)
RBC: 3.21 MIL/uL — ABNORMAL LOW (ref 4.22–5.81)
RDW: 11.9 % (ref 11.5–15.5)
WBC: 11 10*3/uL — AB (ref 4.0–10.5)

## 2017-01-30 LAB — BASIC METABOLIC PANEL
Anion gap: 9 (ref 5–15)
BUN: 14 mg/dL (ref 6–20)
CO2: 28 mmol/L (ref 22–32)
CREATININE: 0.94 mg/dL (ref 0.61–1.24)
Calcium: 8.6 mg/dL — ABNORMAL LOW (ref 8.9–10.3)
Chloride: 101 mmol/L (ref 101–111)
GFR calc Af Amer: >60 mL/min (ref >60)
GFR calc non Af Amer: >60 mL/min (ref >60)
GLUCOSE: 119 mg/dL — AB (ref 65–99)
POTASSIUM: 3.2 mmol/L — AB (ref 3.5–5.1)
SODIUM: 138 mmol/L (ref 135–145)

## 2017-01-30 LAB — HIV ANTIBODY (ROUTINE TESTING W REFLEX): HIV Screen 4th Generation wRfx: NONREACTIVE

## 2017-01-30 NOTE — Care Management Note (Signed)
Case Management Note  Patient Details  Name: Danny Greene MRN: [id number] Date of Birth: 1961/06/06  Subjective/Objective:                  Pt admitted with ulcerative esophagitis. He is from [facility]. He will return at DC. Pt ind with ADL's, has PCP, transportation and insurance with drug coverage.   Action/Plan: CSW following to make arrangements for return to facility at DC. No CM needs at this time.   Expected Discharge Date:       01/30/2017           Expected Discharge Plan:  Group Home  In-House Referral:  Clinical Social Work  Discharge planning Services  CM Consult  Post Acute Care Choice:  NA Choice offered to:  NA  Status of Service:  Completed, signed off  Sherald Barge, RN 01/30/2017, 2:57 PM

## 2017-01-30 NOTE — Progress Notes (Signed)
Patient reported "he vomited after he ate his lunch", patient states" his stomach feels better now that he has vomited".

## 2017-01-30 NOTE — Clinical Social Work Note (Signed)
LCSW contacted patient's facility and relative listed on chart and received no answer. Patient is from [facility].  He will return at discharge.

## 2017-01-30 NOTE — Progress Notes (Signed)
PROGRESS NOTE    Danny Greene  NLZ:[id number] DOB: [birth year] DOA: 01/28/2017 PCP: Lauretta Grill, NP    Brief Narrative:  55 year old male with a history of schizophrenia, has intercurrent esophagitis, presented with intractable nausea and vomiting coffee-ground emesis. His diet has slowly been advanced, but he has persistent vomiting when eating solid food. GI has been consulted.   Assessment & Plan:   Active Problems:   Hiatal hernia   Esophagitis   Schizophrenia, unspecified type (HCC)   Dehydration   Ulcerative esophagitis   Nausea & vomiting   1. Intractable nausea or vomiting. Patient appeared to be tolerating liquids ., pulmonary venous systolic flow began vomiting. Continue antiemetics, PPIs and IV fluids. He was last seen by GI in 10/2016. He had an endoscopy in 09/2015. Since symptoms are persisting, will consult GI to see if repeat endoscopic evaluation is necessary. 2. Schizophrenia. Continue clozapine, luvox 3. Dehydration with a cane hypokalemia. Improved with IV fluids and electrolyte replacement.   DVT prophylaxis: scd Code Status: full code Family Communication: no family present Disposition Plan: discharge back to group home once improved   Consultants:     Procedures:     Antimicrobials:      Subjective: Tolerating clear liquids. No abdominal pain  Objective: Vitals:   01/29/17 1310 01/30/17 0007 01/30/17 0557 01/30/17 1445  BP: 106/71 108/68 130/61 106/63  Pulse: 91 (!) 104 (!) 112 77  Resp: 16 16 18 17   Temp: 98.7 F (37.1 C) 98.9 F (37.2 C) 98.6 F (37 C) 98.6 F (37 C)  TempSrc: Oral Oral Oral Oral  SpO2: 94% 95% 96% 95%  Weight:      Height:        Intake/Output Summary (Last 24 hours) at 01/30/17 1707 Last data filed at 01/30/17 1500  Gross per 24 hour  Intake          1763.75 ml  Output                0 ml  Net          1763.75 ml   Filed Weights   01/28/17 1855 01/29/17 0017 01/29/17 0851  Weight: 82.6 kg (182  lb) 79.1 kg (174 lb 6.1 oz) 80.3 kg (177 lb 0.5 oz)    Examination:  General exam: Appears calm and comfortable  Respiratory system: Clear to auscultation. Respiratory effort normal. Cardiovascular system: S1 & S2 heard, RRR. No JVD, murmurs, rubs, gallops or clicks. No pedal edema. Gastrointestinal system: Abdomen is nondistended, soft and nontender. No organomegaly or masses felt. Normal bowel sounds heard. Central nervous system: Alert and oriented. No focal neurological deficits. Extremities: Symmetric 5 x 5 power. Skin: No rashes, lesions or ulcers Psychiatry: Judgement and insight appear normal. Mood & affect appropriate.     Data Reviewed: I have personally reviewed following labs and imaging studies  CBC:  Recent Labs Lab 01/28/17 1934 01/29/17 0538 01/30/17 0602  WBC 21.1* 22.2* 11.0*  NEUTROABS 18.0*  --  8.1*  HGB 14.6 12.3* 11.0*  HCT 43.5 37.5* 31.8*  MCV 99.3 100.5* 99.1  PLT 330 278 902   Basic Metabolic Panel:  Recent Labs Lab 01/28/17 1934 01/29/17 0538 01/30/17 0602  NA 141 138 138  K 4.2 2.9* 3.2*  CL 88* 95* 101  CO2 35* 33* 28  GLUCOSE 169* 136* 119*  BUN 32* 24* 14  CREATININE 1.93* 1.12 0.94  CALCIUM 10.4* 8.9 8.6*   GFR: Estimated Creatinine Clearance: 93.5 mL/min (by C-G formula  based on SCr of 0.94 mg/dL). Liver Function Tests:  Recent Labs Lab 01/28/17 1934  AST 64*  ALT 57  ALKPHOS 102  BILITOT 0.6  PROT 8.4*  ALBUMIN 4.6    Recent Labs Lab 01/28/17 1934  LIPASE 25    Recent Labs Lab 01/28/17 1934  AMMONIA 14   Coagulation Profile:  Recent Labs Lab 01/28/17 1934  INR 1.11   Cardiac Enzymes: No results for input(s): CKTOTAL, CKMB, CKMBINDEX, TROPONINI in the last 168 hours. BNP (last 3 results) No results for input(s): PROBNP in the last 8760 hours. HbA1C: No results for input(s): HGBA1C in the last 72 hours. CBG: No results for input(s): GLUCAP in the last 168 hours. Lipid Profile: No results for  input(s): CHOL, HDL, LDLCALC, TRIG, CHOLHDL, LDLDIRECT in the last 72 hours. Thyroid Function Tests: No results for input(s): TSH, T4TOTAL, FREET4, T3FREE, THYROIDAB in the last 72 hours. Anemia Panel: No results for input(s): VITAMINB12, FOLATE, FERRITIN, TIBC, IRON, RETICCTPCT in the last 72 hours. Sepsis Labs: No results for input(s): PROCALCITON, LATICACIDVEN in the last 168 hours.  No results found for this or any previous visit (from the past 240 hour(s)).       Radiology Studies: Ct Abdomen Pelvis Wo Contrast  Result Date: 01/28/2017 CLINICAL DATA:  Abdominal pain and vomiting since 01/27/2017. EXAM: CT ABDOMEN AND PELVIS WITHOUT CONTRAST TECHNIQUE: Multidetector CT imaging of the abdomen and pelvis was performed following the standard protocol without IV contrast. COMPARISON:  05/25/2015 CT FINDINGS: Lower chest: Moderate-sized hiatal hernia with mild transmural thickening of the visualized distal esophagus, question esophagitis. Dependent atelectasis at the lung bases. Normal visualized cardiac chambers. No effusion or pneumothorax. Hepatobiliary: The liver is diffusely hypodense consistent with hepatic steatosis. The gallbladder is physiologically distended without calculus. Pancreas: Unremarkable. No pancreatic ductal dilatation or surrounding inflammatory changes. Spleen: Normal in size without focal abnormality. Adrenals/Urinary Tract: Adrenal glands are unremarkable. Kidneys are normal, without renal calculi, focal lesion, or hydronephrosis. Bladder is unremarkable. Stomach/Bowel: Stomach is within normal limits. Appendix appears normal. No evidence of bowel wall thickening, distention, or inflammatory changes. Diffuse colonic spasm. Diffuse mild submucosal fatty infiltration of the cecum through distal descending colon may reflect sequela of chronic inflammatory bowel. Vascular/Lymphatic: No significant vascular findings are present. No enlarged abdominal or pelvic lymph nodes.  Reproductive: Prostate is unremarkable. Other: Tiny fat containing umbilical hernia. No abdominopelvic ascites. Musculoskeletal: Chronic L1 compression fracture. No acute nor suspicious osseous lesions. IMPRESSION: 1. Moderate-sized hiatal hernia is again noted with mild distal esophageal mucosal thickening question esophagitis. 2. No bowel obstruction. Diffuse colonic spasm. Fatty infiltration of the colon as above described may reflect sequela of chronic inflammatory bowel disease. 3. Hepatic steatosis. Electronically Signed   By: Ashley Royalty M.D.   On: 01/28/2017 21:31   Dg Chest 1 View  Result Date: 01/28/2017 CLINICAL DATA:  Shortness of breath. EXAM: CHEST 1 VIEW COMPARISON:  Radiographs of June 09, 2015. FINDINGS: The heart size and mediastinal contours are within normal limits. Both lungs are clear. No pneumothorax or pleural effusion is noted. Probable small hiatal hernia is noted. The visualized skeletal structures are unremarkable. IMPRESSION: Probable small hiatal hernia. No acute cardiopulmonary abnormality seen. Electronically Signed   By: Marijo Conception, M.D.   On: 01/28/2017 19:50        Scheduled Meds: . cholecalciferol  2,000 Units Oral Daily  . cloZAPine  200 mg Oral QHS  . fluvoxaMINE  100 mg Oral QHS  . gemfibrozil  600 mg  Oral BID AC  . pantoprazole (PROTONIX) IV  40 mg Intravenous Q12H  . senna  2 tablet Oral QHS  . sucralfate  1 g Oral TID WC & HS   Continuous Infusions: . dextrose 5 % and 0.9% NaCl 75 mL/hr at 01/30/17 0900     LOS: 1 day    Time spent:16mins    Alper Guilmette, MD [facility] Pager [phone]  If 7PM-7AM, please contact night-coverage [email or web address] Password [email or web address] 01/30/2017, 5:07 PM

## 2017-01-30 NOTE — Plan of Care (Signed)
Problem: Pain Managment: Goal: General experience of comfort will improve Outcome: Adequate for Discharge Pt has no c/o pain this shift. Pt resting comfortably in bed with no complaints. Will continue to monitor pt

## 2017-01-30 NOTE — Progress Notes (Signed)
Report given by Denyse Dago, RN. Patient resting in bed at this time. No distress noted. Denies needs.

## 2017-01-31 ENCOUNTER — Encounter (HOSPITAL_COMMUNITY): Payer: Self-pay | Admitting: Certified Registered Nurse Anesthetist

## 2017-01-31 DIAGNOSIS — R1111 Vomiting without nausea: Secondary | ICD-10-CM

## 2017-01-31 DIAGNOSIS — K2210 Ulcer of esophagus without bleeding: Principal | ICD-10-CM

## 2017-01-31 DIAGNOSIS — K209 Esophagitis, unspecified: Secondary | ICD-10-CM

## 2017-01-31 DIAGNOSIS — F209 Schizophrenia, unspecified: Secondary | ICD-10-CM

## 2017-01-31 DIAGNOSIS — K449 Diaphragmatic hernia without obstruction or gangrene: Secondary | ICD-10-CM

## 2017-01-31 DIAGNOSIS — R112 Nausea with vomiting, unspecified: Secondary | ICD-10-CM

## 2017-01-31 DIAGNOSIS — E860 Dehydration: Secondary | ICD-10-CM

## 2017-01-31 LAB — BASIC METABOLIC PANEL
Anion gap: 5 (ref 5–15)
BUN: 10 mg/dL (ref 6–20)
CHLORIDE: 106 mmol/L (ref 101–111)
CO2: 27 mmol/L (ref 22–32)
Calcium: 8.5 mg/dL — ABNORMAL LOW (ref 8.9–10.3)
Creatinine, Ser: 0.76 mg/dL (ref 0.61–1.24)
GFR calc non Af Amer: >60 mL/min (ref >60)
Glucose, Bld: 110 mg/dL — ABNORMAL HIGH (ref 65–99)
POTASSIUM: 3.2 mmol/L — AB (ref 3.5–5.1)
SODIUM: 138 mmol/L (ref 135–145)

## 2017-01-31 LAB — CBC
HCT: 30.2 % — ABNORMAL LOW (ref 39.0–52.0)
Hemoglobin: 10.4 g/dL — ABNORMAL LOW (ref 13.0–17.0)
MCH: 33.4 pg (ref 26.0–34.0)
MCHC: 34.4 g/dL (ref 30.0–36.0)
MCV: 97.1 fL (ref 78.0–100.0)
Platelets: 227 10*3/uL (ref 150–400)
RBC: 3.11 MIL/uL — ABNORMAL LOW (ref 4.22–5.81)
RDW: 11.8 % (ref 11.5–15.5)
WBC: 6.5 10*3/uL (ref 4.0–10.5)

## 2017-01-31 MED ORDER — PROPOFOL 10 MG/ML IV BOLUS
INTRAVENOUS | Status: AC
Start: 2017-01-31 — End: 2017-01-31
  Filled 2017-01-31: qty 40

## 2017-01-31 MED ORDER — SODIUM CHLORIDE 0.9 % IV SOLN
INTRAVENOUS | Status: DC
  Administered 2017-01-31: 14:00:00 via INTRAVENOUS

## 2017-01-31 MED ORDER — ONDANSETRON HCL 4 MG/2ML IJ SOLN
4.0000 mg | Freq: Three times a day (TID) | INTRAMUSCULAR | Status: DC
  Administered 2017-01-31 – 2017-02-01 (×4): 4 mg via INTRAVENOUS
  Filled 2017-01-31 (×3): qty 2

## 2017-01-31 MED ORDER — POTASSIUM CHLORIDE CRYS ER 20 MEQ PO TBCR
40.0000 meq | EXTENDED_RELEASE_TABLET | ORAL | Status: AC
  Administered 2017-01-31 (×2): 40 meq via ORAL
  Filled 2017-01-31: qty 2

## 2017-01-31 NOTE — Clinical Social Work Note (Signed)
Clinical Social Work Assessment  Patient Details  Name: Danny Greene MRN: [id number] Date of Birth: 09-11-1960  Date of referral:  01/30/17               Reason for consult:  Discharge Planning                Permission sought to share information with:    Permission granted to share information::     Name::        Agency::  [facility]  Relationship::     Contact Information:     Housing/Transportation Living arrangements for the past 2 months:  [place] of Information:  Facility Patient Interpreter Needed:  None Criminal Activity/Legal Involvement Pertinent to Current Situation/Hospitalization:  No - Comment as needed Significant Relationships:  None Lives with:  Facility Resident Do you feel safe going back to the place where you live?  Yes Need for family participation in patient care:  Yes (Comment)  Care giving concerns:  None identified.    Social Worker assessment / plan:  Robet Leu, facility staff, advised that patient has been a resident for years. Patient has no family and facility staff consider themselves to be family. At baseline, patient ambulates independently.  Mr. Berenice Primas stated that he feels that patient will need a cane upon return.  Staff provides assistance with ADLs.  Patient can return.   Employment status:  Disabled (Comment on whether or not currently receiving Disability) Insurance information:  Medicaid In [place] PT Recommendations:    Information / Referral to community resources:     Patient/Family's Response to care: Patient has been a resident for years and will be returning.   Patient/Family's Understanding of and Emotional Response to Diagnosis, Current Treatment, and Prognosis:  Staff understands diagnosis, treatment and program.   Emotional Assessment Appearance:  Appears stated age Attitude/Demeanor/Rapport:  Unable to Assess, Aggressive (Verbally and/or physically) Affect (typically  observed):  Unable to Assess Orientation:  Oriented to Self, Oriented to Place, Oriented to  Time, Oriented to Situation Alcohol / Substance use:  Not Applicable Psych involvement (Current and /or in the community):  No (Comment)  Discharge Needs  Concerns to be addressed:  No discharge needs identified Readmission within the last 30 days:  No Current discharge risk:  None Barriers to Discharge:  No Barriers Identified   Ihor Gully, LCSW 01/31/2017, 3:56 PM

## 2017-01-31 NOTE — Progress Notes (Signed)
PROGRESS NOTE    Danny Greene  FEO:[id number] DOB: 1961/05/03 DOA: 01/28/2017 PCP: Lauretta Grill, NP    Brief Narrative:  56 year old male with a history of schizophrenia, has intercurrent esophagitis, presented with intractable nausea and vomiting coffee-ground emesis. His diet has slowly been advanced, but he has persistent vomiting when eating solid food. GI has been consulted.   Assessment & Plan:   Active Problems:   Hiatal hernia   Esophagitis   Schizophrenia, unspecified type (HCC)   Dehydration   Ulcerative esophagitis   Nausea & vomiting   1. Intractable nausea or vomiting. Patient appeared to be tolerating liquids. He appears to start vomiting after having solids. Continue antiemetics, PPIs and IV fluids. GI following and plans on EGD in AM 2. Schizophrenia. Continue clozapine, luvox 3. Dehydration with AKI and hypokalemia. Improved with IV fluids and electrolyte replacement.   DVT prophylaxis: scd Code Status: full code Family Communication: no family present Disposition Plan: discharge back to group home once improved   Consultants:   gastroenterology  Procedures:     Antimicrobials:      Subjective: Tolerated breakfast this morning. No vomiting. Abdominal pain did not worsen after breakfast   Objective: Vitals:   01/30/17 2035 01/31/17 0702 01/31/17 0753 01/31/17 1400  BP: 111/85 110/76 103/70 (!) 88/58  Pulse: 81 75 79 70  Resp: 18 18 20 19   Temp: 98.5 F (36.9 C) 97.8 F (36.6 C) 97.5 F (36.4 C) 98.7 F (37.1 C)  TempSrc: Oral Oral Oral Oral  SpO2: 96% 95% 96% 96%  Weight:      Height:        Intake/Output Summary (Last 24 hours) at 01/31/17 1547 Last data filed at 01/31/17 1500  Gross per 24 hour  Intake          2921.33 ml  Output                0 ml  Net          2921.33 ml   Filed Weights   01/28/17 1855 01/29/17 0017 01/29/17 0851  Weight: 82.6 kg (182 lb) 79.1 kg (174 lb 6.1 oz) 80.3 kg (177 lb 0.5 oz)     Examination:  General exam: Alert, awake, oriented x 3 Respiratory system: Clear to auscultation. Respiratory effort normal. Cardiovascular system:RRR. No murmurs, rubs, gallops. Gastrointestinal system: Abdomen is nondistended, soft and nontender. No organomegaly or masses felt. Normal bowel sounds heard. Central nervous system: Alert and oriented. No focal neurological deficits. Extremities: No C/C/E, +pedal pulses Skin: No rashes, lesions or ulcers Psychiatry: Judgement and insight appear normal. Mood & affect appropriate.      Data Reviewed: I have personally reviewed following labs and imaging studies  CBC:  Recent Labs Lab 01/28/17 1934 01/29/17 0538 01/30/17 0602 01/31/17 0442  WBC 21.1* 22.2* 11.0* 6.5  NEUTROABS 18.0*  --  8.1*  --   HGB 14.6 12.3* 11.0* 10.4*  HCT 43.5 37.5* 31.8* 30.2*  MCV 99.3 100.5* 99.1 97.1  PLT 330 278 242 325   Basic Metabolic Panel:  Recent Labs Lab 01/28/17 1934 01/29/17 0538 01/30/17 0602 01/31/17 0442  NA 141 138 138 138  K 4.2 2.9* 3.2* 3.2*  CL 88* 95* 101 106  CO2 35* 33* 28 27  GLUCOSE 169* 136* 119* 110*  BUN 32* 24* 14 10  CREATININE 1.93* 1.12 0.94 0.76  CALCIUM 10.4* 8.9 8.6* 8.5*   GFR: Estimated Creatinine Clearance: 109.8 mL/min (by C-G formula based on SCr of  0.76 mg/dL). Liver Function Tests:  Recent Labs Lab 01/28/17 1934  AST 64*  ALT 57  ALKPHOS 102  BILITOT 0.6  PROT 8.4*  ALBUMIN 4.6    Recent Labs Lab 01/28/17 1934  LIPASE 25    Recent Labs Lab 01/28/17 1934  AMMONIA 14   Coagulation Profile:  Recent Labs Lab 01/28/17 1934  INR 1.11   Cardiac Enzymes: No results for input(s): CKTOTAL, CKMB, CKMBINDEX, TROPONINI in the last 168 hours. BNP (last 3 results) No results for input(s): PROBNP in the last 8760 hours. HbA1C: No results for input(s): HGBA1C in the last 72 hours. CBG: No results for input(s): GLUCAP in the last 168 hours. Lipid Profile: No results for  input(s): CHOL, HDL, LDLCALC, TRIG, CHOLHDL, LDLDIRECT in the last 72 hours. Thyroid Function Tests: No results for input(s): TSH, T4TOTAL, FREET4, T3FREE, THYROIDAB in the last 72 hours. Anemia Panel: No results for input(s): VITAMINB12, FOLATE, FERRITIN, TIBC, IRON, RETICCTPCT in the last 72 hours. Sepsis Labs: No results for input(s): PROCALCITON, LATICACIDVEN in the last 168 hours.  No results found for this or any previous visit (from the past 240 hour(s)).       Radiology Studies: No results found.      Scheduled Meds: . cholecalciferol  2,000 Units Oral Daily  . cloZAPine  200 mg Oral QHS  . fluvoxaMINE  100 mg Oral QHS  . gemfibrozil  600 mg Oral BID AC  . ondansetron (ZOFRAN) IV  4 mg Intravenous Q8H  . pantoprazole (PROTONIX) IV  40 mg Intravenous Q12H  . potassium chloride  40 mEq Oral Q3H  . senna  2 tablet Oral QHS  . sucralfate  1 g Oral TID WC & HS   Continuous Infusions: . sodium chloride 20 mL/hr at 01/31/17 1356  . dextrose 5 % and 0.9% NaCl 75 mL/hr at 01/31/17 1117     LOS: 2 days    Time spent:47mins    Danny Sally, MD [facility] Pager [phone]  If 7PM-7AM, please contact night-coverage [email or web address] Password [facility] 01/31/2017, 3:47 PM

## 2017-01-31 NOTE — Progress Notes (Signed)
Patient apparently had another bit of eggs to eat around 11:00 am. Procedure was cancelled by Dr. Gala Romney due to need to adequately visualize stomach. Will reschedule for tomorrow. Clears today, NPO after midnight.

## 2017-01-31 NOTE — Consult Note (Signed)
Referring Provider: [facility] Primary Care Physician:  Lauretta Grill, NP Primary Gastroenterologist:  Dr.Rourk  Date of Admission: 01/28/17 Date of Consultation: 01/31/17  Reason for Consultation:  Persistent vomiting  HPI:  Izak Anding is a 56 y.o. male with a past medical history of constipation, GERD, reflux esophagitis, Schizophrenia who was last seen by our office 10/27/16 for colon cancer screening and reflux esophagitis. At the time he is doing well, hadn't gained 20 pounds since the year prior, appetite good and no heartburn breakthrough on PPI. Bowel function was good at that time, no blood or melena. Not interested in colonoscopy but is amenable to all her screening but will agree to colonoscopy if his colovesical is positive.  His last upper endoscopy was completed 09/20/2016 for severe reflux esophagitis and dysphagia and found abnormal distal esophagus query short segment Barrett's esophagus status post biopsy, severe esophagitis seen previously has resolved, hiatal hernia. Status post [facility] dilation. Recommended decrease Protonix to 40 mg daily. Take Protonix daily for Barrett's esophagus. Follow-up in 2 months. Surgical pathology found  inflamed squamocolumnar epithelium with reactive changes.  The patient presented to the emergency department on 01/28/2017 for nausea, vomiting, diarrhea. Noted to be a poor historian. According to EMS the patient complained of vomiting and weakness at his group home but denied abdominal pain. In route to the emergency room he did have an episode of dark emesis. He also denied rectal bleeding, fever, chills to the emergency medical services team. Drinks alcohol every 1-2 weeks. Admitted to taking his Protonix. Labs showed leukocytosis with a white blood cell count of 21.1 in the emergency room his emesis bag found coffee ground emesis. The patient was admitted for further evaluation. Heme-negative stool in the ER. His hemoglobin in the  emergency department was 14. Vital signs stable. No vomiting in the emergency department. He was noted to be dehydrated in the emergency department with elevated creatinine.  He was started on IV antibiotics, fluids, and and "IV Antacids" which was changed to I Protonix 40 mg bid on 01/29/17. On the same day he was changed to clear liquid diet and advance as tolerated. On 01/30/2017 the patient vomited after eating lunch. At this point GI was consult.  His labs show hemoglobin of 14.63 days ago which is declined every day to 12.3, 11.0, 10.4. His leukocytosis is resolved and today it is 6.5. He is also had a drift downward and platelets indicating, and total, rehydration of a dehydrated state.  Today he states he started having nausea and vomiting the day he presented to the emergency department. He denies any alcohol use. States his nausea never significantly improved since admission but did not vomit until yesterday. Admits to vomit was black and coffee-ground. Denies any NSAIDs or aspirin powders. He admits to taking his PPI at home. Denies any hematochezia, abdominal pain. No other GI complaints. He is tired currently, but no chest pain, dizziness, passing out.   Past Medical History:  Diagnosis Date  . Constipation   . GERD (gastroesophageal reflux disease)   . Hepatitis C antibody test positive    HCV RNA negative 11/2015  . Hypertension   . Schizophrenia [facility])     Past Surgical History:  Procedure Laterality Date  . CHOLECYSTECTOMY    . ESOPHAGEAL DILATION N/A 05/26/2015   Procedure: ESOPHAGEAL DILATION;  Surgeon: Daneil Dolin, MD;  Location: [facility];  Service: Endoscopy;  Laterality: N/A;  . ESOPHAGEAL DILATION  09/21/2015   Procedure: ESOPHAGEAL DILATION;  Surgeon: Daneil Dolin, MD;  Location: [facility];  Service: Endoscopy;;  . ESOPHAGOGASTRODUODENOSCOPY N/A 05/26/2015   RMR: Severe ulcerative esophagitits ad described. Status post biopsy. Hiatal hernia.   Marland Kitchen  ESOPHAGOGASTRODUODENOSCOPY N/A 09/21/2015   RMR: Abnormal distal esophagus query short segment Barretts status post biopsy. Severe esophagitis seen previously has resolved. Hiatal hernia. Status post maloney dilation prior to biopsy. No barrett's  . KIDNEY STONE SURGERY      Prior to Admission medications   Medication Sig Start Date End Date Taking? Authorizing Provider  Cholecalciferol (VITAMIN D) 2000 units CAPS Take 1 capsule by mouth daily.   Yes [provider]  clozapine (CLOZARIL) 200 MG tablet Take 200 mg by mouth at bedtime.    Yes [provider]  fluvoxaMINE (LUVOX) 100 MG tablet Take 100 mg by mouth at bedtime. sleep   Yes [provider]  gemfibrozil (LOPID) 600 MG tablet Take 600 mg by mouth 2 (two) times daily before a meal.   Yes [provider]  lisinopril (PRINIVIL,ZESTRIL) 10 MG tablet Take 10 mg by mouth daily.   Yes [provider]  ondansetron (ZOFRAN-ODT) 4 MG disintegrating tablet Take 4 mg by mouth every 8 (eight) hours as needed for nausea or vomiting.   Yes [provider]  pantoprazole (PROTONIX) 40 MG tablet Take 1 tablet (40 mg total) by mouth daily. 10/30/16  Yes Mahala Menghini, PA-C  senna (SENOKOT) 8.6 MG TABS tablet Take 2 tablets by mouth at bedtime.    Yes [provider]    Current Facility-Administered Medications  Medication Dose Route Frequency Provider Last Rate Last Dose  . cholecalciferol (VITAMIN D) tablet 2,000 Units  2,000 Units Oral Daily Orvan Falconer, MD   2,000 Units at 01/30/17 0900  . cloZAPine (CLOZARIL) tablet 200 mg  200 mg Oral QHS Orvan Falconer, MD   200 mg at 01/30/17 2057  . dextrose 5 %-0.9 % sodium chloride infusion   Intravenous Continuous Tawni Millers, MD 75 mL/hr at 01/30/17 2103    . fluvoxaMINE (LUVOX) tablet 100 mg  100 mg Oral QHS Orvan Falconer, MD   100 mg at 01/30/17 2059  . gemfibrozil (LOPID) tablet 600 mg  600 mg Oral BID AC Orvan Falconer, MD   600 mg at 01/31/17  [id number]  . pantoprazole (PROTONIX) injection 40 mg  40 mg Intravenous Q12H Arrien, Jimmy Picket, MD   40 mg at 01/30/17 2059  . promethazine (PHENERGAN) injection 6.25 mg  6.25 mg Intravenous Q6H PRN Arrien, Jimmy Picket, MD   6.25 mg at 01/31/17 0835  . senna (SENOKOT) tablet 17.2 mg  2 tablet Oral QHS Orvan Falconer, MD   17.2 mg at 01/29/17 2109  . sucralfate (CARAFATE) 1 GM/10ML suspension 1 g  1 g Oral TID WC & HS Arrien, Jimmy Picket, MD   1 g at 01/30/17 2057    Allergies as of 01/28/2017  . (No Known Allergies)    Family History  Problem Relation Age of Onset  . Liver disease Neg Hx   . Colon cancer Neg Hx     Social History   Social History  . Marital status: Single    Spouse name: N/A  . Number of children: N/A  . Years of education: N/A   Occupational History  . Not on file.   Social History Main Topics  . Smoking status: Current Every Day Smoker    Packs/day: 1.00    Years: 1.00    Types: Cigarettes  .  Smokeless tobacco: Never Used  . Alcohol use Yes     Comment: states "liquor every chance I can"  . Drug use: No  . Sexual activity: Not Currently   Other Topics Concern  . Not on file   Social History Narrative  . No narrative on file    Review of Systems: General: Negative for anorexia, weight loss, fever, chills. Eyes: Negative for vision changes.  ENT: Negative for hoarseness, difficulty swallowing , nasal CV: Negative for chest pain, angina, palpitations, peripheral edema.  Respiratory: Negative for dyspnea at rest, cough, sputum, wheezing.  GI: See history of present illness. Derm: Negative for rash or itching.  Neuro: Negative for weakness, abnormal sensation, seizure, frequent headaches, memory loss, confusion.  Psych: Negative for anxiety, depression, suicidal ideation, hallucinations.  Endo: Negative for unusual weight change.  Heme: Negative for bruising or bleeding. Allergy: Negative for rash or hives.  Physical Exam: Vital signs in  last 24 hours: Temp:  [97.5 F (36.4 C)-98.6 F (37 C)] 97.5 F (36.4 C) (05/31 0753) Pulse Rate:  [75-81] 79 (05/31 0753) Resp:  [17-20] 20 (05/31 0753) BP: (103-111)/(63-85) 103/70 (05/31 0753) SpO2:  [95 %-96 %] 96 % (05/31 0753) Last BM Date: 01/30/17 (per pt/refusing Senna) General:   Alert,  Well-developed, well-nourished, pleasant and cooperative in NAD Head:  Normocephalic and atraumatic. Eyes:  Sclera clear, no icterus. Conjunctiva pink. Ears:  Normal auditory acuity. Nose:  No deformity, discharge,  or lesions. Lungs:  Clear throughout to auscultation.   No wheezes, crackles, or rhonchi. No acute distress. Heart:  Regular rate and rhythm; no murmurs, clicks, rubs,  or gallops. Abdomen:  Soft, nontender and nondistended. No masses, hepatosplenomegaly or hernias noted. Normal bowel sounds, without guarding, and without rebound.   Rectal:  Deferred.   Msk:  Symmetrical without gross deformities. Neurologic:  Alert and  oriented x4;  grossly normal neurologically. Skin:  Intact without significant lesions or rashes. Psych:  Alert and cooperative. Normal mood and affect.  Intake/Output from previous day: 05/30 0701 - 05/31 0700 In: 2813.8 [P.O.:1220; I.V.:1593.8] Out: -  Intake/Output this shift: No intake/output data recorded.  Lab Results:  Recent Labs  01/29/17 0538 01/30/17 0602 01/31/17 0442  WBC 22.2* 11.0* 6.5  HGB 12.3* 11.0* 10.4*  HCT 37.5* 31.8* 30.2*  PLT 278 242 227   BMET  Recent Labs  01/29/17 0538 01/30/17 0602 01/31/17 0442  NA 138 138 138  K 2.9* 3.2* 3.2*  CL 95* 101 106  CO2 33* 28 27  GLUCOSE 136* 119* 110*  BUN 24* 14 10  CREATININE 1.12 0.94 0.76  CALCIUM 8.9 8.6* 8.5*   LFT  Recent Labs  01/28/17 1934  PROT 8.4*  ALBUMIN 4.6  AST 64*  ALT 57  ALKPHOS 102  BILITOT 0.6   PT/INR  Recent Labs  01/28/17 1934  LABPROT 14.4  INR 1.11   Hepatitis Panel No results for input(s): HEPBSAG, HCVAB, HEPAIGM, HEPBIGM in the  last 72 hours. C-Diff No results for input(s): CDIFFTOX in the last 72 hours.  Studies/Results: No results found.  Impression: Patient with a history of severe esophagitis presented via EMS from a group home with complaints of nausea and vomiting. He did have coffee-ground emesis. His hemoglobin has trended downward over the past 3 days of admission. His other cell counts have likewise trended down and he was noted to be dehydrated on admission and some element of this is likely dehydration/rehydration affect. However, overall he has had a 4 g  drop in hemoglobin and has a history of severe esophagitis lending itself to upper GI bleed. He is not currently actively bleeding. He is having persistent nausea. Differentials include esophagitis, gastritis, Mallory-Weiss tear, gastric erosion, gastric ulcer disease, duodenal ulcer. He has had an upper endoscopy about 1-1/2 years ago.  Plan: 1. Continue PPI 2. Monitor for further bleeding 3. Monitor hgb 4. Scheduled Zofran in addition to Phenergan 5. Supportive measures 6. NPO 7. EGD with propofol this afternoon   Thank you for allowing [place] to participate in the care of Harrel Carina, DNP, AGNP-C Adult & Gerontological Nurse Practitioner [facility]    LOS: 2 days     01/31/2017, 9:18 AM

## 2017-02-01 ENCOUNTER — Encounter (HOSPITAL_COMMUNITY)
Admission: EM | Disposition: A | Discharge status: Other Acute Inpatient Hospital | Payer: Self-pay | Source: Home / Self Care | Attending: Internal Medicine

## 2017-02-01 ENCOUNTER — Inpatient Hospital Stay (HOSPITAL_COMMUNITY): Payer: Medicaid Other | Admitting: Anesthesiology

## 2017-02-01 ENCOUNTER — Encounter (HOSPITAL_COMMUNITY): Payer: Self-pay | Admitting: Anesthesiology

## 2017-02-01 DIAGNOSIS — B3781 Candidal esophagitis: Secondary | ICD-10-CM

## 2017-02-01 DIAGNOSIS — N179 Acute kidney failure, unspecified: Secondary | ICD-10-CM | POA: Diagnosis present

## 2017-02-01 DIAGNOSIS — E876 Hypokalemia: Secondary | ICD-10-CM

## 2017-02-01 HISTORY — PX: ESOPHAGOGASTRODUODENOSCOPY (EGD) WITH PROPOFOL: SHX5813

## 2017-02-01 LAB — CBC
HEMATOCRIT: 32.8 % — AB (ref 39.0–52.0)
HEMOGLOBIN: 11.3 g/dL — AB (ref 13.0–17.0)
MCH: 33.1 pg (ref 26.0–34.0)
MCHC: 34.5 g/dL (ref 30.0–36.0)
MCV: 96.2 fL (ref 78.0–100.0)
Platelets: 239 10*3/uL (ref 150–400)
RBC: 3.41 MIL/uL — AB (ref 4.22–5.81)
RDW: 11.9 % (ref 11.5–15.5)
WBC: 6.9 10*3/uL (ref 4.0–10.5)

## 2017-02-01 LAB — BASIC METABOLIC PANEL
ANION GAP: 7 (ref 5–15)
BUN: 9 mg/dL (ref 6–20)
CHLORIDE: 107 mmol/L (ref 101–111)
CO2: 24 mmol/L (ref 22–32)
Calcium: 8.8 mg/dL — ABNORMAL LOW (ref 8.9–10.3)
Creatinine, Ser: 0.87 mg/dL (ref 0.61–1.24)
GFR calc non Af Amer: >60 mL/min (ref >60)
GLUCOSE: 99 mg/dL (ref 65–99)
POTASSIUM: 3.6 mmol/L (ref 3.5–5.1)
Sodium: 138 mmol/L (ref 135–145)

## 2017-02-01 LAB — KOH PREP

## 2017-02-01 SURGERY — ESOPHAGOGASTRODUODENOSCOPY (EGD) WITH PROPOFOL
Anesthesia: Monitor Anesthesia Care

## 2017-02-01 MED ORDER — SUCRALFATE 1 GM/10ML PO SUSP: 1.0000 g | Freq: Three times a day (TID) | ORAL | 0 refills | Status: DC

## 2017-02-01 MED ORDER — MIDAZOLAM HCL 2 MG/2ML IJ SOLN
2.0000 mg | Freq: Once | INTRAMUSCULAR | Status: AC
  Administered 2017-02-01: 2 mg via INTRAVENOUS

## 2017-02-01 MED ORDER — LIDOCAINE VISCOUS 2 % MT SOLN
15.0000 mL | Freq: Once | OROMUCOSAL | Status: AC
  Administered 2017-02-01: 5 mL via OROMUCOSAL

## 2017-02-01 MED ORDER — PROPOFOL 10 MG/ML IV BOLUS
INTRAVENOUS | Status: DC | PRN
  Administered 2017-02-01: 20 mg via INTRAVENOUS

## 2017-02-01 MED ORDER — LIDOCAINE VISCOUS 2 % MT SOLN
OROMUCOSAL | Status: AC
  Filled 2017-02-01: qty 15

## 2017-02-01 MED ORDER — FLUCONAZOLE 100 MG PO TABS: 100.0000 mg | ORAL_TABLET | Freq: Every day | ORAL | 0 refills | Status: AC

## 2017-02-01 MED ORDER — PROPOFOL 10 MG/ML IV BOLUS
INTRAVENOUS | Status: AC
  Filled 2017-02-01: qty 40

## 2017-02-01 MED ORDER — SODIUM CHLORIDE 0.9% FLUSH
INTRAVENOUS | Status: AC
  Filled 2017-02-01: qty 10

## 2017-02-01 MED ORDER — MIDAZOLAM HCL 2 MG/2ML IJ SOLN
INTRAMUSCULAR | Status: AC
  Filled 2017-02-01: qty 2

## 2017-02-01 MED ORDER — MINERAL OIL PO OIL
TOPICAL_OIL | ORAL | Status: AC
  Filled 2017-02-01: qty 60

## 2017-02-01 MED ORDER — PANTOPRAZOLE SODIUM 40 MG PO TBEC: 40.0000 mg | DELAYED_RELEASE_TABLET | Freq: Two times a day (BID) | ORAL | 1 refills | Status: DC

## 2017-02-01 MED ORDER — LIDOCAINE HCL (PF) 1 % IJ SOLN
INTRAMUSCULAR | Status: AC
  Filled 2017-02-01: qty 20

## 2017-02-01 MED ORDER — FENTANYL CITRATE (PF) 100 MCG/2ML IJ SOLN
INTRAMUSCULAR | Status: AC
  Filled 2017-02-01: qty 2

## 2017-02-01 MED ORDER — FENTANYL CITRATE (PF) 100 MCG/2ML IJ SOLN
25.0000 ug | Freq: Once | INTRAMUSCULAR | Status: AC
  Administered 2017-02-01: 25 ug via INTRAVENOUS

## 2017-02-01 MED ORDER — LACTATED RINGERS IV SOLN
INTRAVENOUS | Status: DC | PRN
  Administered 2017-02-01: 11:00:00 via INTRAVENOUS

## 2017-02-01 MED ORDER — PROPOFOL 500 MG/50ML IV EMUL
INTRAVENOUS | Status: DC | PRN
  Administered 2017-02-01: 150 ug/kg/min via INTRAVENOUS

## 2017-02-01 NOTE — Progress Notes (Signed)
KOH prep is positive for yeast. Discussed with Dr. Roderic Palau. I recommend a 3 week course of Diflucan.

## 2017-02-01 NOTE — NC FL2 (Signed)
[place] LEVEL OF CARE SCREENING TOOL     IDENTIFICATION  Patient Name: Danny Greene Birthdate: 01-13-1961 Sex: male Admission Date (Current Location): 01/28/2017  [facility] and [place] Number:  [employer] and Address:  [place] [address] [place]      Provider Number: [id number]  Attending Physician Name and Address:  Kathie Dike, MD  Relative Name and Phone Number:       Current Level of Care: Hospital Recommended Level of Care: [facility] Prior Approval Number:    Date Approved/Denied:   PASRR Number:    Discharge Plan: Family Care Home    Current Diagnoses: Patient Active Problem List   Diagnosis Date Noted  . Nausea & vomiting 01/28/2017  . Colon cancer screening 10/30/2016  . Hepatitis C antibody test positive 11/01/2015    Class: History of  . Mucosal abnormality of esophagus   . Dysphagia   . 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  . Intractable vomiting with nausea 06/09/2015  . Dehydration 06/09/2015  . Periumbilical abdominal pain   . Esophagitis 05/26/2015  . Schizophrenia, unspecified type ([place]) 05/26/2015  . Tobacco abuse 05/26/2015  . Abnormal CT scan, esophagus   . Hiatal hernia   . Reflux esophagitis   . Acute esophagitis   . Abdominal pain 05/25/2015  . Hypokalemia 05/25/2015    Orientation RESPIRATION BLADDER Height & Weight     Self, Place, Situation  Normal Continent Weight: 177 lb 0.5 oz (80.3 kg) Height:  5\' 11"  (180.3 cm)  BEHAVIORAL SYMPTOMS/MOOD NEUROLOGICAL BOWEL NUTRITION STATUS      Continent Diet Regular  AMBULATORY STATUS COMMUNICATION OF NEEDS Skin   Independent Verbally Normal                       Personal Care Assistance Level of Assistance  Bathing, Feeding, Dressing Bathing Assistance: Independent Feeding assistance: Independent Dressing Assistance: Independent     Functional Limitations Info  Sight, Hearing, Speech Sight Info:  Adequate Hearing Info: Adequate Speech Info: Adequate    SPECIAL CARE FACTORS FREQUENCY                       Contractures Contractures Info: Not present    Additional Factors Info  Code Status, Allergies Code Status Info: Full Code Allergies Info: NKA           Current Medications (02/01/2017):  This is the current hospital active medication list Current Facility-Administered Medications  Medication Dose Route Frequency Provider Last Rate Last Dose  . cholecalciferol (VITAMIN D) tablet 2,000 Units  2,000 Units Oral Daily Orvan Falconer, MD   2,000 Units at 01/31/17 1106  . cloZAPine (CLOZARIL) tablet 200 mg  200 mg Oral QHS Orvan Falconer, MD   200 mg at 01/31/17 2110  . dextrose 5 %-0.9 % sodium chloride infusion   Intravenous Continuous Tawni Millers, MD 75 mL/hr at 01/31/17 1117    . fluvoxaMINE (LUVOX) tablet 100 mg  100 mg Oral QHS Orvan Falconer, MD   100 mg at 01/31/17 2110  . gemfibrozil (LOPID) tablet 600 mg  600 mg Oral BID AC Orvan Falconer, MD   600 mg at 01/31/17 1731  . ondansetron (ZOFRAN) injection 4 mg  4 mg Intravenous Q8H Gill, Eric A, NP   4 mg at 02/01/17 1400  . pantoprazole (PROTONIX) injection 40 mg  40 mg Intravenous Q12H Arrien, Jimmy Picket, MD   40 mg  at 01/31/17 2109  . promethazine (PHENERGAN) injection 6.25 mg  6.25 mg Intravenous Q6H PRN Arrien, Jimmy Picket, MD   6.25 mg at 01/31/17 0835  . senna (SENOKOT) tablet 17.2 mg  2 tablet Oral QHS Orvan Falconer, MD   17.2 mg at 01/31/17 2110  . sucralfate (CARAFATE) 1 GM/10ML suspension 1 g  1 g Oral TID WC & HS Arrien, Jimmy Picket, MD   1 g at 01/30/17 2057     Discharge Medications: Please see discharge summary for a list of discharge medications.  STOP taking these medications   lisinopril 10 MG tablet Commonly known as:  PRINIVIL,ZESTRIL     TAKE these medications   clozapine 200 MG tablet Commonly known as:  CLOZARIL Take 200 mg by mouth at bedtime.   fluconazole 100 MG  tablet Commonly known as:  DIFLUCAN Take 1 tablet (100 mg total) by mouth daily.   fluvoxaMINE 100 MG tablet Commonly known as:  LUVOX Take 100 mg by mouth at bedtime. sleep   gemfibrozil 600 MG tablet Commonly known as:  LOPID Take 600 mg by mouth 2 (two) times daily before a meal.   ondansetron 4 MG disintegrating tablet Commonly known as:  ZOFRAN-ODT Take 4 mg by mouth every 8 (eight) hours as needed for nausea or vomiting.   pantoprazole 40 MG tablet Commonly known as:  PROTONIX Take 1 tablet (40 mg total) by mouth 2 (two) times daily before a meal. What changed:  when to take this   senna 8.6 MG Tabs tablet Commonly known as:  SENOKOT Take 2 tablets by mouth at bedtime.   sucralfate 1 GM/10ML suspension Commonly known as:  CARAFATE Take 10 mLs (1 g total) by mouth 4 (four) times daily -  with meals and at bedtime.   Vitamin D 2000 units Caps Take 1 capsule by mouth daily.     Relevant Imaging Results:  Relevant Lab Results:   Additional Information    [place]illy [place], LCSW

## 2017-02-01 NOTE — Discharge Summary (Signed)
Physician Discharge Summary  Danny Greene PPI:[id number] DOB: [birth year] DOA: 01/28/2017  PCP: Lauretta Grill, NP  Admit date: 01/28/2017 Discharge date: 02/01/2017  Admitted From: Group home Disposition:  Group home  Recommendations for Outpatient Follow-up:  1. Follow up with PCP in 1-2 weeks 2. Please obtain BMP/CBC in one week 3. Follow-up gastroenterology in one month  Discharge Condition:Stable CODE STATUS:Full code Diet recommendation: Heart Healthy  Brief/Interim Summary: 56 year old male with a history of schizophrenia, ulcerative esophagitis, presented to the hospital with intractable nausea and vomiting, coffee-ground emesis. He was initially treated with proton pump inhibitors, antiemetics and diet was slowly advanced. Vomiting persisted and GI was consulted. He underwent EGD that confirmed significant esophagitis. Recommendations were for proton pump inhibitors twice a day as well as Carafate. He will need follow-up with GI in the next month and will likely need repeat EGD to ensure resolution of esophagitis. KOH stain also positive from EGD which indicated a fungal element. He will be treated with 21 days of diflucan. It is felt that his esophagitis is likely the source of any GI bleeding. He does not appear to have any active bleeding at this time. He will also noted to be significantly dehydrated and had acute kidney injury on admission. This has corrected with IV fluids. Remainder of his medical problems remain stable. He is now tolerating a solid diet and can be discharged.  Discharge Diagnoses:  Active Problems:   Hypokalemia   Hiatal hernia   Esophagitis   Schizophrenia, unspecified type (HCC)   Dehydration   Ulcerative esophagitis   Nausea & vomiting   Candida esophagitis (HCC)   AKI (acute kidney injury) [facility])    Discharge Instructions  Discharge Instructions    Diet - low sodium heart healthy    Complete by:  As directed    Increase activity slowly     Complete by:  As directed      Allergies as of 02/01/2017   No Known Allergies     Medication List    STOP taking these medications   lisinopril 10 MG tablet Commonly known as:  PRINIVIL,ZESTRIL     TAKE these medications   clozapine 200 MG tablet Commonly known as:  CLOZARIL Take 200 mg by mouth at bedtime.   fluconazole 100 MG tablet Commonly known as:  DIFLUCAN Take 1 tablet (100 mg total) by mouth daily.   fluvoxaMINE 100 MG tablet Commonly known as:  LUVOX Take 100 mg by mouth at bedtime. sleep   gemfibrozil 600 MG tablet Commonly known as:  LOPID Take 600 mg by mouth 2 (two) times daily before a meal.   ondansetron 4 MG disintegrating tablet Commonly known as:  ZOFRAN-ODT Take 4 mg by mouth every 8 (eight) hours as needed for nausea or vomiting.   pantoprazole 40 MG tablet Commonly known as:  PROTONIX Take 1 tablet (40 mg total) by mouth 2 (two) times daily before a meal. What changed:  when to take this   senna 8.6 MG Tabs tablet Commonly known as:  SENOKOT Take 2 tablets by mouth at bedtime.   sucralfate 1 GM/10ML suspension Commonly known as:  CARAFATE Take 10 mLs (1 g total) by mouth 4 (four) times daily -  with meals and at bedtime.   Vitamin D 2000 units Caps Take 1 capsule by mouth daily.       No Known Allergies  Consultations:  Gastroenterology   Procedures/Studies: Ct Abdomen Pelvis Wo Contrast  Result Date: 01/28/2017 CLINICAL DATA:  Abdominal pain and vomiting since 01/27/2017. EXAM: CT ABDOMEN AND PELVIS WITHOUT CONTRAST TECHNIQUE: Multidetector CT imaging of the abdomen and pelvis was performed following the standard protocol without IV contrast. COMPARISON:  05/25/2015 CT FINDINGS: Lower chest: Moderate-sized hiatal hernia with mild transmural thickening of the visualized distal esophagus, question esophagitis. Dependent atelectasis at the lung bases. Normal visualized cardiac chambers. No effusion or pneumothorax. Hepatobiliary: The  liver is diffusely hypodense consistent with hepatic steatosis. The gallbladder is physiologically distended without calculus. Pancreas: Unremarkable. No pancreatic ductal dilatation or surrounding inflammatory changes. Spleen: Normal in size without focal abnormality. Adrenals/Urinary Tract: Adrenal glands are unremarkable. Kidneys are normal, without renal calculi, focal lesion, or hydronephrosis. Bladder is unremarkable. Stomach/Bowel: Stomach is within normal limits. Appendix appears normal. No evidence of bowel wall thickening, distention, or inflammatory changes. Diffuse colonic spasm. Diffuse mild submucosal fatty infiltration of the cecum through distal descending colon may reflect sequela of chronic inflammatory bowel. Vascular/Lymphatic: No significant vascular findings are present. No enlarged abdominal or pelvic lymph nodes. Reproductive: Prostate is unremarkable. Other: Tiny fat containing umbilical hernia. No abdominopelvic ascites. Musculoskeletal: Chronic L1 compression fracture. No acute nor suspicious osseous lesions. IMPRESSION: 1. Moderate-sized hiatal hernia is again noted with mild distal esophageal mucosal thickening question esophagitis. 2. No bowel obstruction. Diffuse colonic spasm. Fatty infiltration of the colon as above described may reflect sequela of chronic inflammatory bowel disease. 3. Hepatic steatosis. Electronically Signed   By: Ashley Royalty M.D.   On: 01/28/2017 21:31   Dg Chest 1 View  Result Date: 01/28/2017 CLINICAL DATA:  Shortness of breath. EXAM: CHEST 1 VIEW COMPARISON:  Radiographs of June 09, 2015. FINDINGS: The heart size and mediastinal contours are within normal limits. Both lungs are clear. No pneumothorax or pleural effusion is noted. Probable small hiatal hernia is noted. The visualized skeletal structures are unremarkable. IMPRESSION: Probable small hiatal hernia. No acute cardiopulmonary abnormality seen. Electronically Signed   By: Marijo Conception, M.D.    On: 01/28/2017 19:50   EGD:KOH prep performed. I suspect patient bled from his                            esophagus.                           - LA Grade D esophagitis.                           - Medium-sized hiatal hernia.                           - Normal duodenal bulb and second portion of the                            duodenum.   Subjective: No further vomiting. Tolerated a solid diet.   Discharge Exam: Vitals:   02/01/17 1230 02/01/17 1400  BP: 107/77 112/79  Pulse: 84 75  Resp: (!) 23 18  Temp: 97.7 F (36.5 C) 98.1 F (36.7 C)   Vitals:   02/01/17 1150 02/01/17 1217 02/01/17 1230 02/01/17 1400  BP: 104/75 107/82 107/77 112/79  Pulse:  85 84 75  Resp: (!) 21 16 (!) 23 18  Temp:  97.6 F (36.4 C) 97.7 F (36.5 C) 98.1 F (36.7 C)  TempSrc:  Oral  SpO2: 97% 96% 94% 97%  Weight:      Height:        General: Pt is alert, awake, not in acute distress Cardiovascular: RRR, S1/S2 +, no rubs, no gallops Respiratory: CTA bilaterally, no wheezing, no rhonchi Abdominal: Soft, NT, ND, bowel sounds + Extremities: no edema, no cyanosis    The results of significant diagnostics from this hospitalization (including imaging, microbiology, ancillary and laboratory) are listed below for reference.     Microbiology: Recent Results (from the past 240 hour(s))  KOH prep     Status: None   Collection Time: 02/01/17 12:04 PM  Result Value Ref Range Status   Specimen Description ESOPHAGUS  Final   Special Requests NONE  Final   KOH Prep FEW YEAST  Final   Report Status 02/01/2017 FINAL  Final     Labs: BNP (last 3 results) No results for input(s): BNP in the last 8760 hours. Basic Metabolic Panel:  Recent Labs Lab 01/28/17 1934 01/29/17 0538 01/30/17 0602 01/31/17 0442 02/01/17 0442  NA 141 138 138 138 138  K 4.2 2.9* 3.2* 3.2* 3.6  CL 88* 95* 101 106 107  CO2 35* 33* 28 27 24   GLUCOSE 169* 136* 119* 110* 99  BUN 32* 24* 14 10 9   CREATININE 1.93* 1.12  0.94 0.76 0.87  CALCIUM 10.4* 8.9 8.6* 8.5* 8.8*   Liver Function Tests:  Recent Labs Lab 01/28/17 1934  AST 64*  ALT 57  ALKPHOS 102  BILITOT 0.6  PROT 8.4*  ALBUMIN 4.6    Recent Labs Lab 01/28/17 1934  LIPASE 25    Recent Labs Lab 01/28/17 1934  AMMONIA 14   CBC:  Recent Labs Lab 01/28/17 1934 01/29/17 0538 01/30/17 0602 01/31/17 0442 02/01/17 0442  WBC 21.1* 22.2* 11.0* 6.5 6.9  NEUTROABS 18.0*  --  8.1*  --   --   HGB 14.6 12.3* 11.0* 10.4* 11.3*  HCT 43.5 37.5* 31.8* 30.2* 32.8*  MCV 99.3 100.5* 99.1 97.1 96.2  PLT 330 278 242 227 239   Cardiac Enzymes: No results for input(s): CKTOTAL, CKMB, CKMBINDEX, TROPONINI in the last 168 hours. BNP: Invalid input(s): POCBNP CBG: No results for input(s): GLUCAP in the last 168 hours. D-Dimer No results for input(s): DDIMER in the last 72 hours. Hgb A1c No results for input(s): HGBA1C in the last 72 hours. Lipid Profile No results for input(s): CHOL, HDL, LDLCALC, TRIG, CHOLHDL, LDLDIRECT in the last 72 hours. Thyroid function studies No results for input(s): TSH, T4TOTAL, T3FREE, THYROIDAB in the last 72 hours.  Invalid input(s): FREET3 Anemia work up No results for input(s): VITAMINB12, FOLATE, FERRITIN, TIBC, IRON, RETICCTPCT in the last 72 hours. Urinalysis    Component Value Date/Time   COLORURINE YELLOW 01/28/2017 2250   APPEARANCEUR HAZY (A) 01/28/2017 2250   APPEARANCEUR Clear 12/24/2014 1603   LABSPEC 1.021 01/28/2017 2250   LABSPEC 1.011 12/24/2014 1603   PHURINE 6.0 01/28/2017 2250   GLUCOSEU NEGATIVE 01/28/2017 2250   GLUCOSEU Negative 12/24/2014 1603   HGBUR SMALL (A) 01/28/2017 2250   BILIRUBINUR NEGATIVE 01/28/2017 2250   BILIRUBINUR Negative 12/24/2014 1603   KETONESUR NEGATIVE 01/28/2017 2250   PROTEINUR 100 (A) 01/28/2017 2250   UROBILINOGEN 0.2 06/09/2015 1540   NITRITE NEGATIVE 01/28/2017 2250   LEUKOCYTESUR NEGATIVE 01/28/2017 2250   LEUKOCYTESUR Negative 12/24/2014 1603    Sepsis Labs Invalid input(s): PROCALCITONIN,  WBC,  LACTICIDVEN Microbiology Recent Results (from the past 240 hour(s))  KOH prep  Status: None   Collection Time: 02/01/17 12:04 PM  Result Value Ref Range Status   Specimen Description ESOPHAGUS  Final   Special Requests NONE  Final   KOH Prep FEW YEAST  Final   Report Status 02/01/2017 FINAL  Final     Time coordinating discharge: Over 30 minutes  SIGNED:   Kathie Dike, MD  [facility] 02/01/2017, 3:41 PM Pager   If 7PM-7AM, please contact night-coverage [email or web address] Password [email or web address]

## 2017-02-01 NOTE — Progress Notes (Signed)
Patient seen and examined in short stay. Denies dysphagia. Diagnostic EGD per plan.  The risks, benefits, limitations, alternatives and imponderables have been reviewed with the patient. Potential for esophageal dilation, biopsy, etc. have also been reviewed.  Questions have been answered. All parties agreeable.

## 2017-02-01 NOTE — Op Note (Addendum)
[facility] Patient Name: Danny Greene Procedure Date: 02/01/2017 11:56 AM MRN: [id number] Date of Birth: 1961/07/11 Attending MD: Norvel Richards , MD CSN: [id number] Age: 56 Admit Type: Inpatient Procedure:                Upper GI endoscopy Indications:              Nausea with vomiting Providers:                Norvel Richards, MD, Lurline Del, RN, Randa Spike, Technician Referring MD:              Medicines:                Propofol per Anesthesia Complications:            No immediate complications. Estimated Blood Loss:     Estimated blood loss was minimal. Procedure:                Pre-Anesthesia Assessment:                           - Prior to the procedure, a History and Physical                            was performed, and patient medications and                            allergies were reviewed. The patient's tolerance of                            previous anesthesia was also reviewed. The risks                            and benefits of the procedure and the sedation                            options and risks were discussed with the patient.                            All questions were answered, and informed consent                            was obtained. Prior Anticoagulants: The patient has                            taken no previous anticoagulant or antiplatelet                            agents. ASA Grade Assessment: III - A patient with                            severe systemic disease. After reviewing the risks  and benefits, the patient was deemed in                            satisfactory condition to undergo the procedure.                           After obtaining informed consent, the endoscope was                            passed under direct vision. Throughout the                            procedure, the patient's blood pressure, pulse, and                            oxygen  saturations were monitored continuously.The                            upper GI endoscopy was accomplished without                            difficulty. The patient tolerated the procedure                            well. The EG-[id number] ([id number]) scope was introduced                            through the and advanced to the second part of                            duodenum. Scope In: 12:00:24 PM Scope Out: 12:05:38 PM Total Procedure Duration: 0 hours 5 minutes 14 seconds  Findings:      LA Grade D (one or more mucosal breaks involving at least 75% of       esophageal circumference) esophagitis was found 25 to 38 cm from the       incisors. extensive erosive ulcerative esophagitis present with       overlying ribbonlike plaques. Marked friability of esophageal mucosa.       esophageal mucosa brush for KOH prep      A medium-sized hiatal hernia was present.      The cardia and gastric fundus were normal on retroflexion.      The duodenal bulb and second portion of the duodenum were normal. Impression:               KOH prep performed. I suspect patient bled from his                            esophagus.                           - LA Grade D esophagitis.                           - Medium-sized hiatal hernia.                           -  Normal duodenal bulb and second portion of the                            duodenum.                           - No specimens collected. Moderate Sedation:      Moderate (conscious) sedation was personally administered by an       anesthesia professional. The following parameters were monitored: oxygen       saturation, heart rate, blood pressure, respiratory rate, EKG, adequacy       of pulmonary ventilation, and response to care. Total physician       intraservice time was 13 minutes. Recommendation:           - Return patient to hospital ward for ongoing care.                           - Advance diet as tolerated.                           -  Continue present medications. Continue Protonix                            40 mg twice daily. Carafate 1 g 4 times a day -7                            days                           - Await KOH results.                           - Repeat upper endoscopy in 3 months for                            surveillance. Will offer colonoscopy at that time                            as well.                           - Return to GI clinic in 1 month. If he does well,                            patient can probably be discharged later today as                            discussed with Dr. Roderic Palau Procedure Code(s):        --- Professional ---                           [phone], Esophagogastroduodenoscopy, flexible,                            transoral; diagnostic, including collection of  specimen(s) by brushing or washing, when performed                            (separate procedure) Diagnosis Code(s):        --- Professional ---                           K20.9, Esophagitis, unspecified                           K44.9, Diaphragmatic hernia without obstruction or                            gangrene                           R11.2, Nausea with vomiting, unspecified CPT copyright 2016 American Medical Association. All rights reserved. The codes documented in this report are preliminary and upon coder review may  be revised to meet current compliance requirements. [name]. Jina Olenick, MD Norvel Richards, MD 02/01/2017 61:84:85 PM This report has been signed electronically. Number of Addenda: 0

## 2017-02-01 NOTE — Anesthesia Postprocedure Evaluation (Signed)
Anesthesia Post Note  Patient: Danny Greene  Procedure(s) Performed: Procedure(s) (LRB): ESOPHAGOGASTRODUODENOSCOPY (EGD) WITH PROPOFOL (N/A)  Patient location during evaluation: PACU Anesthesia Type: MAC Level of consciousness: awake and alert and oriented Pain management: pain level controlled Vital Signs Assessment: post-procedure vital signs reviewed and stable Respiratory status: spontaneous breathing Cardiovascular status: blood pressure returned to baseline Postop Assessment: no signs of nausea or vomiting Anesthetic complications: no     Last Vitals:  Vitals:   02/01/17 1150 02/01/17 1217  BP: 104/75   Pulse:    Resp: (!) 21   Temp:  36.4 C    Last Pain:  Vitals:   02/01/17 1217  TempSrc:   PainSc: 0-No pain                 Kataryna Mcquilkin

## 2017-02-01 NOTE — Transfer of Care (Signed)
Immediate Anesthesia Transfer of Care Note  Patient: Danny Greene  Procedure(s) Performed: Procedure(s): ESOPHAGOGASTRODUODENOSCOPY (EGD) WITH PROPOFOL (N/A)  Patient Location: PACU  Anesthesia Type:MAC  Level of Consciousness: awake  Airway & Oxygen Therapy: Patient Spontanous Breathing and Patient connected to nasal cannula oxygen  Post-op Assessment: Report given to RN  Post vital signs: Reviewed  Last Vitals:  Vitals:   02/01/17 1145 02/01/17 1150  BP:  104/75  Pulse:    Resp: (!) 21 (!) 21  Temp:      Last Pain:  Vitals:   02/01/17 1055  TempSrc: Oral  PainSc:       Patients Stated Pain Goal: 8 (70/26/37 8588)  Complications: No apparent anesthesia complications

## 2017-02-01 NOTE — Progress Notes (Addendum)
LCSW made aware patient medically stable to return to group home this afternoon. Call placed to [facility] manager in effort to facilitate transport)  Message left.  Message returned. [name] reports he will be here to pick patient up at 7:30pm this evening.    Awaiting DC summary and will send all clinicals to facility.  Completed.  All information sent via [place].  Packet given to facility. Will follow up regarding transportation and return home.  Completed. Group home coming to get patient. Patient has no family listed and facility reports they are his family.   No other needs at this time.  DC back to [facility] home.  Lane Hacker, MSW Clinical Social Work: [employer] Coverage for :  [phone]

## 2017-02-01 NOTE — Anesthesia Preprocedure Evaluation (Signed)
Anesthesia Evaluation  Patient identified by MRN, date of birth, ID band Patient awake    Reviewed: Allergy & Precautions, NPO status , Patient's Chart, lab work & pertinent test results, reviewed documented beta blocker date and time   Airway Mallampati: II  TM Distance: >3 FB     Dental  (+) Poor Dentition   Pulmonary Current Smoker,    Pulmonary exam normal        Cardiovascular hypertension, Normal cardiovascular exam Rhythm:Regular     Neuro/Psych    GI/Hepatic hiatal hernia, PUD, GERD  Medicated,(+) Hepatitis -, CAntibody   Endo/Other    Renal/GU      Musculoskeletal   Abdominal Normal abdominal exam  (+)   Peds  Hematology   Anesthesia Other Findings   Reproductive/Obstetrics                             Anesthesia Physical Anesthesia Plan  ASA: IV  Anesthesia Plan: MAC   Post-op Pain Management:    Induction: Intravenous  Airway Management Planned: Nasal Cannula  Additional Equipment:   Intra-op Plan:   Post-operative Plan:   Informed Consent: I have reviewed the patients History and Physical, chart, labs and discussed the procedure including the risks, benefits and alternatives for the proposed anesthesia with the patient or authorized representative who has indicated his/her understanding and acceptance.   Dental advisory given  Plan Discussed with: CRNA  Anesthesia Plan Comments:         Anesthesia Quick Evaluation

## 2017-02-02 NOTE — Addendum Note (Signed)
Addendum  created 02/02/17 0954 by Charmaine Downs, CRNA   Sign clinical note

## 2017-02-02 NOTE — Anesthesia Postprocedure Evaluation (Signed)
Anesthesia Post Note  Patient: Brianna Esson  Procedure(s) Performed: Procedure(s) (LRB): ESOPHAGOGASTRODUODENOSCOPY (EGD) WITH PROPOFOL (N/A)  Patient location during evaluation: Nursing Unit Anesthesia Type: MAC Level of consciousness: awake and patient cooperative Pain management: pain level controlled Vital Signs Assessment: post-procedure vital signs reviewed and stable Respiratory status: spontaneous breathing, nonlabored ventilation and respiratory function stable Cardiovascular status: blood pressure returned to baseline Postop Assessment: no signs of nausea or vomiting Anesthetic complications: no     Last Vitals:  Vitals:   02/01/17 1230 02/01/17 1400  BP: 107/77 112/79  Pulse: 84 75  Resp: (!) 23 18  Temp: 36.5 C 36.7 C    Last Pain:  Vitals:   02/01/17 1400  TempSrc: Oral  PainSc:                  Nyelle Wolfson J

## 2017-02-07 ENCOUNTER — Encounter (HOSPITAL_COMMUNITY): Payer: Self-pay | Admitting: Internal Medicine

## 2017-03-14 ENCOUNTER — Telehealth: Payer: Self-pay

## 2017-03-14 LAB — COLOGUARD: COLOGUARD: POSITIVE

## 2017-03-14 NOTE — Telephone Encounter (Signed)
Patient's Cologuard was positive.   This could indicate presence of colorectal cancer or advance precancerous polyp.   Patient really needs to have a colonoscopy. Please arrange for ov to discuss within the next 2 weeks. May use urgent.

## 2017-03-14 NOTE — Telephone Encounter (Signed)
Please schedule urgent ov to discuss results.

## 2017-03-14 NOTE — Telephone Encounter (Signed)
cologuard results are here. I have put them in LSL cart.

## 2017-03-15 ENCOUNTER — Encounter: Payer: Self-pay | Admitting: Internal Medicine

## 2017-03-15 NOTE — Telephone Encounter (Signed)
OV made and letter mailed °

## 2017-03-21 ENCOUNTER — Encounter: Payer: Self-pay | Admitting: Gastroenterology

## 2017-03-21 ENCOUNTER — Other Ambulatory Visit: Payer: Self-pay

## 2017-03-21 ENCOUNTER — Ambulatory Visit (INDEPENDENT_AMBULATORY_CARE_PROVIDER_SITE_OTHER): Payer: Medicaid Other | Admitting: Gastroenterology

## 2017-03-21 VITALS — BP 108/79 | HR 122 | Temp 96.7°F | Ht 71.0 in | Wt 172.2 lb

## 2017-03-21 DIAGNOSIS — R768 Other specified abnormal immunological findings in serum: Secondary | ICD-10-CM | POA: Diagnosis not present

## 2017-03-21 DIAGNOSIS — D649 Anemia, unspecified: Secondary | ICD-10-CM

## 2017-03-21 DIAGNOSIS — R195 Other fecal abnormalities: Secondary | ICD-10-CM

## 2017-03-21 DIAGNOSIS — K2100 Gastro-esophageal reflux disease with esophagitis, without bleeding: Secondary | ICD-10-CM

## 2017-03-21 MED ORDER — PANTOPRAZOLE SODIUM 40 MG PO TBEC: 40.0000 mg | DELAYED_RELEASE_TABLET | Freq: Two times a day (BID) | ORAL | 5 refills | Status: DC

## 2017-03-21 NOTE — Progress Notes (Signed)
cc'ed to pcp °

## 2017-03-21 NOTE — Assessment & Plan Note (Signed)
Increase to twice a day dosing given last EGD findings. It is unclear how dosing had been changed, discharge summary was for twice a day. Repeat EGD at time of colonoscopy in 3-4 weeks as planned. Deep sedation in the OR.  I have discussed the risks, alternatives, benefits with regards to but not limited to the risk of reaction to medication, bleeding, infection, perforation and the patient is agreeable to proceed. Written consent to be obtained.  Few yeast on previous KOH. He received Diflucan at time of discharge.

## 2017-03-21 NOTE — Progress Notes (Signed)
Primary Care Physician:  Lauretta Grill, NP  Primary Gastroenterologist:  Garfield Cornea, MD   Chief Complaint  Patient presents with  . Colonoscopy    to discuss Cologard results    HPI:  Danny Greene is a 56 y.o. male here to discuss positive Cologuard results. He has a history of severe reflux esophagitis on EGD in September 2016. Follow-up EGD January 2017 with possible short segment Barrett's, previous severe esophagitis had resolved. Biopsies did not confirm Barrett's. Last EGD during hospitalization back in May/June for vomiting dark emesis. LA grade D esophagitis 25-38 cm from the incisors, overlying ribbonlike plaques, marked friability of the esophageal mucosa. KOH prep with few yeast. Medium-sized hiatal hernia. Advised twice a day PPI and follow-up EGD in 3 months. Previously declined colonoscopies. History of positive HCV antibody but negative HCV RNA qualitative test 2017. When I saw him last he was feeling well. He gained 20 pounds. Again refused colonoscopy but wanted to complete the Cologuard test.   Patient was seen in the hospital back in May with nausea, vomiting, diarrhea. Dark emesis noted in emergency room.  EGD as above. CT and pelvis without contrast showed moderate sized hiatal hernia with? Esophagitis. Fatty liver. Diffuse mild submucosal fatty infiltration of the cecum through the distal descending colon may reflect sequela of chronic inflammatory bowel.  Clinically patient states he does fine. He has very little heartburn. Denies dysphagia. No abdominal pain. No bowel issues, melena, rectal bleeding. Appetite is good. Weight is stable.  He is due for repeat HCV RNA Quant, follow-up anemia.  Current Outpatient Prescriptions  Medication Sig Dispense Refill  . Cholecalciferol (VITAMIN D) 2000 units CAPS Take 1 capsule by mouth daily.    . clozapine (CLOZARIL) 200 MG tablet Take 200 mg by mouth at bedtime.     . fluvoxaMINE (LUVOX) 100 MG tablet Take 100 mg by  mouth at bedtime. sleep    . gemfibrozil (LOPID) 600 MG tablet Take 600 mg by mouth 2 (two) times daily before a meal.    . ondansetron (ZOFRAN-ODT) 4 MG disintegrating tablet Take 4 mg by mouth every 8 (eight) hours as needed for nausea or vomiting.    Marland Kitchen OVER THE COUNTER MEDICATION Earwax removal drops 10 drops into affected ear 2 times a day until gone    . pantoprazole (PROTONIX) 40 MG tablet Take 1 tablet (40 mg total) by mouth 2 (two) times daily before a meal. (Patient taking differently: Take 40 mg by mouth daily. ) 60 tablet 1  . senna (SENOKOT) 8.6 MG TABS tablet Take 2 tablets by mouth at bedtime.      No current facility-administered medications for this visit.     Allergies as of 03/21/2017  . (No Known Allergies)    Past Medical History:  Diagnosis Date  . Constipation   . GERD (gastroesophageal reflux disease)   . Hepatitis C antibody test positive    HCV RNA negative 11/2015  . Hypertension   . Schizophrenia [facility])     Past Surgical History:  Procedure Laterality Date  . CHOLECYSTECTOMY    . ESOPHAGEAL DILATION N/A 05/26/2015   Procedure: ESOPHAGEAL DILATION;  Surgeon: Daneil Dolin, MD;  [facility];  Service: Endoscopy;  Laterality: N/A;  . ESOPHAGEAL DILATION  09/21/2015   Procedure: ESOPHAGEAL DILATION;  Surgeon: Daneil Dolin, MD;  [facility];  Service: Endoscopy;;  . ESOPHAGOGASTRODUODENOSCOPY N/A 05/26/2015   RMR: Severe ulcerative esophagitits ad described. Status post biopsy. Hiatal hernia.   Marland Kitchen  ESOPHAGOGASTRODUODENOSCOPY N/A 09/21/2015   RMR: Abnormal distal esophagus query short segment Barretts status post biopsy. Severe esophagitis seen previously has resolved. Hiatal hernia. Status post maloney dilation prior to biopsy. No barrett's  . ESOPHAGOGASTRODUODENOSCOPY (EGD) WITH PROPOFOL N/A 02/01/2017   Procedure: ESOPHAGOGASTRODUODENOSCOPY (EGD) WITH PROPOFOL;  Surgeon: Daneil Dolin, MD;  [facility];  Service: Endoscopy;   Laterality: N/A;  . KIDNEY STONE SURGERY      Family History  Problem Relation Age of Onset  . Liver disease Neg Hx   . Colon cancer Neg Hx     Social History   Social History  . Marital status: Single    Spouse name: N/A  . Number of children: N/A  . Years of education: N/A   Occupational History  . Not on file.   Social History Main Topics  . Smoking status: Current Every Day Smoker    Packs/day: 1.00    Years: 1.00    Types: Cigarettes  . Smokeless tobacco: Never Used  . Alcohol use No     Comment: states "liquor every chance I can"; denied 03/21/17  . Drug use: No  . Sexual activity: Not Currently   Other Topics Concern  . Not on file   Social History Narrative  . No narrative on file      ROS:  General: Negative for anorexia, weight loss, fever, chills, fatigue, weakness. Eyes: Negative for vision changes.  ENT: Negative for hoarseness, difficulty swallowing , nasal congestion. CV: Negative for chest pain, angina, palpitations, dyspnea on exertion, peripheral edema.  Respiratory: Negative for dyspnea at rest, dyspnea on exertion, cough, sputum, wheezing.  GI: See history of present illness. GU:  Negative for dysuria, hematuria, urinary incontinence, urinary frequency, nocturnal urination.  MS: Negative for joint pain, low back pain.  Derm: Negative for rash or itching.  Neuro: Negative for weakness, abnormal sensation, seizure, frequent headaches, memory loss, confusion.  Psych: Negative for anxiety, depression, suicidal ideation, hallucinations.  Endo: Negative for unusual weight change.  Heme: Negative for bruising or bleeding. Allergy: Negative for rash or hives.    Physical Examination:  BP 108/79   Pulse (!) 122   Temp (!) 96.7 F (35.9 C) (Oral)   Ht 5\' 11"  (1.803 m)   Wt 172 lb 3.2 oz (78.1 kg)   BMI 24.02 kg/m    General: Well-nourished, well-developed in no acute distress.  Head: Normocephalic, atraumatic.   Eyes: Conjunctiva pink,  no icterus. Mouth: Oropharyngeal mucosa moist and pink , no lesions erythema or exudate. Neck: Supple without thyromegaly, masses, or lymphadenopathy.  Lungs: Clear to auscultation bilaterally.  Heart: Regular rate and rhythm, no murmurs rubs or gallops.  Abdomen: Bowel sounds are normal, nontender, nondistended, no hepatosplenomegaly or masses, no abdominal bruits or    hernia , no rebound or guarding.   Rectal: Not performed Extremities: No lower extremity edema. No clubbing or deformities.  Neuro: Alert and oriented x 4 , grossly normal neurologically.  Skin: Warm and dry, no rash or jaundice.   Psych: Alert and cooperative, normal mood and affect.  Labs: Lab Results  Component Value Date   CREATININE 0.87 02/01/2017   BUN 9 02/01/2017   NA 138 02/01/2017   K 3.6 02/01/2017   CL 107 02/01/2017   CO2 24 02/01/2017   Lab Results  Component Value Date   ALT 57 01/28/2017   AST 64 (H) 01/28/2017   ALKPHOS 102 01/28/2017   BILITOT 0.6 01/28/2017   Lab Results  Component Value Date   WBC 6.9 02/01/2017   HGB 11.3 (L) 02/01/2017   HCT 32.8 (L) 02/01/2017   MCV 96.2 02/01/2017   PLT 239 02/01/2017   No results found for: IRON, TIBC, FERRITIN  Lab Results  Component Value Date   INR 1.11 01/28/2017     Imaging Studies: No results found.

## 2017-03-21 NOTE — Assessment & Plan Note (Signed)
56 year old gentleman with no prior colonoscopy presenting to follow-up on Cologuard test. Test was positive. He has had some mild anemia last month during hospitalization. CT scan also with abnormalities noted in the proximal colon as outlined above. Reglan colonoscopy at this time. Plan for deep sedation in the OR. We will follow-up anemia with CBC at this time.  I have discussed the risks, alternatives, benefits with regards to but not limited to the risk of reaction to medication, bleeding, infection, perforation and the patient is agreeable to proceed. Written consent to be obtained.

## 2017-03-21 NOTE — Assessment & Plan Note (Signed)
HCV RNA quantitative test. Repeat LFTs. Per recommendations, given possibility of low level RNA which can intermittently be undetectable we'll plan a repeat again.

## 2017-03-21 NOTE — Patient Instructions (Addendum)
1. Colonoscopy and upper endoscopy as scheduled. See separate instructions.  2. Labs including CBC, HCV RNA, LFTs.  3. Take protonix 40mg  twice daily before breakfast ane evening meal. RX sent to [facility].

## 2017-04-12 ENCOUNTER — Encounter: Payer: Self-pay | Admitting: Emergency Medicine

## 2017-04-12 ENCOUNTER — Emergency Department
Admission: EM | Admit: 2017-04-12 | Discharge: 2017-04-12 | Disposition: A | Discharge status: Home / Self Care | Payer: Medicaid Other | Attending: Emergency Medicine | Admitting: Emergency Medicine

## 2017-04-12 DIAGNOSIS — Z79899 Other long term (current) drug therapy: Secondary | ICD-10-CM | POA: Diagnosis not present

## 2017-04-12 DIAGNOSIS — R112 Nausea with vomiting, unspecified: Secondary | ICD-10-CM

## 2017-04-12 DIAGNOSIS — F17210 Nicotine dependence, cigarettes, uncomplicated: Secondary | ICD-10-CM | POA: Diagnosis not present

## 2017-04-12 DIAGNOSIS — I10 Essential (primary) hypertension: Secondary | ICD-10-CM | POA: Insufficient documentation

## 2017-04-12 DIAGNOSIS — K210 Gastro-esophageal reflux disease with esophagitis: Secondary | ICD-10-CM | POA: Insufficient documentation

## 2017-04-12 DIAGNOSIS — E876 Hypokalemia: Secondary | ICD-10-CM | POA: Insufficient documentation

## 2017-04-12 DIAGNOSIS — R197 Diarrhea, unspecified: Secondary | ICD-10-CM | POA: Insufficient documentation

## 2017-04-12 LAB — URINALYSIS, COMPLETE (UACMP) WITH MICROSCOPIC
BACTERIA UA: NONE SEEN
Bilirubin Urine: NEGATIVE
GLUCOSE, UA: NEGATIVE mg/dL
KETONES UR: NEGATIVE mg/dL
Nitrite: NEGATIVE
PROTEIN: 30 mg/dL — AB
Specific Gravity, Urine: 1.02 (ref 1.005–1.030)
pH: 5 (ref 5.0–8.0)

## 2017-04-12 LAB — CBC
HCT: 43.4 % (ref 40.0–52.0)
HEMOGLOBIN: 14.8 g/dL (ref 13.0–18.0)
MCH: 32.1 pg (ref 26.0–34.0)
MCHC: 34.2 g/dL (ref 32.0–36.0)
MCV: 93.8 fL (ref 80.0–100.0)
Platelets: 335 10*3/uL (ref 150–440)
RBC: 4.63 MIL/uL (ref 4.40–5.90)
RDW: 12.8 % (ref 11.5–14.5)
WBC: 12 10*3/uL — ABNORMAL HIGH (ref 3.8–10.6)

## 2017-04-12 LAB — COMPREHENSIVE METABOLIC PANEL
ALBUMIN: 4.1 g/dL (ref 3.5–5.0)
ALK PHOS: 116 U/L (ref 38–126)
ALT: 71 U/L — AB (ref 17–63)
ANION GAP: 14 (ref 5–15)
AST: 82 U/L — ABNORMAL HIGH (ref 15–41)
BUN: 28 mg/dL — ABNORMAL HIGH (ref 6–20)
CALCIUM: 9.7 mg/dL (ref 8.9–10.3)
CHLORIDE: 93 mmol/L — AB (ref 101–111)
CO2: 28 mmol/L (ref 22–32)
Creatinine, Ser: 1.02 mg/dL (ref 0.61–1.24)
GFR calc Af Amer: >60 mL/min (ref >60)
GFR calc non Af Amer: >60 mL/min (ref >60)
GLUCOSE: 129 mg/dL — AB (ref 65–99)
Potassium: 2.6 mmol/L — CL (ref 3.5–5.1)
SODIUM: 135 mmol/L (ref 135–145)
Total Bilirubin: 0.8 mg/dL (ref 0.3–1.2)
Total Protein: 8.1 g/dL (ref 6.5–8.1)

## 2017-04-12 LAB — LIPASE, BLOOD: LIPASE: 37 U/L (ref 11–51)

## 2017-04-12 LAB — TROPONIN I

## 2017-04-12 MED ORDER — METOCLOPRAMIDE HCL 5 MG/ML IJ SOLN
10.0000 mg | Freq: Once | INTRAMUSCULAR | Status: DC
  Filled 2017-04-12: qty 2

## 2017-04-12 MED ORDER — POTASSIUM CHLORIDE ER 10 MEQ PO TBCR: 40.0000 meq | EXTENDED_RELEASE_TABLET | Freq: Two times a day (BID) | ORAL | 0 refills | Status: DC

## 2017-04-12 MED ORDER — SODIUM CHLORIDE 0.9 % IV BOLUS (SEPSIS)
1000.0000 mL | Freq: Once | INTRAVENOUS | Status: AC
  Administered 2017-04-12: 1000 mL via INTRAVENOUS

## 2017-04-12 MED ORDER — POTASSIUM CHLORIDE CRYS ER 20 MEQ PO TBCR
40.0000 meq | EXTENDED_RELEASE_TABLET | Freq: Once | ORAL | Status: AC
  Administered 2017-04-12: 40 meq via ORAL
  Filled 2017-04-12: qty 2

## 2017-04-12 MED ORDER — ONDANSETRON HCL 4 MG/2ML IJ SOLN
4.0000 mg | Freq: Once | INTRAMUSCULAR | Status: AC
  Administered 2017-04-12: 4 mg via INTRAVENOUS
  Filled 2017-04-12: qty 2

## 2017-04-12 NOTE — ED Notes (Addendum)
Pt reports vomiting and diarrhea since last week. Pt states he is not in any pain just feels nauseated and sick on his stomach. Pt states he is also afraid of falling states he has fallen recently as well. Pt requesting to eat chicken noodle soup and have something to drink.

## 2017-04-12 NOTE — ED Notes (Signed)
Pt given cranberry juice and warm blanket 

## 2017-04-12 NOTE — ED Provider Notes (Addendum)
[facility] Emergency Department Provider Note  ____________________________________________   First MD Initiated Contact with Patient 04/12/17 1749     (approximate)  I have reviewed the triage vital signs and the nursing notes.   HISTORY  Chief Complaint Emesis   HPI Danny Greene is a 56 y.o. male with a history of GERD as well as hepatitis C and schizophrenia and a recent GI bleed this past May secondary to esophagitis who is presenting to the emergency department today with nausea vomiting and diarrhea over the past week. He says since Sunday he has had multiple episodes of vomiting per day as well as diarrhea. He denies vomiting today and says that he was able to eat a piece of pizza. Says that he had a small amount of diarrhea today. 2 episodes of yellow appearing stool. He is denying any pain. Denies any sick contacts. Denies any recent antibiotics. Does not report any recent travel.Denies any blood in his vomitus. Continues to take protonix as prescribed at his group home.   Past Medical History:  Diagnosis Date  . Constipation   . GERD (gastroesophageal reflux disease)   . Hepatitis C antibody test positive    HCV RNA negative 11/2015  . Hypertension   . Schizophrenia [facility])     Patient Active Problem List   Diagnosis Date Noted  . Positive colorectal cancer screening using Cologuard test 03/21/2017  . Candida esophagitis ([place]) 02/01/2017  . AKI (acute kidney injury) ([place]) 02/01/2017  . Nausea & vomiting 01/28/2017  . Colon cancer screening 10/30/2016  . Hepatitis C antibody test positive 11/01/2015    Class: History of  . Mucosal abnormality of esophagus   . Dysphagia   . 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  . Intractable vomiting with nausea 06/09/2015  . Dehydration 06/09/2015  . Periumbilical abdominal pain   . Esophagitis 05/26/2015  . Schizophrenia, unspecified type ([place]) 05/26/2015  . Tobacco abuse 05/26/2015  . Abnormal CT  scan, esophagus   . Hiatal hernia   . Reflux esophagitis   . Acute esophagitis   . Abdominal pain 05/25/2015  . Hypokalemia 05/25/2015    Past Surgical History:  Procedure Laterality Date  . CHOLECYSTECTOMY    . ESOPHAGEAL DILATION N/A 05/26/2015   Procedure: ESOPHAGEAL DILATION;  Surgeon: Daneil Dolin, MD;  [facility];  Service: Endoscopy;  Laterality: N/A;  . ESOPHAGEAL DILATION  09/21/2015   Procedure: ESOPHAGEAL DILATION;  Surgeon: Daneil Dolin, MD;  [facility];  Service: Endoscopy;;  . ESOPHAGOGASTRODUODENOSCOPY N/A 05/26/2015   RMR: Severe ulcerative esophagitits ad described. Status post biopsy. Hiatal hernia.   Marland Kitchen ESOPHAGOGASTRODUODENOSCOPY N/A 09/21/2015   RMR: Abnormal distal esophagus query short segment Barretts status post biopsy. Severe esophagitis seen previously has resolved. Hiatal hernia. Status post maloney dilation prior to biopsy. No barrett's  . ESOPHAGOGASTRODUODENOSCOPY (EGD) WITH PROPOFOL N/A 02/01/2017   Dr. Gala Romney: LA grade D esophagitis with overlying ribbonlike plaques, KOH few yeast. Marked friability of esophageal mucosa. Recommend EGD in 3 months, twice a day PPI.  Marland Kitchen KIDNEY STONE SURGERY      Prior to Admission medications   Medication Sig Start Date End Date Taking? Authorizing Provider  Cholecalciferol (VITAMIN D) 2000 units CAPS Take 1 capsule by mouth daily.   Yes [provider]  clozapine (CLOZARIL) 200 MG tablet Take 200 mg by mouth at bedtime.    Yes [provider]  fluvoxaMINE (LUVOX) 100 MG tablet Take 100 mg by mouth at bedtime.  sleep   Yes [provider]  gemfibrozil (LOPID) 600 MG tablet Take 600 mg by mouth 2 (two) times daily before a meal.   Yes [provider]  ondansetron (ZOFRAN-ODT) 4 MG disintegrating tablet Take 4 mg by mouth every 8 (eight) hours as needed for nausea or vomiting.   Yes [provider]  pantoprazole (PROTONIX) 40 MG tablet Take 1 tablet (40 mg  total) by mouth 2 (two) times daily before a meal. Patient taking differently: Take 40 mg by mouth daily.  03/21/17  Yes Mahala Menghini, PA-C  senna (SENOKOT) 8.6 MG TABS tablet Take 2 tablets by mouth at bedtime.    Yes [provider]    Allergies Patient has no known allergies.  Family History  Problem Relation Age of Onset  . Liver disease Neg Hx   . Colon cancer Neg Hx     Social History Social History  Substance Use Topics  . Smoking status: Current Every Day Smoker    Packs/day: 1.00    Years: 1.00    Types: Cigarettes  . Smokeless tobacco: Never Used  . Alcohol use No     Comment: states "liquor every chance I can"; denied 03/21/17    Review of Systems  Constitutional: No fever/chills Eyes: No visual changes. ENT: No sore throat. Cardiovascular: Denies chest pain. Respiratory: Denies shortness of breath. Gastrointestinal: No abdominal pain.  No constipation. Genitourinary: Negative for dysuria. Musculoskeletal: Negative for back pain. Skin: Negative for rash. Neurological: Negative for headaches, focal weakness or numbness.   ____________________________________________   PHYSICAL EXAM:  VITAL SIGNS: ED Triage Vitals  Enc Vitals Group     BP 04/12/17 1701 115/84     Pulse Rate 04/12/17 1701 (!) 123     Resp 04/12/17 1701 16     Temp 04/12/17 1701 97.9 F (36.6 C)     Temp Source 04/12/17 1701 Oral     SpO2 04/12/17 1701 95 %     Weight 04/12/17 1700 180 lb (81.6 kg)     Height 04/12/17 1700 5\' 11"  (1.803 m)     Head Circumference --      Peak Flow --      Pain Score --      Pain Loc --      Pain Edu? --      Excl. in [place]? --     Constitutional: Alert and oriented. Well appearing and in no acute distress. Eyes: Conjunctivae are normal.  Head: Atraumatic. Nose: No congestion/rhinnorhea. Mouth/Throat: Mucous membranes are moist.  Neck: No stridor.   Cardiovascular: Normal rate, regular rhythm. Grossly normal heart  sounds. Respiratory: Normal respiratory effort.  No retractions. Lungs CTAB. Gastrointestinal: Soft and nontender. No distention.  Musculoskeletal: No lower extremity tenderness nor edema.  No joint effusions. Neurologic:  Normal speech and language. No gross focal neurologic deficits are appreciated. Skin:  Skin is warm, dry and intact. No rash noted. Psychiatric: Mood and affect are normal. Speech and behavior are normal.  ____________________________________________   LABS (all labs ordered are listed, but only abnormal results are displayed)  Labs Reviewed  COMPREHENSIVE METABOLIC PANEL - Abnormal; Notable for the following:       Result Value   Potassium 2.6 (*)    Chloride 93 (*)    Glucose, Bld 129 (*)    BUN 28 (*)    AST 82 (*)    ALT 71 (*)    All other components within normal limits  CBC - Abnormal;  Notable for the following:    WBC 12.0 (*)    All other components within normal limits  URINALYSIS, COMPLETE (UACMP) WITH MICROSCOPIC - Abnormal; Notable for the following:    Color, Urine YELLOW (*)    APPearance CLEAR (*)    Hgb urine dipstick SMALL (*)    Protein, ur 30 (*)    Leukocytes, UA TRACE (*)    Squamous Epithelial / LPF 0-5 (*)    All other components within normal limits  LIPASE, BLOOD  TROPONIN I   ____________________________________________  EKG  ED ECG REPORT I, Doran Stabler, the attending physician, personally viewed and interpreted this ECG.   Date: 04/12/2017  EKG Time: 1710  Rate: 117  Rhythm: sinus tachycardia  Axis: Left axis  Intervals:none  ST&T Change: No ST segment elevation or depression. No abnormal T-wave inversion.  ____________________________________________  RADIOLOGY  ___________________________________________   PROCEDURES  Procedure(s) performed:   Procedures  Critical Care performed:   ____________________________________________   INITIAL IMPRESSION / ASSESSMENT AND PLAN / ED  COURSE  Pertinent labs & imaging results that were available during my care of the patient were reviewed by me and considered in my medical decision making (see chart for details).  Elevated blood cell count but without any tenderness to palpation. We will await the remainder of his lab work and reassess. Patient will be treated symptomatically with fluids as well as Zofran. We will also replete his potassium.    ----------------------------------------- 8:14 PM on 04/12/2017 -----------------------------------------  Patient with hypokalemia. Given 40 mEq of potassium. We'll discharge with 2 more doses of potassium. Patient still without any pain. Able tolerate by mouth fluids. Says that the nausea has improved. Because of the patient's psych meds at home I will not be discharging her with antiemetics because of the potential interaction causing Q-T prolongation. He says that he has not had any episodes of vomiting in the emergency department today. No episodes of diarrhea. The patient as well as his group home are present of nose to return for any worsening or concerning symptoms. Heart rate is 97 on reexamination. On reexamination the patient continued to be soft and nontender throughout all quadrants.  ____________________________________________   FINAL CLINICAL IMPRESSION(S) / ED DIAGNOSES  Nausea vomiting and diarrhea.    NEW MEDICATIONS STARTED DURING THIS VISIT:  New Prescriptions   No medications on file     Note:  This document was prepared using Dragon voice recognition software and may include unintentional dictation errors.     Orbie Pyo, MD 04/12/17 2015    Orbie Pyo, MD 04/12/17 2016

## 2017-04-12 NOTE — ED Notes (Signed)
Patient's group home representative is at bedside with patient.

## 2017-04-12 NOTE — ED Notes (Signed)
Report to Samantha RN

## 2017-04-12 NOTE — ED Triage Notes (Signed)
Vomiting since Sunday.  States vomiting is intermittent.

## 2017-04-12 NOTE — Patient Instructions (Signed)
Danny Greene  04/12/2017     @PREFPERIOPPHARMACY @   Your procedure is scheduled on 04/22/2017.  Report to [facility] at 04/22/2017 A.M.  Call this number if you have problems the morning of surgery:  [phone]   Remember:  Do not eat food or drink liquids after midnight.  Take these medicines the morning of surgery with A SIP OF WATER Clozaril, Lovenox, Zofran, Protonix   Do not wear jewelry, make-up or nail polish.  Do not wear lotions, powders, or perfumes, or deoderant.  Do not shave 48 hours prior to surgery.  Men may shave face and neck.  Do not bring valuables to the hospital.  [facility] is not responsible for any belongings or valuables.  Contacts, dentures or bridgework may not be worn into surgery.  Leave your suitcase in the car.  After surgery it may be brought to your room.  For patients admitted to the hospital, discharge time will be determined by your treatment team.  Patients discharged the day of surgery will not be allowed to drive home.    Please read over the following fact sheets that you were given. Anesthesia Post-op Instructions     PATIENT INSTRUCTIONS POST-ANESTHESIA  IMMEDIATELY FOLLOWING SURGERY:  Do not drive or operate machinery for the first twenty four hours after surgery.  Do not make any important decisions for twenty four hours after surgery or while taking narcotic pain medications or sedatives.  If you develop intractable nausea and vomiting or a severe headache please notify your doctor immediately.  FOLLOW-UP:  Please make an appointment with your surgeon as instructed. You do not need to follow up with anesthesia unless specifically instructed to do so.  WOUND CARE INSTRUCTIONS (if applicable):  Keep a dry clean dressing on the anesthesia/puncture wound site if there is drainage.  Once the wound has quit draining you may leave it open to air.  Generally you should leave the bandage intact for twenty four hours unless there is  drainage.  If the epidural site drains for more than 36-48 hours please call the anesthesia department.  QUESTIONS?:  Please feel free to call your physician or the hospital operator if you have any questions, and they will be happy to assist you.      Colonoscopy, Adult A colonoscopy is an exam to look at the entire large intestine. During the exam, a lubricated, bendable tube is inserted into the anus and then passed into the rectum, colon, and other parts of the large intestine. A colonoscopy is often done as a part of normal colorectal screening or in response to certain symptoms, such as anemia, persistent diarrhea, abdominal pain, and blood in the stool. The exam can help screen for and diagnose medical problems, including:  Tumors.  Polyps.  Inflammation.  Areas of bleeding.  Tell a health care provider about:  Any allergies you have.  All medicines you are taking, including vitamins, herbs, eye drops, creams, and over-the-counter medicines.  Any problems you or family members have had with anesthetic medicines.  Any blood disorders you have.  Any surgeries you have had.  Any medical conditions you have.  Any problems you have had passing stool. What are the risks? Generally, this is a safe procedure. However, problems may occur, including:  Bleeding.  A tear in the intestine.  A reaction to medicines given during the exam.  Infection (rare).  What happens before the procedure? Eating and drinking restrictions Follow instructions from your health care  provider about eating and drinking, which may include:  A few days before the procedure - follow a low-fiber diet. Avoid nuts, seeds, dried fruit, raw fruits, and vegetables.  1-3 days before the procedure - follow a clear liquid diet. Drink only clear liquids, such as clear broth or bouillon, black coffee or tea, clear juice, clear soft drinks or sports drinks, gelatin dessert, and popsicles. Avoid any liquids  that contain red or purple dye.  On the day of the procedure - do not eat or drink anything during the 2 hours before the procedure, or within the time period that your health care provider recommends.  Bowel prep If you were prescribed an oral bowel prep to clean out your colon:  Take it as told by your health care provider. Starting the day before your procedure, you will need to drink a large amount of medicated liquid. The liquid will cause you to have multiple loose stools until your stool is almost clear or light green.  If your skin or anus gets irritated from diarrhea, you may use these to relieve the irritation: ? Medicated wipes, such as adult wet wipes with aloe and vitamin E. ? A skin soothing-product like petroleum jelly.  If you vomit while drinking the bowel prep, take a break for up to 60 minutes and then begin the bowel prep again. If vomiting continues and you cannot take the bowel prep without vomiting, call your health care provider.  General instructions  Ask your health care provider about changing or stopping your regular medicines. This is especially important if you are taking diabetes medicines or blood thinners.  Plan to have someone take you home from the hospital or clinic. What happens during the procedure?  An IV tube may be inserted into one of your veins.  You will be given medicine to help you relax (sedative).  To reduce your risk of infection: ? Your health care team will wash or sanitize their hands. ? Your anal area will be washed with soap.  You will be asked to lie on your side with your knees bent.  Your health care provider will lubricate a long, thin, flexible tube. The tube will have a camera and a light on the end.  The tube will be inserted into your anus.  The tube will be gently eased through your rectum and colon.  Air will be delivered into your colon to keep it open. You may feel some pressure or cramping.  The camera will be  used to take images during the procedure.  A small tissue sample may be removed from your body to be examined under a microscope (biopsy). If any potential problems are found, the tissue will be sent to a lab for testing.  If small polyps are found, your health care provider may remove them and have them checked for cancer cells.  The tube that was inserted into your anus will be slowly removed. The procedure may vary among health care providers and hospitals. What happens after the procedure?  Your blood pressure, heart rate, breathing rate, and blood oxygen level will be monitored until the medicines you were given have worn off.  Do not drive for 24 hours after the exam.  You may have a small amount of blood in your stool.  You may pass gas and have mild abdominal cramping or bloating due to the air that was used to inflate your colon during the exam.  It is up to you to get  the results of your procedure. Ask your health care provider, or the department performing the procedure, when your results will be ready. This information is not intended to replace advice given to you by your health care provider. Make sure you discuss any questions you have with your health care provider. Document Released: 08/17/2000 Document Revised: 06/20/2016 Document Reviewed: 11/01/2015 Elsevier Interactive Patient Education  2018 [employer].

## 2017-04-16 ENCOUNTER — Encounter (HOSPITAL_COMMUNITY): Payer: Self-pay

## 2017-04-16 ENCOUNTER — Encounter (HOSPITAL_COMMUNITY)
Admission: RE | Admit: 2017-04-16 | Discharge: 2017-04-16 | Disposition: A | Discharge status: Home / Self Care | Payer: Medicaid Other | Source: Ambulatory Visit | Attending: Internal Medicine | Admitting: Internal Medicine

## 2017-04-16 DIAGNOSIS — K219 Gastro-esophageal reflux disease without esophagitis: Secondary | ICD-10-CM | POA: Diagnosis not present

## 2017-04-16 DIAGNOSIS — Z9889 Other specified postprocedural states: Secondary | ICD-10-CM | POA: Insufficient documentation

## 2017-04-16 DIAGNOSIS — Z9049 Acquired absence of other specified parts of digestive tract: Secondary | ICD-10-CM | POA: Insufficient documentation

## 2017-04-16 DIAGNOSIS — I10 Essential (primary) hypertension: Secondary | ICD-10-CM | POA: Diagnosis not present

## 2017-04-16 DIAGNOSIS — Z01812 Encounter for preprocedural laboratory examination: Secondary | ICD-10-CM | POA: Insufficient documentation

## 2017-04-16 DIAGNOSIS — F17210 Nicotine dependence, cigarettes, uncomplicated: Secondary | ICD-10-CM | POA: Insufficient documentation

## 2017-04-16 DIAGNOSIS — F209 Schizophrenia, unspecified: Secondary | ICD-10-CM | POA: Insufficient documentation

## 2017-04-16 DIAGNOSIS — Z79899 Other long term (current) drug therapy: Secondary | ICD-10-CM | POA: Insufficient documentation

## 2017-04-16 DIAGNOSIS — K5900 Constipation, unspecified: Secondary | ICD-10-CM | POA: Diagnosis not present

## 2017-04-16 HISTORY — DX: Personal history of urinary calculi: Z87.442

## 2017-04-16 LAB — CBC WITH DIFFERENTIAL/PLATELET
BASOS ABS: 0 10*3/uL (ref 0.0–0.1)
BASOS PCT: 0 %
Eosinophils Absolute: 0.2 10*3/uL (ref 0.0–0.7)
Eosinophils Relative: 3 %
HEMATOCRIT: 38.3 % — AB (ref 39.0–52.0)
Hemoglobin: 12.9 g/dL — ABNORMAL LOW (ref 13.0–17.0)
Lymphocytes Relative: 27 %
Lymphs Abs: 1.8 10*3/uL (ref 0.7–4.0)
MCH: 32.7 pg (ref 26.0–34.0)
MCHC: 33.7 g/dL (ref 30.0–36.0)
MCV: 97.2 fL (ref 78.0–100.0)
MONO ABS: 0.8 10*3/uL (ref 0.1–1.0)
Monocytes Relative: 11 %
NEUTROS ABS: 4 10*3/uL (ref 1.7–7.7)
Neutrophils Relative %: 59 %
PLATELETS: 260 10*3/uL (ref 150–400)
RBC: 3.94 MIL/uL — ABNORMAL LOW (ref 4.22–5.81)
RDW: 12.3 % (ref 11.5–15.5)
WBC: 6.8 10*3/uL (ref 4.0–10.5)

## 2017-04-16 LAB — BASIC METABOLIC PANEL
ANION GAP: 10 (ref 5–15)
BUN: 8 mg/dL (ref 6–20)
CALCIUM: 9.1 mg/dL (ref 8.9–10.3)
CO2: 23 mmol/L (ref 22–32)
Chloride: 104 mmol/L (ref 101–111)
Creatinine, Ser: 0.89 mg/dL (ref 0.61–1.24)
Glucose, Bld: 95 mg/dL (ref 65–99)
Potassium: 3.4 mmol/L — ABNORMAL LOW (ref 3.5–5.1)
SODIUM: 137 mmol/L (ref 135–145)

## 2017-04-16 NOTE — Progress Notes (Signed)
Potassium much better but would give potassium chloride 33meq bid for 2 days. No refills.

## 2017-04-17 ENCOUNTER — Other Ambulatory Visit: Payer: Self-pay | Admitting: Gastroenterology

## 2017-04-17 ENCOUNTER — Telehealth: Payer: Self-pay

## 2017-04-17 MED ORDER — POTASSIUM CHLORIDE ER 10 MEQ PO TBCR: 20.0000 meq | EXTENDED_RELEASE_TABLET | Freq: Every day | ORAL | 0 refills | Status: DC

## 2017-04-17 NOTE — Telephone Encounter (Signed)
Thanks

## 2017-04-17 NOTE — Telephone Encounter (Signed)
I sent in Rx incorrectly for potassium. I sent it in for potassium 60meq 2 tablets once a day for 2 days #8, it should have been potassium 71meq 2 tablets twice a day for 2 days #8. I called [facility] and have corrected the rx with Cherly Beach the pharmacist. He said he would take care of it.  Routing to LSL for FYI.

## 2017-04-19 ENCOUNTER — Telehealth: Payer: Self-pay

## 2017-04-19 NOTE — Telephone Encounter (Signed)
Pt is in nursing home and can not move his time and earlier.

## 2017-04-22 ENCOUNTER — Ambulatory Visit (HOSPITAL_COMMUNITY): Payer: Medicaid Other | Admitting: Anesthesiology

## 2017-04-22 ENCOUNTER — Ambulatory Visit (HOSPITAL_COMMUNITY)
Admission: RE | Admit: 2017-04-22 | Discharge: 2017-04-22 | Disposition: A | Discharge status: Home / Self Care | Payer: Medicaid Other | Source: Ambulatory Visit | Attending: Internal Medicine | Admitting: Internal Medicine

## 2017-04-22 ENCOUNTER — Encounter (HOSPITAL_COMMUNITY): Payer: Self-pay

## 2017-04-22 ENCOUNTER — Telehealth: Payer: Self-pay

## 2017-04-22 ENCOUNTER — Encounter (HOSPITAL_COMMUNITY)
Admission: RE | Disposition: A | Discharge status: Home / Self Care | Payer: Self-pay | Source: Ambulatory Visit | Attending: Internal Medicine

## 2017-04-22 DIAGNOSIS — Z79899 Other long term (current) drug therapy: Secondary | ICD-10-CM | POA: Insufficient documentation

## 2017-04-22 DIAGNOSIS — I10 Essential (primary) hypertension: Secondary | ICD-10-CM | POA: Insufficient documentation

## 2017-04-22 DIAGNOSIS — K210 Gastro-esophageal reflux disease with esophagitis: Secondary | ICD-10-CM | POA: Diagnosis not present

## 2017-04-22 DIAGNOSIS — K449 Diaphragmatic hernia without obstruction or gangrene: Secondary | ICD-10-CM | POA: Insufficient documentation

## 2017-04-22 DIAGNOSIS — K2210 Ulcer of esophagus without bleeding: Secondary | ICD-10-CM | POA: Insufficient documentation

## 2017-04-22 DIAGNOSIS — D125 Benign neoplasm of sigmoid colon: Secondary | ICD-10-CM | POA: Insufficient documentation

## 2017-04-22 DIAGNOSIS — F209 Schizophrenia, unspecified: Secondary | ICD-10-CM | POA: Diagnosis not present

## 2017-04-22 DIAGNOSIS — R195 Other fecal abnormalities: Secondary | ICD-10-CM | POA: Diagnosis not present

## 2017-04-22 DIAGNOSIS — D122 Benign neoplasm of ascending colon: Secondary | ICD-10-CM | POA: Insufficient documentation

## 2017-04-22 DIAGNOSIS — K6389 Other specified diseases of intestine: Secondary | ICD-10-CM | POA: Insufficient documentation

## 2017-04-22 DIAGNOSIS — D649 Anemia, unspecified: Secondary | ICD-10-CM

## 2017-04-22 DIAGNOSIS — K209 Esophagitis, unspecified: Secondary | ICD-10-CM

## 2017-04-22 DIAGNOSIS — F17210 Nicotine dependence, cigarettes, uncomplicated: Secondary | ICD-10-CM | POA: Insufficient documentation

## 2017-04-22 DIAGNOSIS — D123 Benign neoplasm of transverse colon: Secondary | ICD-10-CM | POA: Diagnosis not present

## 2017-04-22 HISTORY — PX: COLONOSCOPY WITH PROPOFOL: SHX5780

## 2017-04-22 HISTORY — PX: BIOPSY: SHX5522

## 2017-04-22 HISTORY — PX: ESOPHAGOGASTRODUODENOSCOPY (EGD) WITH PROPOFOL: SHX5813

## 2017-04-22 LAB — POCT I-STAT 4, (NA,K, GLUC, HGB,HCT)
Glucose, Bld: 84 mg/dL (ref 65–99)
HCT: 40 % (ref 39.0–52.0)
Hemoglobin: 13.6 g/dL (ref 13.0–17.0)
POTASSIUM: 4.6 mmol/L (ref 3.5–5.1)
SODIUM: 143 mmol/L (ref 135–145)

## 2017-04-22 SURGERY — COLONOSCOPY WITH PROPOFOL
Anesthesia: Monitor Anesthesia Care

## 2017-04-22 MED ORDER — MIDAZOLAM HCL 2 MG/2ML IJ SOLN
1.0000 mg | Freq: Once | INTRAMUSCULAR | Status: AC | PRN
  Administered 2017-04-22: 2 mg via INTRAVENOUS

## 2017-04-22 MED ORDER — MIDAZOLAM HCL 2 MG/2ML IJ SOLN
INTRAMUSCULAR | Status: AC
  Filled 2017-04-22: qty 2

## 2017-04-22 MED ORDER — PROPOFOL 500 MG/50ML IV EMUL
INTRAVENOUS | Status: DC | PRN
  Administered 2017-04-22: 13:00:00 via INTRAVENOUS
  Administered 2017-04-22: 125 ug/kg/min via INTRAVENOUS

## 2017-04-22 MED ORDER — LACTATED RINGERS IV SOLN
INTRAVENOUS | Status: DC
  Administered 2017-04-22: 12:00:00 via INTRAVENOUS

## 2017-04-22 MED ORDER — LIDOCAINE VISCOUS 2 % MT SOLN
OROMUCOSAL | Status: AC
  Filled 2017-04-22: qty 15

## 2017-04-22 MED ORDER — MIDAZOLAM HCL 5 MG/5ML IJ SOLN
INTRAMUSCULAR | Status: DC | PRN
  Administered 2017-04-22: 2 mg via INTRAVENOUS

## 2017-04-22 MED ORDER — LIDOCAINE VISCOUS 2 % MT SOLN
15.0000 mL | Freq: Once | OROMUCOSAL | Status: AC
  Administered 2017-04-22: 15 mL via OROMUCOSAL

## 2017-04-22 MED ORDER — PROPOFOL 10 MG/ML IV BOLUS
INTRAVENOUS | Status: AC
  Filled 2017-04-22: qty 40

## 2017-04-22 MED ORDER — CHLORHEXIDINE GLUCONATE CLOTH 2 % EX PADS: 6.0000 | MEDICATED_PAD | Freq: Once | CUTANEOUS | Status: DC

## 2017-04-22 MED ORDER — LIDOCAINE HCL (PF) 1 % IJ SOLN
INTRAMUSCULAR | Status: AC
Start: 2017-04-22 — End: 2017-04-22
  Filled 2017-04-22: qty 5

## 2017-04-22 NOTE — Anesthesia Preprocedure Evaluation (Addendum)
Anesthesia Evaluation    Airway Mallampati: III  TM Distance: >3 FB   Mouth opening: Limited Mouth Opening  Dental  (+) Poor Dentition   Pulmonary Current Smoker,    Pulmonary exam normal        Cardiovascular hypertension, Pt. on medications  Rhythm:Regular Rate:Normal     Neuro/Psych schizophrenia    GI/Hepatic hiatal hernia, PUD, GERD  Controlled and Medicated,Hepatitis C antibody test positive   Endo/Other    Renal/GU Renal disease     Musculoskeletal   Abdominal Normal abdominal exam  (+)   Peds  Hematology   Anesthesia Other Findings   Reproductive/Obstetrics                            Anesthesia Physical Anesthesia Plan  ASA: III  Anesthesia Plan: MAC   Post-op Pain Management:    Induction: Intravenous  PONV Risk Score and Plan:   Airway Management Planned: Mask and Natural Airway  Additional Equipment:   Intra-op Plan:   Post-operative Plan:   Informed Consent: I have reviewed the patients History and Physical, chart, labs and discussed the procedure including the risks, benefits and alternatives for the proposed anesthesia with the patient or authorized representative who has indicated his/her understanding and acceptance.     Plan Discussed with: CRNA  Anesthesia Plan Comments:         Anesthesia Quick Evaluation

## 2017-04-22 NOTE — Op Note (Addendum)
[facility] Patient Name: Danny Greene Procedure Date: 04/22/2017 11:06 AM MRN: [id number] Date of Birth: 1960-10-11 Attending MD: Norvel Richards , MD CSN: [id number] Age: 56 Admit Type: Outpatient Procedure:                Upper GI endoscopy Indications:              Esophageal reflux Providers:                Norvel Richards, MD, Jeanann Lewandowsky. Sharon Seller, RN,                            Lurline Del, RN Referring MD:              Medicines:                Propofol per Anesthesia Complications:            No immediate complications. Estimated Blood Loss:     Estimated blood loss was minimal. Procedure:                Pre-Anesthesia Assessment:                           - Prior to the procedure, a History and Physical                            was performed, and patient medications and                            allergies were reviewed. The patient's tolerance of                            previous anesthesia was also reviewed. The risks                            and benefits of the procedure and the sedation                            options and risks were discussed with the patient.                            All questions were answered, and informed consent                            was obtained. Prior Anticoagulants: The patient has                            taken no previous anticoagulant or antiplatelet                            agents. ASA Grade Assessment: III - A patient with                            severe systemic disease. After reviewing the risks  and benefits, the patient was deemed in                            satisfactory condition to undergo the procedure.                           After obtaining informed consent, the endoscope was                            passed under direct vision. Throughout the                            procedure, the patient's blood pressure, pulse, and                            oxygen saturations  were monitored continuously. The                            EG-[id number] ([id number]) scope was introduced through the                            and advanced to the second part of duodenum. The                            [id number] ([id number]) scope was introduced through the                            and advanced to the. The upper GI endoscopy was                            accomplished without difficulty. The patient                            tolerated the procedure well. Scope In: 12:39:28 PM Scope Out: 12:45:19 PM Total Procedure Duration: 0 hours 5 minutes 51 seconds  Findings:      Esophagitis was found. circumferential distal exudative       erosive/ulcerative reflux esophagitis involving the distal 5 cm of the       tubular esophagus. Difficult to assess for Barrett's epithelium. Area       biopsie with a cold forceps for histology. Estimated blood loss was       minimal.      A medium-sized hiatal hernia was present.      The exam was otherwise without abnormality.      The duodenal bulb and second portion of the duodenum were normal. Impression:               - Severe exudative/erosive esophagitis. Biopsied.                           - Medium-sized hiatal hernia.                           - The examination was otherwise normal.                           -  Normal duodenal bulb and second portion of the                            duodenum. Moderate Sedation:      Moderate (conscious) sedation was personally administered by an       anesthesia professional. The following parameters were monitored: oxygen       saturation, heart rate, blood pressure, respiratory rate, EKG, adequacy       of pulmonary ventilation, and response to care. Total physician       intraservice time was 12 minutes. Recommendation:           - Patient has a contact number available for                            emergencies. The signs and symptoms of potential                            delayed complications were  discussed with the                            patient. Return to normal activities tomorrow.                            Written discharge instructions were provided to the                            patient.                           - Resume previous diet. Increase Protonix to 40 mg                            twice daily. antireflux diet.                           - Continue present medications.                           - No repeat upper endoscopy.                           - Return to GI office in 6 weeks. See colonoscopy                            report. Procedure Code(s):        --- Professional ---                           [phone], Esophagogastroduodenoscopy, flexible,                            transoral; with biopsy, single or multiple Diagnosis Code(s):        --- Professional ---                           K20.9, Esophagitis, unspecified  K44.9, Diaphragmatic hernia without obstruction or                            gangrene                           K21.9, Gastro-esophageal reflux disease without                            esophagitis CPT copyright 2016 American Medical Association. All rights reserved. The codes documented in this report are preliminary and upon coder review may  be revised to meet current compliance requirements. [name]. , MD Norvel Richards, MD 04/22/2017 12:51:18 PM This report has been signed electronically. Number of Addenda: 0

## 2017-04-22 NOTE — Telephone Encounter (Signed)
I wrote a prescription for twice a day Protonix while he was here. Apparently, it was not given to the patient. I understand they will pick it up tomorrow.

## 2017-04-22 NOTE — Telephone Encounter (Signed)
Noted  

## 2017-04-22 NOTE — H&P (Signed)
@LOGO @   Primary Care Physician:  Lauretta Grill, NP Primary Gastroenterologist:  Dr. Gala Romney  Pre-Procedure History & Physical: HPI:  Danny Greene is a 56 y.o. male here for further evaluation history of severe reflux esophagitis. Here for follow-up examination. Also diagnostic colonoscopy being performed for positive fecal DNA test.  Patient denies dysphagia. No prior colonoscopy.  Past Medical History:  Diagnosis Date  . Constipation   . GERD (gastroesophageal reflux disease)   . Hepatitis C antibody test positive    HCV RNA negative 11/2015  . History of kidney stones   . Hypertension   . Schizophrenia [facility])     Past Surgical History:  Procedure Laterality Date  . CHOLECYSTECTOMY    . ESOPHAGEAL DILATION N/A 05/26/2015   Procedure: ESOPHAGEAL DILATION;  Surgeon: Daneil Dolin, MD;  Location: [facility];  Service: Endoscopy;  Laterality: N/A;  . ESOPHAGEAL DILATION  09/21/2015   Procedure: ESOPHAGEAL DILATION;  Surgeon: Daneil Dolin, MD;  Location: [facility];  Service: Endoscopy;;  . ESOPHAGOGASTRODUODENOSCOPY N/A 05/26/2015   RMR: Severe ulcerative esophagitits ad described. Status post biopsy. Hiatal hernia.   Marland Kitchen ESOPHAGOGASTRODUODENOSCOPY N/A 09/21/2015   RMR: Abnormal distal esophagus query short segment Barretts status post biopsy. Severe esophagitis seen previously has resolved. Hiatal hernia. Status post maloney dilation prior to biopsy. No barrett's  . ESOPHAGOGASTRODUODENOSCOPY (EGD) WITH PROPOFOL N/A 02/01/2017   Dr. Gala Romney: LA grade D esophagitis with overlying ribbonlike plaques, KOH few yeast. Marked friability of esophageal mucosa. Recommend EGD in 3 months, twice a day PPI.  Marland Kitchen KIDNEY STONE SURGERY      Prior to Admission medications   Medication Sig Start Date End Date Taking? Authorizing Provider  Cholecalciferol (VITAMIN D) 2000 units CAPS Take 1 capsule by mouth daily.   Yes [provider]  clozapine (CLOZARIL) 200 MG tablet Take 200 mg by  mouth at bedtime.    Yes [provider]  fluvoxaMINE (LUVOX) 100 MG tablet Take 100 mg by mouth at bedtime. sleep   Yes [provider]  gemfibrozil (LOPID) 600 MG tablet Take 600 mg by mouth 2 (two) times daily before a meal.   Yes [provider]  pantoprazole (PROTONIX) 40 MG tablet Take 1 tablet (40 mg total) by mouth 2 (two) times daily before a meal. Patient taking differently: Take 40 mg by mouth daily.  03/21/17  Yes Mahala Menghini, PA-C  potassium chloride (K-DUR) 10 MEQ tablet Take 2 tablets (20 mEq total) by mouth daily. For 2 days 04/17/17  Yes Mahala Menghini, PA-C  senna (SENOKOT) 8.6 MG TABS tablet Take 2 tablets by mouth at bedtime.    Yes [provider]  ondansetron (ZOFRAN-ODT) 4 MG disintegrating tablet Take 4 mg by mouth every 8 (eight) hours as needed for nausea or vomiting.    [provider]  potassium chloride (K-DUR) 10 MEQ tablet Take 4 tablets (40 mEq total) by mouth 2 (two) times daily. 04/12/17 04/13/17  Orbie Pyo, MD    Allergies as of 03/21/2017  . (No Known Allergies)    Family History  Problem Relation Age of Onset  . Liver disease Neg Hx   . Colon cancer Neg Hx     Social History   Social History  . Marital status: Single    Spouse name: N/A  . Number of children: N/A  . Years of education: N/A   Occupational History  . Not on file.   Social History Main Topics  .  Smoking status: Current Every Day Smoker    Packs/day: 1.00    Years: 2.00    Types: Cigarettes  . Smokeless tobacco: Never Used  . Alcohol use No     Comment: states "liquor every chance I can"; denied 03/21/17  . Drug use: No  . Sexual activity: Not Currently   Other Topics Concern  . Not on file   Social History Narrative  . No narrative on file    Review of Systems: See HPI, otherwise negative ROS  Physical Exam: Pulse 87   Temp 97.7 F (36.5 C) (Oral)   Resp 16   SpO2 97%  General:   Alert,  Disheveled  pleasant and cooperative in NAD Neck:  Supple; no masses or thyromegaly. No significant cervical adenopathy. Lungs:  Clear throughout to auscultation.   No wheezes, crackles, or rhonchi. No acute distress. Heart:  Regular rate and rhythm; no murmurs, clicks, rubs,  or gallops. Abdomen: Non-distended, normal bowel sounds.  Soft and nontender without appreciable mass or hepatosplenomegaly.  Pulses:  Normal pulses noted. Extremities:  Without clubbing or edema.  Impression / Recommendations:  Pleasant 56 year old gentleman with long-standing complicated GERD. Now has a positive fecal DNA test.  Diagnostic EGD and colonoscopy today per plan.  The risks, benefits, limitations, imponderables and alternatives regarding both EGD and colonoscopy have been reviewed with the patient. Questions have been answered. All parties agreeable.        Notice: This dictation was prepared with Dragon dictation along with smaller phrase technology. Any transcriptional errors that result from this process are unintentional and may not be corrected upon review.

## 2017-04-22 NOTE — Anesthesia Postprocedure Evaluation (Signed)
Anesthesia Post Note  Patient: Danny Greene  Procedure(s) Performed: Procedure(s) (LRB): COLONOSCOPY WITH PROPOFOL (N/A) ESOPHAGOGASTRODUODENOSCOPY (EGD) WITH PROPOFOL (N/A) BIOPSY  Patient location during evaluation: PACU Anesthesia Type: MAC Level of consciousness: awake, oriented and patient uncooperative Pain management: pain level controlled Vital Signs Assessment: post-procedure vital signs reviewed and stable Respiratory status: spontaneous breathing, nonlabored ventilation and respiratory function stable Cardiovascular status: blood pressure returned to baseline Postop Assessment: adequate PO intake Anesthetic complications: no     Last Vitals:  Vitals:   04/22/17 1213 04/22/17 1328  BP:  99/74  Pulse: 87 76  Resp: 16   Temp: 36.5 C 36.9 C  SpO2: 97% 100%    Last Pain:  Vitals:   04/22/17 1328  TempSrc:   PainSc: Asleep                 Braelyn Bordonaro J

## 2017-04-22 NOTE — Op Note (Signed)
[facility] Patient Name: Danny Greene Procedure Date: 04/22/2017 12:49 PM MRN: [id number] Date of Birth: 12-27-1960 Attending MD: Norvel Richards , MD CSN: [id number] Age: 56 Admit Type: Outpatient Procedure:                Colonoscopy Indications:              Positive Cologuard test Providers:                Norvel Richards, MD, Gwenlyn Fudge RN, RN, Lurline Del, RN Referring MD:              Medicines:                Propofol per Anesthesia Complications:            No immediate complications. Estimated Blood Loss:     Estimated blood loss was minimal. Procedure:                Pre-Anesthesia Assessment:                           - Prior to the procedure, a History and Physical                            was performed, and patient medications and                            allergies were reviewed. The patient's tolerance of                            previous anesthesia was also reviewed. The risks                            and benefits of the procedure and the sedation                            options and risks were discussed with the patient.                            All questions were answered, and informed consent                            was obtained. Prior Anticoagulants: The patient has                            taken no previous anticoagulant or antiplatelet                            agents. ASA Grade Assessment: III - A patient with                            severe systemic disease. After reviewing the risks  and benefits, the patient was deemed in                            satisfactory condition to undergo the procedure.                           After obtaining informed consent, the colonoscope                            was passed under direct vision. Throughout the                            procedure, the patient's blood pressure, pulse, and                            oxygen saturations  were monitored continuously. The                            Colonoscope was introduced through the and advanced                            to the 5 cm into the ileum. The terminal ileum,                            ileocecal valve, appendiceal orifice, and rectum                            were photographed. The quality of the bowel                            preparation was adequate. Scope In: 12:52:42 PM Scope Out: 1:20:40 PM Scope Withdrawal Time: 0 hours 22 minutes 7 seconds  Total Procedure Duration: 0 hours 27 minutes 58 seconds  Findings:      The perianal and digital rectal examinations were normal. Diffusely       pigmented colonic mucosa.      Five semi-pedunculated polyps were found in the sigmoid colon, splenic       flexure and ascending colon. The polyps were 4 to 6 mm in size. These       polyps were removed with a cold snare. Resection and retrieval were       complete. Estimated blood loss was minimal.      The exam was otherwise without abnormality on direct and retroflexion       views. Impression:               - Five 4 to 6 mm polyps in the sigmoid colon, at                            the splenic flexure and in the ascending colon,                            removed with a cold snare. Resected and retrieved.                           -  The examination was otherwise normal on direct                            and retroflexion views. Melanosis coli Moderate Sedation:      Moderate (conscious) sedation was personally administered by an       anesthesia professional. The following parameters were monitored: oxygen       saturation, heart rate, blood pressure, respiratory rate, EKG, adequacy       of pulmonary ventilation, and response to care. Total physician       intraservice time was 47 minutes. Recommendation:           - Patient has a contact number available for                            emergencies. The signs and symptoms of potential                             delayed complications were discussed with the                            patient. Return to normal activities tomorrow.                            Written discharge instructions were provided to the                            patient.                           - Resume previous diet.                           - Continue present medications.                           - Repeat colonoscopy date to be determined after                            pending pathology results are reviewed for                            surveillance based on pathology results.                           - Return to my office in 3 months. See EGD report. Procedure Code(s):        --- Professional ---                           [phone], Colonoscopy, flexible; with removal of                            tumor(s), polyp(s), or other lesion(s) by snare                            technique Diagnosis Code(s):        ---  Professional ---                           D12.5, Benign neoplasm of sigmoid colon                           D12.3, Benign neoplasm of transverse colon (hepatic                            flexure or splenic flexure)                           D12.2, Benign neoplasm of ascending colon                           R19.5, Other fecal abnormalities CPT copyright 2016 American Medical Association. All rights reserved. The codes documented in this report are preliminary and upon coder review may  be revised to meet current compliance requirements. [name]. Prisilla Kocsis, MD Norvel Richards, MD 04/22/2017 1:29:28 PM This report has been signed electronically. Number of Addenda: 0

## 2017-04-22 NOTE — Telephone Encounter (Signed)
Received a call from [facility], the received the discharge paperwork from the pts procedures today and they said the pt has been taking his protonix once a day, they did not have an rx for twice a day. I checked epic and it was sent in to [facility] on 03/21/17 by [facility] for bid. The nurse from [facility] said she had called [facility] and spoke with Butch Penny and they did not have an rx for it. I called [facility] and spoke with Butch Penny, informed her that we had sent in an rx for protonix bid and gave her the date. She looked and said she did have it, it was put on file for the pt and she would make sure [facility] got the Rx for the pt.   Routing to RMR for FYI.

## 2017-04-22 NOTE — Discharge Instructions (Signed)
°Colonoscopy °Discharge Instructions ° °Read the instructions outlined below and refer to this sheet in the next few weeks. These discharge instructions provide you with general information on caring for yourself after you leave the hospital. Your doctor may also give you specific instructions. While your treatment has been planned according to the most current medical practices available, unavoidable complications occasionally occur. If you have any problems or questions after discharge, call Dr. Honest Vanleer at [phone]. °ACTIVITY °· You may resume your regular activity, but move at a slower pace for the next 24 hours.  °· Take frequent rest periods for the next 24 hours.  °· Walking will help get rid of the air and reduce the bloated feeling in your belly (abdomen).  °· No driving for 24 hours (because of the medicine (anesthesia) used during the test).   °· Do not sign any important legal documents or operate any machinery for 24 hours (because of the anesthesia used during the test).  °NUTRITION °· Drink plenty of fluids.  °· You may resume your normal diet as instructed by your doctor.  °· Begin with a light meal and progress to your normal diet. Heavy or fried foods are harder to digest and may make you feel sick to your stomach (nauseated).  °· Avoid alcoholic beverages for 24 hours or as instructed.  °MEDICATIONS °· You may resume your normal medications unless your doctor tells you otherwise.  °WHAT YOU CAN EXPECT TODAY °· Some feelings of bloating in the abdomen.  °· Passage of more gas than usual.  °· Spotting of blood in your stool or on the toilet paper.  °IF YOU HAD POLYPS REMOVED DURING THE COLONOSCOPY: °· No aspirin products for 7 days or as instructed.  °· No alcohol for 7 days or as instructed.  °· Eat a soft diet for the next 24 hours.  °FINDING OUT THE RESULTS OF YOUR TEST °Not all test results are available during your visit. If your test results are not back during the visit, make an appointment  with your caregiver to find out the results. Do not assume everything is normal if you have not heard from your caregiver or the medical facility. It is important for you to follow up on all of your test results.  °SEEK IMMEDIATE MEDICAL ATTENTION IF: °· You have more than a spotting of blood in your stool.  °· Your belly is swollen (abdominal distention).  °· You are nauseated or vomiting.  °· You have a temperature over 101.  °· You have abdominal pain or discomfort that is severe or gets worse throughout the day.  °EGD °Discharge instructions °Please read the instructions outlined below and refer to this sheet in the next few weeks. These discharge instructions provide you with general information on caring for yourself after you leave the hospital. Your doctor may also give you specific instructions. While your treatment has been planned according to the most current medical practices available, unavoidable complications occasionally occur. If you have any problems or questions after discharge, please call your doctor. °ACTIVITY °· You may resume your regular activity but move at a slower pace for the next 24 hours.  °· Take frequent rest periods for the next 24 hours.  °· Walking will help expel (get rid of) the air and reduce the bloated feeling in your abdomen.  °· No driving for 24 hours (because of the anesthesia (medicine) used during the test).  °· You may shower.  °· Do not sign any important   legal documents or operate any machinery for 24 hours (because of the anesthesia used during the test).  NUTRITION  Drink plenty of fluids.   You may resume your normal diet.   Begin with a light meal and progress to your normal diet.   Avoid alcoholic beverages for 24 hours or as instructed by your caregiver.  MEDICATIONS  You may resume your normal medications unless your caregiver tells you otherwise.  WHAT YOU CAN EXPECT TODAY  You may experience abdominal discomfort such as a feeling of fullness  or gas pains.  FOLLOW-UP  Your doctor will discuss the results of your test with you.  SEEK IMMEDIATE MEDICAL ATTENTION IF ANY OF THE FOLLOWING OCCUR:  Excessive nausea (feeling sick to your stomach) and/or vomiting.   Severe abdominal pain and distention (swelling).   Trouble swallowing.   Temperature over 101 F (37.8 C).   Rectal bleeding or vomiting of blood.   GERD and colon polyp information provided  Continue Protonix 40 mg twice daily  Further recommendations to follow pending review of pathology report  Office visit with [place] in 3 months

## 2017-04-22 NOTE — Transfer of Care (Signed)
Immediate Anesthesia Transfer of Care Note  Patient: Danny Greene  Procedure(s) Performed: Procedure(s) with comments: COLONOSCOPY WITH PROPOFOL (N/A) - 1230  ESOPHAGOGASTRODUODENOSCOPY (EGD) WITH PROPOFOL (N/A) BIOPSY - esophagus  Patient Location: PACU  Anesthesia Type:MAC  Level of Consciousness: drowsy and patient cooperative  Airway & Oxygen Therapy: Patient Spontanous Breathing and Patient connected to face mask oxygen  Post-op Assessment: Report given to RN, Post -op Vital signs reviewed and stable and Patient moving all extremities  Post vital signs: Reviewed and stable  Last Vitals:  Vitals:   04/22/17 1213  Pulse: 87  Resp: 16  Temp: 36.5 C  SpO2: 97%    Last Pain:  Vitals:   04/22/17 1213  TempSrc: Oral  PainSc: 3          Complications: No apparent anesthesia complications

## 2017-04-24 ENCOUNTER — Encounter (HOSPITAL_COMMUNITY): Payer: Self-pay | Admitting: Internal Medicine

## 2017-04-24 ENCOUNTER — Encounter: Payer: Self-pay | Admitting: Internal Medicine

## 2017-04-25 ENCOUNTER — Telehealth: Payer: Self-pay

## 2017-04-25 ENCOUNTER — Encounter: Payer: Self-pay | Admitting: Internal Medicine

## 2017-04-25 NOTE — Telephone Encounter (Signed)
Per RMR-  Rourk, [name], MD  Claudina Lick, LPN; Theadora Rama        Send letter to patient.  Send copy of letter with path to referring provider and PCP.    Patient should have an office visit with [place] in 6 weeks

## 2017-04-25 NOTE — Telephone Encounter (Signed)
OV made and letter mailed °

## 2017-04-25 NOTE — Telephone Encounter (Signed)
Letter mailed to the pt. 

## 2017-06-11 ENCOUNTER — Ambulatory Visit: Payer: Self-pay | Admitting: Gastroenterology

## 2017-07-19 ENCOUNTER — Ambulatory Visit (INDEPENDENT_AMBULATORY_CARE_PROVIDER_SITE_OTHER): Payer: Medicaid Other | Admitting: Gastroenterology

## 2017-07-19 ENCOUNTER — Encounter: Payer: Self-pay | Admitting: Gastroenterology

## 2017-07-19 VITALS — BP 127/82 | HR 104 | Temp 96.5°F | Ht 71.0 in | Wt 164.8 lb

## 2017-07-19 DIAGNOSIS — K2100 Gastro-esophageal reflux disease with esophagitis, without bleeding: Secondary | ICD-10-CM

## 2017-07-19 DIAGNOSIS — D649 Anemia, unspecified: Secondary | ICD-10-CM

## 2017-07-19 DIAGNOSIS — R945 Abnormal results of liver function studies: Secondary | ICD-10-CM

## 2017-07-19 DIAGNOSIS — R7989 Other specified abnormal findings of blood chemistry: Secondary | ICD-10-CM

## 2017-07-19 DIAGNOSIS — Z860100 Personal history of colon polyps, unspecified: Secondary | ICD-10-CM

## 2017-07-19 DIAGNOSIS — R768 Other specified abnormal immunological findings in serum: Secondary | ICD-10-CM | POA: Diagnosis not present

## 2017-07-19 NOTE — Assessment & Plan Note (Signed)
Due for follow-up CBC.

## 2017-07-19 NOTE — Patient Instructions (Signed)
1. Continue pantoprazole twice daily.  2. I will review previously labs and let you know if additional labs are needed to follow up on your liver and anemia.  3. Return to the office in six months.

## 2017-07-19 NOTE — Assessment & Plan Note (Signed)
Bump in transaminases in 04/2017 with prior h/o positive HCV Ab, neg RNA. Need to recheck at this time along with HCV RNA. If still up, he will need abd u/s.

## 2017-07-19 NOTE — Progress Notes (Signed)
Primary Care Physician: Lauretta Grill, NP  Primary Gastroenterologist:  Garfield Cornea, MD   Chief Complaint  Patient presents with  . Anemia    doing ok    HPI: Danny Greene is a 56 y.o. male here for follow-up.  Seen back in July for positive Cologuard results.  Also with history of severe esophagitis, Candida esophagitis, positive HCV Ab (neg HCV RNA), anemia.   April 22, 2017, EGD and colonoscopy performed.  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  Difficult to assess for barium's.  Medium hiatal hernia.  Biopsies consistent with ulcerative esophagitis, negative for CMV, HSV, PAS stains.  Suspected reflux related.  Colonoscopy,5 polyps which were removed.  All tubular adenomas.  Next colonoscopy in 3 years.  Clinically patient states he is doing better since his pantoprazole was increased to twice daily.  He denies heartburn.  Unfortunately he went back to smoking.  Staff members mentioned that he is not eating as much since he started back smoking.  He has dropped 15 pounds in the past 3 months and is back to his weight a year ago.  Bowel movements are regular.  No blood in the stool or melena.  No dysphagia.  His transaminases have went up over the past few months.  Back in May he had an AST of 64 in August was 82.  His ALT went from 57 is now up to 71.  We will check correspondence box regarding previous labs requested including recheck HCVRNA to see if that was actually done.  Current Outpatient Medications  Medication Sig Dispense Refill  . Carbamide Peroxide (EAR WAX REMOVAL DROPS OT) Place 10 drops 4 (four) times daily into the right ear. Until gone    . Cholecalciferol (VITAMIN D) 2000 units CAPS Take 1 capsule by mouth daily.    . clozapine (CLOZARIL) 200 MG tablet Take 200 mg by mouth at bedtime.     . fluvoxaMINE (LUVOX) 100 MG tablet Take 100 mg by mouth at bedtime. sleep    .  gemfibrozil (LOPID) 600 MG tablet Take 600 mg by mouth 2 (two) times daily before a meal.    . ondansetron (ZOFRAN-ODT) 4 MG disintegrating tablet Take 4 mg by mouth every 8 (eight) hours as needed for nausea or vomiting.    . pantoprazole (PROTONIX) 40 MG tablet Take 1 tablet (40 mg total) by mouth 2 (two) times daily before a meal. (Patient taking differently: Take 40 mg 2 (two) times daily by mouth. ) 60 tablet 5  . senna (SENOKOT) 8.6 MG TABS tablet Take 2 tablets by mouth at bedtime.     . potassium chloride (K-DUR) 10 MEQ tablet Take 4 tablets (40 mEq total) by mouth 2 (two) times daily. 8 tablet 0   No current facility-administered medications for this visit.     Allergies as of 07/19/2017  . (No Known Allergies)    ROS:  General: Negative for anorexia, weight loss, fever, chills, fatigue, weakness. ENT: Negative for hoarseness, difficulty swallowing , nasal congestion. CV: Negative for chest pain, angina, palpitations, dyspnea on exertion, peripheral edema.  Respiratory: Negative for dyspnea at rest, dyspnea on exertion, cough, sputum, wheezing.  GI: See history of present illness. GU:  Negative for dysuria, hematuria, urinary incontinence, urinary frequency, nocturnal urination.  Endo: Negative for unusual weight change.    Physical Examination:   BP 127/82   Pulse (!) 104   Temp (!)  96.5 F (35.8 C) (Oral)   Ht 5\' 11"  (1.803 m)   Wt 164 lb 12.8 oz (74.8 kg)   BMI 22.98 kg/m   General: Well-nourished, well-developed in no acute distress.  Eyes: No icterus. Mouth: Oropharyngeal mucosa moist and pink , no lesions erythema or exudate. Lungs: Clear to auscultation bilaterally.  Heart: Regular rate and rhythm, no murmurs rubs or gallops.  Abdomen: Bowel sounds are normal, nontender, nondistended, no hepatosplenomegaly or masses, no abdominal bruits or hernia , no rebound or guarding.   Extremities: No lower extremity edema. No clubbing or deformities. Neuro: Alert and  oriented x 4   Skin: Warm and dry, no jaundice.   Psych: Alert and cooperative, normal mood and affect.  Labs:  Lab Results  Component Value Date   CREATININE 0.89 04/16/2017   BUN 8 04/16/2017   NA 143 04/22/2017   K 4.6 04/22/2017   CL 104 04/16/2017   CO2 23 04/16/2017   Lab Results  Component Value Date   ALT 71 (H) 04/12/2017   AST 82 (H) 04/12/2017   ALKPHOS 116 04/12/2017   BILITOT 0.8 04/12/2017   Lab Results  Component Value Date   WBC 6.8 04/16/2017   HGB 13.6 04/22/2017   HCT 40.0 04/22/2017   MCV 97.2 04/16/2017   PLT 260 04/16/2017   No results found for: IRON, TIBC, FERRITIN  Imaging Studies: No results found.

## 2017-07-19 NOTE — Assessment & Plan Note (Signed)
Persistent severe esophagitis distally, last 5 cm of the tubular esophagus.  Patient now back on pantoprazole 40 mg twice daily.  Clinically feels better.  Return for follow-up in 6 months.

## 2017-07-19 NOTE — Assessment & Plan Note (Signed)
Multiple tubular adenomas removed from the colon in August.  Next colonoscopy August 2021.

## 2017-07-19 NOTE — Progress Notes (Signed)
cc'ed to pcp °

## 2017-07-19 NOTE — Progress Notes (Signed)
Please let patient's group home know that we need labs done. Orders were placed.

## 2017-07-31 NOTE — Progress Notes (Signed)
Spoke with Ms. Danny Greene and she is going to have pt complete his lab work

## 2017-08-13 NOTE — Progress Notes (Signed)
Still have not seen any lab results. Please request them to fax copy of last hepatic function panel, HCV RNA, CBC, iron/tibc/ferritin ALL should have been done since 07/2017.

## 2017-08-15 NOTE — Progress Notes (Signed)
Called pts home and they weren't sure if pt had labwork. Labs from [facility] were seen in pts chart, but they couldn't find a date.   Called [facility] and labwork wasn't done with them,  Checking with [facility]

## 2017-08-20 NOTE — Progress Notes (Signed)
Called pts residence and spoke with Mr. Danny Greene. Mr. Danny Greene does recall pt have lab work at [facility]. Will contact [facility] again.

## 2017-08-20 NOTE — Progress Notes (Signed)
Spoke with [facility] and labs have been place on [facility] desk for review.

## 2017-08-26 ENCOUNTER — Other Ambulatory Visit: Payer: Self-pay | Admitting: Internal Medicine

## 2018-01-16 ENCOUNTER — Encounter: Payer: Self-pay | Admitting: Gastroenterology

## 2018-01-16 ENCOUNTER — Ambulatory Visit (INDEPENDENT_AMBULATORY_CARE_PROVIDER_SITE_OTHER): Payer: Medicaid Other | Admitting: Gastroenterology

## 2018-01-16 VITALS — BP 107/70 | HR 109 | Temp 97.7°F | Ht 71.0 in | Wt 164.0 lb

## 2018-01-16 DIAGNOSIS — K2100 Gastro-esophageal reflux disease with esophagitis, without bleeding: Secondary | ICD-10-CM

## 2018-01-16 DIAGNOSIS — D649 Anemia, unspecified: Secondary | ICD-10-CM | POA: Diagnosis not present

## 2018-01-16 DIAGNOSIS — R7989 Other specified abnormal findings of blood chemistry: Secondary | ICD-10-CM

## 2018-01-16 DIAGNOSIS — R945 Abnormal results of liver function studies: Secondary | ICD-10-CM

## 2018-01-16 NOTE — Assessment & Plan Note (Signed)
Clinically patient feels well.  He is on pantoprazole 40 mg twice daily and given the extent of his reflux esophagitis, would continue high dose dose indefinitely.  Return to the office in 6 months.

## 2018-01-16 NOTE — Assessment & Plan Note (Signed)
He is having monthly CBCs by another provider.  Last hemoglobin was normal.  We will go ahead and check for iron deficiency as per initial plan.  Return to the office in 6 months.

## 2018-01-16 NOTE — Assessment & Plan Note (Signed)
Check LFTs, HCVRNA at this time.  Further recommendations to follow.

## 2018-01-16 NOTE — Progress Notes (Signed)
CC'ED TO PCP 

## 2018-01-16 NOTE — Progress Notes (Signed)
Primary Care Physician: Lauretta Grill, NP  Primary Gastroenterologist:  Garfield Cornea, MD   Chief Complaint  Patient presents with  . Anemia    f/u.     HPI: Danny Greene is a 57 y.o. male here for follow-up anemia, reflux esophagitis, abnormal LFTs.  Last seen in November.April 22, 2017, EGD and colonoscopy performed.  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  Difficult to assess for Barrett's.  Medium hiatal hernia.  Biopsies consistent with ulcerative esophagitis, negative for CMV, HSV, PAS stains.  Suspected reflux related.  Colonoscopy,5 polyps which were removed.  All tubular adenomas.  Next colonoscopy in 3 years.  Hgb 13.1 on 01/14/18. Has them monthly per staff.  It is not clear what happened to the labs I ordered in November, we looked back through [facility] labs and the only thing I see dating back over the past 6 months were CBCs.  Clinically patient is feeling well.  He denies any GI complaints.  Bowel movements are regular.  His weight is stable.  He denies heartburn, dysphagia, melena, rectal bleeding.     Current Outpatient Medications  Medication Sig Dispense Refill  . Cholecalciferol (VITAMIN D) 2000 units CAPS Take 1 capsule by mouth daily.    . clozapine (CLOZARIL) 200 MG tablet Take 200 mg by mouth at bedtime.     . fluvoxaMINE (LUVOX) 100 MG tablet Take 100 mg by mouth at bedtime. sleep    . gemfibrozil (LOPID) 600 MG tablet Take 600 mg by mouth 2 (two) times daily before a meal.    . pantoprazole (PROTONIX) 40 MG tablet TAKE (1) TABLET BY MOUTH TWICE DAILY. 60 tablet 3  . senna (SENOKOT) 8.6 MG TABS tablet Take 2 tablets by mouth at bedtime.     . potassium chloride (K-DUR) 10 MEQ tablet Take 4 tablets (40 mEq total) by mouth 2 (two) times daily. (Patient not taking: Reported on 01/16/2018) 8 tablet 0   No current facility-administered medications for this visit.     Allergies  as of 01/16/2018  . (No Known Allergies)    ROS:  General: Negative for anorexia, weight loss, fever, chills, fatigue, weakness. ENT: Negative for hoarseness, difficulty swallowing , nasal congestion. CV: Negative for chest pain, angina, palpitations, dyspnea on exertion, peripheral edema.  Respiratory: Negative for dyspnea at rest, dyspnea on exertion, cough, sputum, wheezing.  GI: See history of present illness. GU:  Negative for dysuria, hematuria, urinary incontinence, urinary frequency, nocturnal urination.  Endo: Negative for unusual weight change.    Physical Examination:   BP 107/70   Pulse (!) 109   Temp 97.7 F (36.5 C) (Oral)   Ht 5\' 11"  (1.803 m)   Wt 164 lb (74.4 kg)   BMI 22.87 kg/m   General: Well-nourished, well-developed in no acute distress.  Eyes: No icterus. Mouth: Oropharyngeal mucosa moist and pink , no lesions erythema or exudate. Lungs: Clear to auscultation bilaterally.  Heart: Regular rate and rhythm, no murmurs rubs or gallops.  Abdomen: Bowel sounds are normal, nontender, nondistended, no hepatosplenomegaly or masses, no abdominal bruits or hernia , no rebound or guarding.   Extremities: No lower extremity edema. No clubbing or deformities. Neuro: Alert and oriented x 4   Skin: Warm and dry, no jaundice.   Psych: Alert and cooperative, normal mood and affect.  Labs:  Lab Results  Component Value Date   CREATININE 0.89 04/16/2017   BUN 8  04/16/2017   NA 143 04/22/2017   K 4.6 04/22/2017   CL 104 04/16/2017   CO2 23 04/16/2017   Lab Results  Component Value Date   ALT 71 (H) 04/12/2017   AST 82 (H) 04/12/2017   ALKPHOS 116 04/12/2017   BILITOT 0.8 04/12/2017    Imaging Studies: No results found.

## 2018-01-16 NOTE — Patient Instructions (Signed)
1. Please go to [facility] and have your labs done today.  2. Return to the office in six months for follow up.

## 2018-01-18 LAB — HCV RNA QUANT RFLX ULTRA OR GENOTYP: HCV QUANT BASELINE: NOT DETECTED [IU]/mL

## 2018-01-18 LAB — HEPATIC FUNCTION PANEL
ALBUMIN: 4.5 g/dL (ref 3.5–5.5)
ALT: 13 IU/L (ref 0–44)
AST: 18 IU/L (ref 0–40)
Alkaline Phosphatase: 166 IU/L — ABNORMAL HIGH (ref 39–117)
Bilirubin, Direct: 0.16 mg/dL (ref 0.00–0.40)
Total Protein: 6.8 g/dL (ref 6.0–8.5)

## 2018-01-18 LAB — IRON,TIBC AND FERRITIN PANEL
Ferritin: 65 ng/mL (ref 30–400)
IRON SATURATION: 29 % (ref 15–55)
Iron: 115 ug/dL (ref 38–169)
TIBC: 403 ug/dL (ref 250–450)
UIBC: 288 ug/dL (ref 111–343)

## 2018-01-22 LAB — SPECIMEN STATUS REPORT

## 2018-01-22 LAB — GAMMA GT: GGT: 13 IU/L (ref 0–65)

## 2018-02-07 ENCOUNTER — Other Ambulatory Visit: Payer: Self-pay

## 2018-02-07 DIAGNOSIS — R945 Abnormal results of liver function studies: Secondary | ICD-10-CM

## 2018-02-07 DIAGNOSIS — R7989 Other specified abnormal findings of blood chemistry: Secondary | ICD-10-CM

## 2018-02-07 NOTE — Progress Notes (Signed)
Tried to call, could not reach pt. Mailing a letter of results and plan to get lab work in 3 weeks. Lab order enclosed with letter to go around 02/27/2018.

## 2018-02-19 ENCOUNTER — Other Ambulatory Visit: Payer: Self-pay | Admitting: Nurse Practitioner

## 2018-03-29 LAB — HEPATIC FUNCTION PANEL
ALT: 14 IU/L (ref 0–44)
AST: 23 IU/L (ref 0–40)
Albumin: 4.7 g/dL (ref 3.5–5.5)
Alkaline Phosphatase: 166 IU/L — ABNORMAL HIGH (ref 39–117)
BILIRUBIN, DIRECT: 0.1 mg/dL (ref 0.00–0.40)
Bilirubin Total: 0.3 mg/dL (ref 0.0–1.2)
TOTAL PROTEIN: 6.9 g/dL (ref 6.0–8.5)

## 2018-04-06 LAB — MITOCHONDRIAL/SMOOTH MUSCLE AB PNL
Mitochondrial Ab: <20 Units (ref 0.0–20.0)
Smooth Muscle Ab: 3 Units (ref 0–19)

## 2018-04-06 LAB — SPECIMEN STATUS REPORT

## 2018-04-18 ENCOUNTER — Other Ambulatory Visit: Payer: Self-pay

## 2018-04-18 DIAGNOSIS — R7989 Other specified abnormal findings of blood chemistry: Secondary | ICD-10-CM

## 2018-04-18 DIAGNOSIS — R945 Abnormal results of liver function studies: Principal | ICD-10-CM

## 2018-06-09 ENCOUNTER — Other Ambulatory Visit: Payer: Self-pay | Admitting: *Deleted

## 2018-06-09 ENCOUNTER — Encounter: Payer: Self-pay | Admitting: *Deleted

## 2018-06-09 DIAGNOSIS — R7989 Other specified abnormal findings of blood chemistry: Secondary | ICD-10-CM

## 2018-06-09 DIAGNOSIS — R945 Abnormal results of liver function studies: Principal | ICD-10-CM

## 2018-06-16 ENCOUNTER — Encounter: Payer: Self-pay | Admitting: Internal Medicine

## 2018-06-20 ENCOUNTER — Other Ambulatory Visit: Payer: Self-pay | Admitting: Gastroenterology

## 2018-09-24 ENCOUNTER — Other Ambulatory Visit: Payer: Self-pay | Admitting: Nurse Practitioner

## 2018-11-20 ENCOUNTER — Ambulatory Visit: Payer: Medicaid Other | Admitting: Gastroenterology

## 2019-01-20 ENCOUNTER — Other Ambulatory Visit: Payer: Self-pay

## 2019-01-20 ENCOUNTER — Ambulatory Visit (INDEPENDENT_AMBULATORY_CARE_PROVIDER_SITE_OTHER): Payer: Medicaid Other | Admitting: Gastroenterology

## 2019-01-20 ENCOUNTER — Encounter: Payer: Self-pay | Admitting: Gastroenterology

## 2019-01-20 DIAGNOSIS — R945 Abnormal results of liver function studies: Secondary | ICD-10-CM

## 2019-01-20 DIAGNOSIS — K2210 Ulcer of esophagus without bleeding: Secondary | ICD-10-CM

## 2019-01-20 DIAGNOSIS — R7989 Other specified abnormal findings of blood chemistry: Secondary | ICD-10-CM

## 2019-01-20 DIAGNOSIS — D649 Anemia, unspecified: Secondary | ICD-10-CM | POA: Diagnosis not present

## 2019-01-20 NOTE — Progress Notes (Signed)
Primary Care Physician:  Lauretta Grill, NP Primary GI:  Garfield Cornea, MD   Patient Location: Home  Provider Location: [place] office  Reason for Phone Visit: Anemia, GERD, abnormal LFTs  Persons present on the phone encounter, with roles: Patient, myself (provider), Charma Igo, [place] (updated meds and allergies), patient's caregiver from [facility] family care  Total time (minutes) spent on medical discussion: 12 minutes  Due to COVID-19, visit was conducted using telephonic method (no video was available).  Visit was requested by patient.  Virtual Visit via Telephone only  I connected with  Danny Greene on 01/20/19 at  8:30 AM EDT by telephone and verified that I am speaking with the correct person using two identifiers.   I discussed the limitations, risks, security and privacy concerns of performing an evaluation and management service by telephone and the availability of in person appointments. I also discussed with the patient that there may be a patient responsible charge related to this service. The patient expressed understanding and agreed to proceed.   HPI:   Patient is a pleasant 58 year old gentleman who presents for telephone visit regarding follow-up anemia, reflux esophagitis, abnormal LFTs.  Patient's caregiver present for visit.  Patient last seen in May 2019.  Last EGD and colonoscopy in August 2018, found to have exudative erosive/ulcerative reflux esophagitis involving the distal 5 cm of the tubular esophagus, difficult to assess for Barrett's.  Medium sized hiatal hernia.  Biopsies consistent with ulcerative esophagitis, negative for CMV, HSV, PAS stains suspected to be reflux related.  Colonoscopy, 5 polyps removed, all tubular adenomas.  Next colonoscopy in August 2021 (3 years). ?consider EGD at that time to access for Barrett's.   He had labs last July for follow-up of abnormal LFTs.  His transaminases had normalized, alkaline phosphatase remained elevated at  166.  Smooth muscle antibody and mitochondrial antibody unremarkable.  GGT 13 iron studies unremarkable, HCVRNA negative.  Clinically he is feeling well.  Heartburn well controlled on pantoprazole twice daily.  No dysphagia.  No abdominal pain.  Bowel movements regular.  No blood in the stool or melena.  Good appetite.  No weight loss.     Current Outpatient Medications  Medication Sig Dispense Refill  . Cholecalciferol (VITAMIN D) 2000 units CAPS Take 1 capsule by mouth daily.    . clozapine (CLOZARIL) 200 MG tablet Take 200 mg by mouth at bedtime.     . fluvoxaMINE (LUVOX) 100 MG tablet Take 100 mg by mouth at bedtime. sleep    . gemfibrozil (LOPID) 600 MG tablet Take 600 mg by mouth 2 (two) times daily before a meal.    . hydrocortisone cream 0.5 % Apply 1 application topically as needed for itching.    . pantoprazole (PROTONIX) 40 MG tablet TAKE (1) TABLET BY MOUTH TWICE DAILY. 60 tablet 5  . senna (SENOKOT) 8.6 MG TABS tablet Take 2 tablets by mouth at bedtime.     . vitamin B-12 (CYANOCOBALAMIN) 100 MCG tablet Take 100 mcg by mouth daily.    . potassium chloride (K-DUR) 10 MEQ tablet Take 4 tablets (40 mEq total) by mouth 2 (two) times daily. (Patient not taking: Reported on 01/16/2018) 8 tablet 0   No current facility-administered medications for this visit.     ROS:  General: Negative for anorexia, weight loss, fever, chills, fatigue, weakness. Eyes: Negative for vision changes.  ENT: Negative for hoarseness, difficulty swallowing , nasal congestion. CV: Negative for chest pain, angina, palpitations, dyspnea on  exertion, peripheral edema.  Respiratory: Negative for dyspnea at rest, dyspnea on exertion, cough, sputum, wheezing.  GI: See history of present illness. GU:  Negative for dysuria, hematuria, urinary incontinence, urinary frequency, nocturnal urination.  MS: Negative for joint pain, low back pain.  Derm: Negative for rash or itching.  Neuro: Negative for weakness,  abnormal sensation, seizure, frequent headaches, memory loss, confusion.  Psych: Negative for anxiety, depression, suicidal ideation, hallucinations.  Endo: Negative for unusual weight change.  Heme: Negative for bruising or bleeding. Allergy: Negative for rash or hives.   Observations/Objective: Pleasant cooperative, male in no acute distress.  Caregiver present to assist visit.  Assessment and Plan: Pleasant 58 year old gentleman with history of severe ulcerative reflux esophagitis, anemia, abnormal LFTs presenting for follow-up.  Clinically he is doing well.  Plan to continue pantoprazole twice daily.  He is due for labs.  We will follow-up on anemia, LFTs.  Will be due for surveillance colonoscopy in August 2021, consider upper endoscopy at that time to assess for Barrett's.  Follow Up Instructions:    I discussed the assessment and treatment plan with the patient. The patient was provided an opportunity to ask questions and all were answered. The patient agreed with the plan and demonstrated an understanding of the instructions. AVS mailed to patient's home address.   The patient was advised to call back or seek an in-person evaluation if the symptoms worsen or if the condition fails to improve as anticipated.  I provided 12 minutes of non-face-to-face time during this encounter.   Neil Crouch, PA-C

## 2019-01-20 NOTE — Patient Instructions (Signed)
1. Continue pantoprazole twice daily before meals. 2. Please go for blood work at [facility].  Orders will be available for approximately 6 months.  Try to go within the next 1 to 2 months.  We will contact you with results as available. 3. Return to the office in 1 year.

## 2019-01-20 NOTE — Progress Notes (Signed)
CC'D TO PCP °

## 2019-02-27 LAB — CBC WITH DIFFERENTIAL/PLATELET
Basophils Absolute: 0 10*3/uL (ref 0.0–0.2)
Basos: 1 %
EOS (ABSOLUTE): 0.3 10*3/uL (ref 0.0–0.4)
Eos: 4 %
Hematocrit: 36.9 % — ABNORMAL LOW (ref 37.5–51.0)
Hemoglobin: 12.7 g/dL — ABNORMAL LOW (ref 13.0–17.7)
Immature Grans (Abs): 0 10*3/uL (ref 0.0–0.1)
Immature Granulocytes: 1 %
Lymphocytes Absolute: 1.3 10*3/uL (ref 0.7–3.1)
Lymphs: 20 %
MCH: 32.9 pg (ref 26.6–33.0)
MCHC: 34.4 g/dL (ref 31.5–35.7)
MCV: 96 fL (ref 79–97)
Monocytes Absolute: 0.8 10*3/uL (ref 0.1–0.9)
Monocytes: 12 %
Neutrophils Absolute: 4.1 10*3/uL (ref 1.4–7.0)
Neutrophils: 62 %
Platelets: 237 10*3/uL (ref 150–450)
RBC: 3.86 x10E6/uL — ABNORMAL LOW (ref 4.14–5.80)
RDW: 11.8 % (ref 11.6–15.4)
WBC: 6.5 10*3/uL (ref 3.4–10.8)

## 2019-02-27 LAB — HEPATIC FUNCTION PANEL
ALT: 30 IU/L (ref 0–44)
AST: 47 IU/L — ABNORMAL HIGH (ref 0–40)
Albumin: 4.6 g/dL (ref 3.8–4.9)
Alkaline Phosphatase: 118 IU/L — ABNORMAL HIGH (ref 39–117)
Bilirubin Total: 0.2 mg/dL (ref 0.0–1.2)
Bilirubin, Direct: 0.08 mg/dL (ref 0.00–0.40)
Total Protein: 7.1 g/dL (ref 6.0–8.5)

## 2019-03-13 ENCOUNTER — Other Ambulatory Visit: Payer: Self-pay

## 2019-03-13 DIAGNOSIS — R7989 Other specified abnormal findings of blood chemistry: Secondary | ICD-10-CM

## 2019-03-23 ENCOUNTER — Other Ambulatory Visit: Payer: Self-pay | Admitting: Gastroenterology

## 2019-05-27 ENCOUNTER — Other Ambulatory Visit: Payer: Self-pay

## 2019-05-27 DIAGNOSIS — R7989 Other specified abnormal findings of blood chemistry: Secondary | ICD-10-CM

## 2019-12-17 ENCOUNTER — Encounter: Payer: Self-pay | Admitting: Internal Medicine

## 2020-03-08 ENCOUNTER — Ambulatory Visit (INDEPENDENT_AMBULATORY_CARE_PROVIDER_SITE_OTHER): Payer: Medicaid Other | Admitting: Gastroenterology

## 2020-03-08 ENCOUNTER — Encounter: Payer: Self-pay | Admitting: Gastroenterology

## 2020-03-08 ENCOUNTER — Other Ambulatory Visit: Payer: Self-pay

## 2020-03-08 VITALS — BP 109/75 | HR 117 | Temp 97.1°F | Ht 71.0 in | Wt 156.0 lb

## 2020-03-08 DIAGNOSIS — D649 Anemia, unspecified: Secondary | ICD-10-CM

## 2020-03-08 DIAGNOSIS — K2100 Gastro-esophageal reflux disease with esophagitis, without bleeding: Secondary | ICD-10-CM | POA: Diagnosis not present

## 2020-03-08 DIAGNOSIS — R7989 Other specified abnormal findings of blood chemistry: Secondary | ICD-10-CM

## 2020-03-08 DIAGNOSIS — Z86010 Personal history of colonic polyps: Secondary | ICD-10-CM | POA: Diagnosis not present

## 2020-03-08 DIAGNOSIS — R945 Abnormal results of liver function studies: Secondary | ICD-10-CM | POA: Diagnosis not present

## 2020-03-08 NOTE — Patient Instructions (Signed)
1. Decrease pantoprazole to 40 mg daily before breakfast.  Call if recurrent heartburn, vomiting, swallowing difficulties. 2. Please have labs done with next blood draw this month.  Please fax results to [phone]. 3. Call if patient's weight drops below 155 pounds. 4. Return to the office in 6 months or call sooner if needed.

## 2020-03-08 NOTE — Progress Notes (Signed)
Primary Care Physician: Lauretta Grill, NP  Primary Gastroenterologist:  Garfield Cornea, MD   Chief Complaint  Patient presents with  . Anemia    f/u. Doing fine  . Gastroesophageal Reflux    f/u. Doing okay    HPI: Danny Greene is a 59 y.o. male here for follow-up.  History of anemia, GERD, abnormal LFTs.  Last visit May 2020 via telephone visit.Last EGD and colonoscopy in August 2018, found to have exudative erosive/ulcerative reflux esophagitis involving the distal 5 cm of the tubular esophagus, difficult to assess for Barrett's.  Medium sized hiatal hernia.  Biopsies consistent with ulcerative esophagitis, negative for CMV, HSV, PAS stains suspected to be reflux related. Colonoscopy, 5 polyps removed, all tubular adenomas.  Next colonoscopy in August 2021 (3 years). ?consider EGD at that time to access for Barrett's.   LFTs repeated in June 2020, alkaline phosphatase nearly normal at 118, AST mildly elevated at 47, ALT 30.  Previous smooth muscle antibody, mitochondrial antibody negative.  GGT normal.  Hemachromatosis screen negative.  HCVRNA negative.  History of fatty liver on CT without contrast 2018.  Patient has had slight decline in weight over the past several months as outlined.  His appetite is good.  He denies any nausea or vomiting, heartburn, dysphagia, abdominal pain.  Bowel movements are regular.  No blood in the stool or melena.  He declines upper endoscopy and colonoscopy at this time.  161 in April 2021. 160 in March 2021. 157.2 in May 2021.  157.0 June 2021.    Current Outpatient Medications  Medication Sig Dispense Refill  . Cholecalciferol (VITAMIN D) 2000 units CAPS Take 1 capsule by mouth daily.    . clozapine (CLOZARIL) 200 MG tablet Take 200 mg by mouth at bedtime.     . fluvoxaMINE (LUVOX) 100 MG tablet Take 100 mg by mouth at bedtime. sleep    . gemfibrozil (LOPID) 600 MG tablet Take 600 mg by mouth 2 (two) times daily before a meal.    .  pantoprazole (PROTONIX) 40 MG tablet Take 1 tablet (40 mg total) by mouth 2 (two) times daily before a meal. 60 tablet 11  . vitamin B-12 (CYANOCOBALAMIN) 100 MCG tablet Take 100 mcg by mouth daily.     No current facility-administered medications for this visit.    Allergies as of 03/08/2020  . (No Known Allergies)    ROS:  General: Negative for anorexia,  fever, chills, fatigue, weakness.  See HPI ENT: Negative for hoarseness, difficulty swallowing , nasal congestion. CV: Negative for chest pain, angina, palpitations, dyspnea on exertion, peripheral edema.  Respiratory: Negative for dyspnea at rest, dyspnea on exertion, cough, sputum, wheezing.  GI: See history of present illness. GU:  Negative for dysuria, hematuria, urinary incontinence, urinary frequency, nocturnal urination.  Endo: Negative for unusual weight change.    Physical Examination:   BP 109/75   Pulse (!) 117   Temp (!) 97.1 F (36.2 C) (Oral)   Ht 5\' 11"  (1.803 m)   Wt 156 lb (70.8 kg)   BMI 21.76 kg/m   General: Well-nourished, well-developed in no acute distress.  Presents with group home staff. Eyes: No icterus. Mouth: masked Lungs: Clear to auscultation bilaterally.  Heart: Regular rate and rhythm, no murmurs rubs or gallops.  Abdomen: Bowel sounds are normal, nontender, nondistended, no hepatosplenomegaly or masses, no abdominal bruits or hernia , no rebound or guarding.   Extremities: No lower extremity edema. No clubbing or deformities. Neuro:  Alert and oriented x 4   Skin: Warm and dry, no jaundice.   Psych: Alert and cooperative, normal mood and affect.  Labs:   Lab Results  Component Value Date   ALT 30 02/26/2019   AST 47 (H) 02/26/2019   GGT 13 01/16/2018   ALKPHOS 118 (H) 02/26/2019   BILITOT 0.2 02/26/2019   Lab Results  Component Value Date   WBC 6.5 02/26/2019   HGB 12.7 (L) 02/26/2019   HCT 36.9 (L) 02/26/2019   MCV 96 02/26/2019   PLT 237 02/26/2019     Imaging  Studies: No results found.   Impression/plan:  Pleasant 59 year old gentleman presenting for follow-up of severe ulcerative reflux esophagitis, anemia, history of tubular adenomas of the colon, abnormal LFTs.  He is due for surveillance colonoscopy at this time, and we had planned upper endoscopy to assess for Barrett's.  Patient declines both of these procedures.  He states clinically he is feeling well.  He is due for updated labs follow-up abnormal LFTs and anemia.  He is concerned about his history of hepatitis C, previous history of positive antibody with negative RNA's.  He states that he completed treatment but we do not have these records.  Group home staff member believes this was several years back.  We will update HCVRNA at this time.  He has a history of fatty liver on prior imaging.  No evidence of overt cirrhosis.  With regards to his reflux, symptoms are well controlled.  He has been on twice daily PPI for quite some time, at this time we will try to get him back on once daily dosing but if he has refractory symptoms we will have to increase to twice daily again.  We will also have the staff monitor his weight and if he drops below 155 pounds they should call us.  Return to the office in 6 months or sooner if needed.

## 2020-03-23 ENCOUNTER — Telehealth: Payer: Self-pay

## 2020-03-23 MED ORDER — PANTOPRAZOLE SODIUM 40 MG PO TBEC: 40.0000 mg | DELAYED_RELEASE_TABLET | Freq: Every day | ORAL | 11 refills | Status: DC

## 2020-03-23 NOTE — Telephone Encounter (Signed)
rx done

## 2020-03-23 NOTE — Telephone Encounter (Signed)
Received a call from [facility]. Pt was decreased to Pantoprazole 40 mg daily at pts last ov 03/08/20. [facility] would like a new script sent in Pt has been receive medication bid.

## 2020-03-23 NOTE — Addendum Note (Signed)
Addended by: Mahala Menghini on: 03/23/2020 02:32 PM   Modules accepted: Orders

## 2020-05-12 ENCOUNTER — Telehealth: Payer: Self-pay | Admitting: Gastroenterology

## 2020-05-12 NOTE — Telephone Encounter (Signed)
Spoke with Joycelyn Schmid at the nursing facility. She is going to discuss labs with the administrator and contact our office back.

## 2020-05-12 NOTE — Telephone Encounter (Signed)
Can we find out if group home had patient's labs done last month?

## 2020-05-19 NOTE — Telephone Encounter (Signed)
Lmom waiting on a return call.  

## 2020-09-12 ENCOUNTER — Ambulatory Visit: Payer: Medicaid Other | Admitting: Gastroenterology

## 2020-09-12 ENCOUNTER — Encounter: Payer: Self-pay | Admitting: Internal Medicine

## 2021-02-20 ENCOUNTER — Emergency Department (HOSPITAL_COMMUNITY): Payer: Medicaid Other

## 2021-02-20 ENCOUNTER — Inpatient Hospital Stay (HOSPITAL_COMMUNITY)
Admission: EM | Admit: 2021-02-20 | Discharge: 2021-02-24 | DRG: 391 | Disposition: A | Discharge status: Home / Self Care | Payer: Medicaid Other | Attending: Family Medicine | Admitting: Family Medicine

## 2021-02-20 ENCOUNTER — Encounter (HOSPITAL_COMMUNITY): Payer: Self-pay | Admitting: Emergency Medicine

## 2021-02-20 ENCOUNTER — Other Ambulatory Visit: Payer: Self-pay

## 2021-02-20 DIAGNOSIS — R6510 Systemic inflammatory response syndrome (SIRS) of non-infectious origin without acute organ dysfunction: Secondary | ICD-10-CM | POA: Diagnosis present

## 2021-02-20 DIAGNOSIS — D649 Anemia, unspecified: Secondary | ICD-10-CM | POA: Diagnosis present

## 2021-02-20 DIAGNOSIS — E872 Acidosis, unspecified: Secondary | ICD-10-CM

## 2021-02-20 DIAGNOSIS — R112 Nausea with vomiting, unspecified: Secondary | ICD-10-CM | POA: Diagnosis present

## 2021-02-20 DIAGNOSIS — K828 Other specified diseases of gallbladder: Secondary | ICD-10-CM | POA: Diagnosis present

## 2021-02-20 DIAGNOSIS — Z6820 Body mass index (BMI) 20.0-20.9, adult: Secondary | ICD-10-CM

## 2021-02-20 DIAGNOSIS — Z8719 Personal history of other diseases of the digestive system: Secondary | ICD-10-CM

## 2021-02-20 DIAGNOSIS — Z79899 Other long term (current) drug therapy: Secondary | ICD-10-CM

## 2021-02-20 DIAGNOSIS — F17210 Nicotine dependence, cigarettes, uncomplicated: Secondary | ICD-10-CM | POA: Diagnosis present

## 2021-02-20 DIAGNOSIS — R1010 Upper abdominal pain, unspecified: Secondary | ICD-10-CM | POA: Diagnosis not present

## 2021-02-20 DIAGNOSIS — K760 Fatty (change of) liver, not elsewhere classified: Secondary | ICD-10-CM | POA: Diagnosis present

## 2021-02-20 DIAGNOSIS — K209 Esophagitis, unspecified without bleeding: Secondary | ICD-10-CM | POA: Diagnosis not present

## 2021-02-20 DIAGNOSIS — Z87442 Personal history of urinary calculi: Secondary | ICD-10-CM

## 2021-02-20 DIAGNOSIS — Z86010 Personal history of colonic polyps: Secondary | ICD-10-CM

## 2021-02-20 DIAGNOSIS — K219 Gastro-esophageal reflux disease without esophagitis: Secondary | ICD-10-CM

## 2021-02-20 DIAGNOSIS — F209 Schizophrenia, unspecified: Secondary | ICD-10-CM | POA: Diagnosis present

## 2021-02-20 DIAGNOSIS — Z20822 Contact with and (suspected) exposure to covid-19: Secondary | ICD-10-CM | POA: Diagnosis present

## 2021-02-20 DIAGNOSIS — K2970 Gastritis, unspecified, without bleeding: Secondary | ICD-10-CM | POA: Diagnosis present

## 2021-02-20 DIAGNOSIS — K2100 Gastro-esophageal reflux disease with esophagitis, without bleeding: Secondary | ICD-10-CM | POA: Diagnosis present

## 2021-02-20 DIAGNOSIS — E871 Hypo-osmolality and hyponatremia: Secondary | ICD-10-CM | POA: Diagnosis present

## 2021-02-20 DIAGNOSIS — R109 Unspecified abdominal pain: Secondary | ICD-10-CM | POA: Diagnosis not present

## 2021-02-20 DIAGNOSIS — E43 Unspecified severe protein-calorie malnutrition: Secondary | ICD-10-CM | POA: Insufficient documentation

## 2021-02-20 DIAGNOSIS — R739 Hyperglycemia, unspecified: Secondary | ICD-10-CM | POA: Diagnosis present

## 2021-02-20 DIAGNOSIS — N179 Acute kidney failure, unspecified: Secondary | ICD-10-CM | POA: Diagnosis present

## 2021-02-20 DIAGNOSIS — R933 Abnormal findings on diagnostic imaging of other parts of digestive tract: Secondary | ICD-10-CM | POA: Diagnosis not present

## 2021-02-20 DIAGNOSIS — K449 Diaphragmatic hernia without obstruction or gangrene: Secondary | ICD-10-CM | POA: Diagnosis present

## 2021-02-20 DIAGNOSIS — I248 Other forms of acute ischemic heart disease: Secondary | ICD-10-CM | POA: Diagnosis present

## 2021-02-20 DIAGNOSIS — E876 Hypokalemia: Secondary | ICD-10-CM | POA: Diagnosis present

## 2021-02-20 DIAGNOSIS — R9431 Abnormal electrocardiogram [ECG] [EKG]: Secondary | ICD-10-CM | POA: Diagnosis present

## 2021-02-20 DIAGNOSIS — E860 Dehydration: Secondary | ICD-10-CM | POA: Diagnosis present

## 2021-02-20 DIAGNOSIS — R000 Tachycardia, unspecified: Secondary | ICD-10-CM | POA: Diagnosis present

## 2021-02-20 DIAGNOSIS — R1013 Epigastric pain: Secondary | ICD-10-CM | POA: Diagnosis not present

## 2021-02-20 DIAGNOSIS — E785 Hyperlipidemia, unspecified: Secondary | ICD-10-CM | POA: Diagnosis present

## 2021-02-20 DIAGNOSIS — D72829 Elevated white blood cell count, unspecified: Secondary | ICD-10-CM

## 2021-02-20 DIAGNOSIS — I10 Essential (primary) hypertension: Secondary | ICD-10-CM | POA: Diagnosis present

## 2021-02-20 LAB — CBC WITH DIFFERENTIAL/PLATELET
Abs Immature Granulocytes: 0.28 10*3/uL — ABNORMAL HIGH (ref 0.00–0.07)
Basophils Absolute: 0 10*3/uL (ref 0.0–0.1)
Basophils Relative: 0 %
Eosinophils Absolute: 0 10*3/uL (ref 0.0–0.5)
Eosinophils Relative: 0 %
HCT: 36 % — ABNORMAL LOW (ref 39.0–52.0)
Hemoglobin: 12.2 g/dL — ABNORMAL LOW (ref 13.0–17.0)
Immature Granulocytes: 1 %
Lymphocytes Relative: 7 %
Lymphs Abs: 1.7 10*3/uL (ref 0.7–4.0)
MCH: 32.7 pg (ref 26.0–34.0)
MCHC: 33.9 g/dL (ref 30.0–36.0)
MCV: 96.5 fL (ref 80.0–100.0)
Monocytes Absolute: 1.8 10*3/uL — ABNORMAL HIGH (ref 0.1–1.0)
Monocytes Relative: 8 %
Neutro Abs: 19.2 10*3/uL — ABNORMAL HIGH (ref 1.7–7.7)
Neutrophils Relative %: 84 %
Platelets: 205 10*3/uL (ref 150–400)
RBC: 3.73 MIL/uL — ABNORMAL LOW (ref 4.22–5.81)
RDW: 12.2 % (ref 11.5–15.5)
WBC: 23 10*3/uL — ABNORMAL HIGH (ref 4.0–10.5)
nRBC: 0 % (ref 0.0–0.2)

## 2021-02-20 LAB — RESP PANEL BY RT-PCR (FLU A&B, COVID) ARPGX2
Influenza A by PCR: NEGATIVE
Influenza B by PCR: NEGATIVE
SARS Coronavirus 2 by RT PCR: NEGATIVE

## 2021-02-20 LAB — COMPREHENSIVE METABOLIC PANEL
ALT: 13 U/L (ref 0–44)
AST: 29 U/L (ref 15–41)
Albumin: 4.3 g/dL (ref 3.5–5.0)
Alkaline Phosphatase: 129 U/L — ABNORMAL HIGH (ref 38–126)
Anion gap: 16 — ABNORMAL HIGH (ref 5–15)
BUN: 43 mg/dL — ABNORMAL HIGH (ref 6–20)
CO2: 37 mmol/L — ABNORMAL HIGH (ref 22–32)
Calcium: 9.7 mg/dL (ref 8.9–10.3)
Chloride: 79 mmol/L — ABNORMAL LOW (ref 98–111)
Creatinine, Ser: 1.75 mg/dL — ABNORMAL HIGH (ref 0.61–1.24)
GFR, Estimated: 44 mL/min — ABNORMAL LOW (ref >60)
Glucose, Bld: 150 mg/dL — ABNORMAL HIGH (ref 70–99)
Potassium: 2.1 mmol/L — CL (ref 3.5–5.1)
Sodium: 132 mmol/L — ABNORMAL LOW (ref 135–145)
Total Bilirubin: 1 mg/dL (ref 0.3–1.2)
Total Protein: 8.2 g/dL — ABNORMAL HIGH (ref 6.5–8.1)

## 2021-02-20 LAB — URINALYSIS, ROUTINE W REFLEX MICROSCOPIC
Bilirubin Urine: NEGATIVE
Glucose, UA: 50 mg/dL — AB
Ketones, ur: NEGATIVE mg/dL
Leukocytes,Ua: NEGATIVE
Nitrite: NEGATIVE
Protein, ur: 30 mg/dL — AB
Specific Gravity, Urine: 1.019 (ref 1.005–1.030)
pH: 5 (ref 5.0–8.0)

## 2021-02-20 LAB — CBC
HCT: 40.9 % (ref 39.0–52.0)
Hemoglobin: 14.2 g/dL (ref 13.0–17.0)
MCH: 33.6 pg (ref 26.0–34.0)
MCHC: 34.7 g/dL (ref 30.0–36.0)
MCV: 96.7 fL (ref 80.0–100.0)
Platelets: 232 10*3/uL (ref 150–400)
RBC: 4.23 MIL/uL (ref 4.22–5.81)
RDW: 12.4 % (ref 11.5–15.5)
WBC: 36 10*3/uL — ABNORMAL HIGH (ref 4.0–10.5)
nRBC: 0 % (ref 0.0–0.2)

## 2021-02-20 LAB — LACTIC ACID, PLASMA: Lactic Acid, Venous: 4.5 mmol/L (ref 0.5–1.9)

## 2021-02-20 LAB — LIPASE, BLOOD: Lipase: 22 U/L (ref 11–51)

## 2021-02-20 LAB — MAGNESIUM: Magnesium: 1.1 mg/dL — ABNORMAL LOW (ref 1.7–2.4)

## 2021-02-20 MED ORDER — MAGNESIUM SULFATE 2 GM/50ML IV SOLN
2.0000 g | Freq: Once | INTRAVENOUS | Status: AC
  Administered 2021-02-21: 2 g via INTRAVENOUS
  Filled 2021-02-20: qty 50

## 2021-02-20 MED ORDER — POTASSIUM CHLORIDE 10 MEQ/100ML IV SOLN
10.0000 meq | INTRAVENOUS | Status: AC
  Administered 2021-02-20 – 2021-02-21 (×3): 10 meq via INTRAVENOUS
  Filled 2021-02-20 (×3): qty 100

## 2021-02-20 MED ORDER — LACTATED RINGERS IV BOLUS
1000.0000 mL | Freq: Once | INTRAVENOUS | Status: AC
  Administered 2021-02-20: 1000 mL via INTRAVENOUS

## 2021-02-20 MED ORDER — POTASSIUM CHLORIDE CRYS ER 20 MEQ PO TBCR
40.0000 meq | EXTENDED_RELEASE_TABLET | Freq: Once | ORAL | Status: AC
  Administered 2021-02-20: 40 meq via ORAL
  Filled 2021-02-20: qty 2

## 2021-02-20 MED ORDER — ENOXAPARIN SODIUM 40 MG/0.4ML IJ SOSY
40.0000 mg | PREFILLED_SYRINGE | INTRAMUSCULAR | Status: DC
  Administered 2021-02-20 – 2021-02-23 (×4): 40 mg via SUBCUTANEOUS
  Filled 2021-02-20 (×4): qty 0.4

## 2021-02-20 MED ORDER — PIPERACILLIN-TAZOBACTAM 3.375 G IVPB 30 MIN
3.3750 g | Freq: Once | INTRAVENOUS | Status: AC
  Administered 2021-02-20: 3.375 g via INTRAVENOUS
  Filled 2021-02-20: qty 50

## 2021-02-20 MED ORDER — METOCLOPRAMIDE HCL 5 MG/ML IJ SOLN
10.0000 mg | Freq: Once | INTRAMUSCULAR | Status: AC
  Administered 2021-02-20: 10 mg via INTRAVENOUS
  Filled 2021-02-20: qty 2

## 2021-02-20 MED ORDER — IOHEXOL 300 MG/ML  SOLN
75.0000 mL | Freq: Once | INTRAMUSCULAR | Status: AC | PRN
  Administered 2021-02-20: 75 mL via INTRAVENOUS

## 2021-02-20 MED ORDER — PANTOPRAZOLE SODIUM 40 MG IV SOLR
40.0000 mg | Freq: Once | INTRAVENOUS | Status: AC
  Administered 2021-02-20: 40 mg via INTRAVENOUS
  Filled 2021-02-20: qty 40

## 2021-02-20 NOTE — ED Provider Notes (Signed)
[facility] EMERGENCY DEPARTMENT Provider Note   CSN: [id number] Arrival date & time: 02/20/21  1940     History Chief Complaint  Patient presents with   Emesis    Danny Greene is a 60 y.o. male.  HPI 60 year old male presents with vomiting and generalized weakness and abdominal discomfort.  History is from patient.  He has been feeling poorly for about 3 days.  Is having nausea and vomiting.  The vomitus what ever he had just eaten or drank.  No hematemesis.  No diarrhea and has not had a bowel movement in a couple days but has been passing gas.  Has some nonspecific upper abdominal pain.  No chest pain.  He does feel little short of breath.  He denies any fevers.  It is difficult for him to rate the pain.  Past Medical History:  Diagnosis Date   Constipation    GERD (gastroesophageal reflux disease)    Hepatitis C antibody test positive    HCV RNA negative 11/2015 and negative 01/2018   History of kidney stones    Hypertension    Schizophrenia [facility])     Patient Active Problem List   Diagnosis Date Noted   Intractable nausea and vomiting 02/20/2021   History of colonic polyps 07/19/2017   Anemia 07/19/2017   Abnormal LFTs 07/19/2017   Positive colorectal cancer screening using Cologuard test 03/21/2017   Candida esophagitis ([place]) 02/01/2017   AKI (acute kidney injury) ([place]) 02/01/2017   Nausea & vomiting 01/28/2017   Colon cancer screening 10/30/2016   Hepatitis C antibody test positive 11/01/2015    Class: History of   Mucosal abnormality of esophagus    Dysphagia    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   Intractable vomiting with nausea 06/09/2015   Dehydration 69/62/9528   Periumbilical abdominal pain    Esophagitis 05/26/2015   Schizophrenia, unspecified type ([place]) 05/26/2015   Tobacco abuse 05/26/2015   Abnormal CT scan, esophagus    Hiatal hernia    Reflux esophagitis    Acute esophagitis    Abdominal pain 05/25/2015   Hypokalemia 05/25/2015    Past  Surgical History:  Procedure Laterality Date   BIOPSY  04/22/2017   Procedure: BIOPSY;  Surgeon: Daneil Dolin, MD;  Location: [facility] ENDO SUITE;  Service: Endoscopy;;  esophagus   CHOLECYSTECTOMY     COLONOSCOPY WITH PROPOFOL N/A 04/22/2017   Dr. Gala Romney: 5 semi-pedunculated polyps found in the sigmoid, splenic, ascending colon measuring 4 to 6 mm in size.  Tubular adenomas.  Next colonoscopy in 3 years.   ESOPHAGEAL DILATION N/A 05/26/2015   Procedure: ESOPHAGEAL DILATION;  Surgeon: Daneil Dolin, MD;  Location: [facility] ENDO SUITE;  Service: Endoscopy;  Laterality: N/A;   ESOPHAGEAL DILATION  09/21/2015   Procedure: ESOPHAGEAL DILATION;  Surgeon: Daneil Dolin, MD;  Location: [facility] ENDO SUITE;  Service: Endoscopy;;   ESOPHAGOGASTRODUODENOSCOPY N/A 05/26/2015   RMR: Severe ulcerative esophagitits ad described. Status post biopsy. Hiatal hernia.    ESOPHAGOGASTRODUODENOSCOPY N/A 09/21/2015   RMR: Abnormal distal esophagus query short segment Barretts status post biopsy. Severe esophagitis seen previously has resolved. Hiatal hernia. Status post maloney dilation prior to biopsy. No barrett's   ESOPHAGOGASTRODUODENOSCOPY (EGD) WITH PROPOFOL N/A 02/01/2017   Dr. Gala Romney: LA grade D esophagitis with overlying ribbonlike plaques, KOH few yeast. Marked friability of esophageal mucosa. Recommend EGD in 3 months, twice a day PPI.   ESOPHAGOGASTRODUODENOSCOPY (EGD) WITH PROPOFOL N/A 04/22/2017   Dr. Gala Romney: Medium sized hiatal hernia, distal exudative erosive/ulcerative  reflux esophagitis involving the distal 5 cm of the tubular office.  Biopsies negative for Barrett's esophagus.   KIDNEY STONE SURGERY         Family History  Problem Relation Age of Onset   Liver disease Neg Hx    Colon cancer Neg Hx     Social History   Tobacco Use   Smoking status: Every Day    Packs/day: 1.00    Years: 2.00    Pack years: 2.00    Types: Cigarettes   Smokeless tobacco: Never  Vaping Use   Vaping Use: Never used   Substance Use Topics   Alcohol use: No    Comment: states "liquor every chance I can"; denied 03/21/17   Drug use: No    Home Medications Prior to Admission medications   Medication Sig Start Date End Date Taking? Authorizing Provider  Cholecalciferol (VITAMIN D) 2000 units CAPS Take 1 capsule by mouth daily.    [provider]  clozapine (CLOZARIL) 200 MG tablet Take 200 mg by mouth at bedtime.     [provider]  fluvoxaMINE (LUVOX) 100 MG tablet Take 100 mg by mouth at bedtime. sleep    [provider]  gemfibrozil (LOPID) 600 MG tablet Take 600 mg by mouth 2 (two) times daily before a meal.    [provider]  pantoprazole (PROTONIX) 40 MG tablet Take 1 tablet (40 mg total) by mouth daily before breakfast. 03/23/20   Mahala Menghini, PA-C  vitamin B-12 (CYANOCOBALAMIN) 100 MCG tablet Take 100 mcg by mouth daily.    [provider]    Allergies    Patient has no known allergies.  Review of Systems   Review of Systems  Constitutional:  Positive for fatigue. Negative for fever.  Respiratory:  Positive for shortness of breath.   Gastrointestinal:  Positive for abdominal pain, constipation, nausea and vomiting. Negative for diarrhea.  All other systems reviewed and are negative.  Physical Exam Updated Vital Signs BP 121/80   Pulse (!) 108   Temp 98.2 F (36.8 C) (Oral)   Resp (!) 24   Ht 5\' 11"  (1.803 m)   Wt 72.6 kg   SpO2 (!) 89%   BMI 22.32 kg/m   Physical Exam Vitals and nursing note reviewed.  Constitutional:      Appearance: He is well-developed.  HENT:     Head: Normocephalic and atraumatic.     Right Ear: External ear normal.     Left Ear: External ear normal.     Nose: Nose normal.  Eyes:     General:        Right eye: No discharge.        Left eye: No discharge.  Cardiovascular:     Rate and Rhythm: Regular rhythm. Tachycardia present.     Heart sounds: Normal heart sounds.  Pulmonary:     Effort:  Pulmonary effort is normal.     Breath sounds: Normal breath sounds.  Abdominal:     Palpations: Abdomen is soft.     Tenderness: There is abdominal tenderness in the right upper quadrant and epigastric area.  Musculoskeletal:     Cervical back: Neck supple.  Skin:    General: Skin is warm and dry.  Neurological:     Mental Status: He is alert.  Psychiatric:        Mood and Affect: Mood is not anxious.    ED Results / Procedures / Treatments   Labs (all labs  ordered are listed, but only abnormal results are displayed) Labs Reviewed  COMPREHENSIVE METABOLIC PANEL - Abnormal; Notable for the following components:      Result Value   Sodium 132 (*)    Potassium 2.1 (*)    Chloride 79 (*)    CO2 37 (*)    Glucose, Bld 150 (*)    BUN 43 (*)    Creatinine, Ser 1.75 (*)    Total Protein 8.2 (*)    Alkaline Phosphatase 129 (*)    GFR, Estimated 44 (*)    Anion gap 16 (*)    All other components within normal limits  CBC - Abnormal; Notable for the following components:   WBC 36.0 (*)    All other components within normal limits  URINALYSIS, ROUTINE W REFLEX MICROSCOPIC - Abnormal; Notable for the following components:   APPearance HAZY (*)    Glucose, UA 50 (*)    Hgb urine dipstick SMALL (*)    Protein, ur 30 (*)    Bacteria, UA RARE (*)    All other components within normal limits  LACTIC ACID, PLASMA - Abnormal; Notable for the following components:   Lactic Acid, Venous 4.5 (*)    All other components within normal limits  MAGNESIUM - Abnormal; Notable for the following components:   Magnesium 1.1 (*)    All other components within normal limits  CBC WITH DIFFERENTIAL/PLATELET - Abnormal; Notable for the following components:   WBC 23.0 (*)    RBC 3.73 (*)    Hemoglobin 12.2 (*)    HCT 36.0 (*)    Neutro Abs 19.2 (*)    Monocytes Absolute 1.8 (*)    Abs Immature Granulocytes 0.28 (*)    All other components within normal limits  RESP PANEL BY RT-PCR (FLU A&B,  COVID) ARPGX2  CULTURE, BLOOD (ROUTINE X 2)  CULTURE, BLOOD (ROUTINE X 2)  LIPASE, BLOOD  LACTIC ACID, PLASMA  HIV ANTIBODY (ROUTINE TESTING W REFLEX)  COMPREHENSIVE METABOLIC PANEL  CBC  PROTIME-INR  APTT  MAGNESIUM  PHOSPHORUS    EKG EKG Interpretation  Date/Time:  Monday February 20 2021 19:57:10 EDT Ventricular Rate:  141 PR Interval:  146 QRS Duration: 78 QT Interval:  346 QTC Calculation: 529 R Axis:   -47 Text Interpretation: Sinus tachycardia Left anterior fascicular block Possible Inferior infarct , age undetermined ST & T wave abnormality, consider lateral ischemia  similar to 2018 Confirmed by Sherwood Gambler [id number]) on 02/20/2021 8:14:36 PM  Radiology CT ABDOMEN PELVIS W CONTRAST  Result Date: 02/20/2021 CLINICAL DATA:  Nausea and vomiting. EXAM: CT ABDOMEN AND PELVIS WITH CONTRAST TECHNIQUE: Multidetector CT imaging of the abdomen and pelvis was performed using the standard protocol following bolus administration of intravenous contrast. CONTRAST:  24mL OMNIPAQUE IOHEXOL 300 MG/ML  SOLN COMPARISON:  CT 01/28/2017 FINDINGS: Lower chest: Moderate hiatal hernia with wall thickening of the distal esophagus. No pleural effusion or focal airspace disease. Hepatobiliary: Diffusely decreased hepatic density typical of steatosis. No focal hepatic abnormality. Gallbladder physiologically distended, no calcified stone. No biliary dilatation. Pancreas: No ductal dilatation or inflammation. Spleen: Normal in size without focal abnormality. Adrenals/Urinary Tract: Normal adrenal glands. No hydronephrosis or perinephric edema. Homogeneous renal enhancement with symmetric excretion on delayed phase imaging. Subcentimeter low-density in the posterior lower right kidney is too small to characterize but likely cyst. No renal calculi. Urinary bladder is physiologically distended without wall thickening. Stomach/Bowel: Moderate hiatal hernia with wall thickening of the distal esophagus. There is wall  thickening of the gastric cardia. Occasional fluid-filled small bowel without obstruction or inflammation. Normal appendix. Small volume of colonic stool. Sigmoid colon is redundant. Areas of submucosal fatty infiltration in the colon again seen. There is no colonic inflammation. Vascular/Lymphatic: Mild aortic atherosclerosis. No aortic aneurysm. Patent portal vein. Circumaortic left renal vein. No enlarged lymph nodes in the abdomen or pelvis. Reproductive: Prominent prostate gland spans 5 cm transverse. Other: Fat containing left inguinal hernia.  No ascites or free air. Musculoskeletal: Chronic L1 superior endplate compression fracture. Bone islands in the right proximal femur again seen. IMPRESSION: 1. Moderate hiatal hernia with wall thickening of the distal esophagus and gastric cardia, suggesting reflux or esophagitis/gastritis. 2. Hepatic steatosis. 3. Small fat containing left inguinal hernia. Aortic Atherosclerosis (ICD10-I70.0). Electronically Signed   By: Keith Rake M.D.   On: 02/20/2021 22:14   DG Chest Portable 1 View  Result Date: 02/20/2021 CLINICAL DATA:  Vomiting.  Dyspnea. EXAM: PORTABLE CHEST 1 VIEW COMPARISON:  01/28/2017 FINDINGS: Heart size normal. No pleural effusion or edema. No airspace opacities identified. Visualized osseous structures are intact. IMPRESSION: No acute cardiopulmonary abnormalities. Electronically Signed   By: Kerby Moors M.D.   On: 02/20/2021 20:53    Procedures .Critical Care  Date/Time: 02/20/2021 11:28 PM Performed by: Sherwood Gambler, MD Authorized by: Sherwood Gambler, MD   Critical care provider statement:    Critical care time (minutes):  45   Critical care time was exclusive of:  Separately billable procedures and treating other patients   Critical care was necessary to treat or prevent imminent or life-threatening deterioration of the following conditions:  Metabolic crisis, sepsis and renal failure   Critical care was time spent  personally by me on the following activities:  Discussions with consultants, evaluation of patient's response to treatment, examination of patient, ordering and performing treatments and interventions, ordering and review of laboratory studies, ordering and review of radiographic studies, pulse oximetry, re-evaluation of patient's condition, obtaining history from patient or surrogate and review of old charts   Medications Ordered in ED Medications  potassium chloride 10 mEq in 100 mL IVPB (10 mEq Intravenous New Bag/Given 02/20/21 2243)  enoxaparin (LOVENOX) injection 40 mg (has no administration in time range)  magnesium sulfate IVPB 2 g 50 mL (has no administration in time range)  metoCLOPramide (REGLAN) injection 10 mg (10 mg Intravenous Given 02/20/21 2040)  lactated ringers bolus 1,000 mL (0 mLs Intravenous Stopped 02/20/21 2200)  pantoprazole (PROTONIX) injection 40 mg (40 mg Intravenous Given 02/20/21 2040)  potassium chloride SA (KLOR-CON) CR tablet 40 mEq (40 mEq Oral Given 02/20/21 2204)  piperacillin-tazobactam (ZOSYN) IVPB 3.375 g (0 g Intravenous Stopped 02/20/21 2241)  lactated ringers bolus 1,000 mL (1,000 mLs Intravenous New Bag/Given 02/20/21 2241)  iohexol (OMNIPAQUE) 300 MG/ML solution 75 mL (75 mLs Intravenous Contrast Given 02/20/21 2150)    ED Course  I have reviewed the triage vital signs and the nursing notes.  Pertinent labs & imaging results that were available during my care of the patient were reviewed by me and considered in my medical decision making (see chart for details).    MDM Rules/Calculators/A&P                          Patient presents with tachycardia and borderline blood pressures.  While he is not ill-appearing, he does appear dehydrated and was given IV fluids.  Work-up initially is significant for very significant leukocytosis.  Given this in addition  to the lactate coming back at 4.5, I gave him a dose of antibiotics while waiting on a CT scan.  He is  afebrile.  The CT is reassuring from an infectious standpoint.  He was given Protonix for what appears to be esophagitis/gastritis.  Overall I think a lot of his lab abnormalities are related to the vomiting and dehydration.  His potassium and magnesium will be repleted.  He will need admission for further cardiac monitoring and replacement of electrolytes and fluids.  Dr. Bryn Gulling to admit. Final Clinical Impression(s) / ED Diagnoses Final diagnoses:  Acute kidney injury ([place])  Hypokalemia  Hypomagnesemia  Lactic acidosis    Rx / DC Orders ED Discharge Orders     None        Sherwood Gambler, MD 02/20/21 2329

## 2021-02-20 NOTE — ED Triage Notes (Addendum)
Pt from [facility] with "esophageal flare". Caregiver states pt unable to eat or drink. Keeps vomiting and is very "weak" and has multiple falls in the last couple of days.

## 2021-02-20 NOTE — ED Notes (Signed)
Date and time results received: 02/20/21 2140 (use smartphrase ".now" to insert current time)  Test: potassium Critical Value: 2.1  Name of Provider Notified: Dr Regenia Skeeter  Orders Received? Or Actions Taken?: Actions Taken: no orders received

## 2021-02-20 NOTE — ED Notes (Signed)
Date and time results received: 02/20/21 1006   Test: Lactic Critical Value: 4.5  Name of Provider Notified: Verta Ellen   Orders Received? Or Actions Taken?: NA

## 2021-02-20 NOTE — H&P (Signed)
History and Physical  Danny Greene VPX:[id number] DOB: 08-05-1961 DOA: 02/20/2021  Referring physician: Sherwood Gambler, MD PCP: Lauretta Grill, NP  Patient coming from: [place] per ED physician  Chief Complaint: Nausea and vomiting  HPI: Danny Greene is a 60 y.o. male with medical history significant for schizophrenia, GERD, hyperlipidemia who presents to the emergency department due to generalized weakness and abdominal discomfort.  Patient was unable to provide history, history was obtained from ED physician and ED medical record.  Per report, patient has been having nausea and vomiting for about 3 days and has not been able to keep anything down.  Vomitus was nonbloody, patient presents with nonspecific abdominal pain which he was unable to rate.  No reported fever, diarrhea chest pain, fever or chills.  ED Course:  In the emergency department, he was tachypneic and tachycardic, BP was 142/116.  Work-up in the ED shows leukocytosis, normocytic anemia, lactic acidosis, hypokalemia, hyperglycemia, BUN/creatinine 43/1.75 (no recent prior labs for comparison--last creatinine on record was 3 years ago and it was 0.9-1.0). CT abdomen and pelvis with contrast showed moderate hiatal hernia with wall thickening of the distal esophagus and gastric cardia, suggesting reflux or esophagitis/gastritis Chest x-ray showed no acute cardiopulmonary abnormalities IV hydration was provided, patient was empirically started on IV antibiotics, Protonix was given.  And potassium was replenished.  Hospitalist was asked to admit patient for further evaluation and management.   Review of Systems: Constitutional: Negative for chills and fever.  HENT: Negative for ear pain and sore throat.   Eyes: Negative for pain and visual disturbance.  Respiratory: Negative for cough, chest tightness and shortness of breath.   Cardiovascular: Negative for chest pain and palpitations.  Gastrointestinal: Positive for  nausea, vomiting, abdominal pain and constipation.   Endocrine: Negative for polyphagia and polyuria.  Genitourinary: Negative for decreased urine volume, dysuria, enuresis Musculoskeletal: Negative for arthralgias and back pain.  Skin: Negative for color change and rash.  Allergic/Immunologic: Negative for immunocompromised state.  Neurological: Negative for tremors, syncope, speech difficulty Hematological: Does not bruise/bleed easily.  All other systems reviewed and are negative   Past Medical History:  Diagnosis Date   Constipation    GERD (gastroesophageal reflux disease)    Hepatitis C antibody test positive    HCV RNA negative 11/2015 and negative 01/2018   History of kidney stones    Hypertension    Schizophrenia [place])    Past Surgical History:  Procedure Laterality Date   BIOPSY  04/22/2017   Procedure: BIOPSY;  Surgeon: Daneil Dolin, MD;  Location: [facility];  Service: Endoscopy;;  esophagus   CHOLECYSTECTOMY     COLONOSCOPY WITH PROPOFOL N/A 04/22/2017   Dr. Gala Romney: 5 semi-pedunculated polyps found in the sigmoid, splenic, ascending colon measuring 4 to 6 mm in size.  Tubular adenomas.  Next colonoscopy in 3 years.   ESOPHAGEAL DILATION N/A 05/26/2015   Procedure: ESOPHAGEAL DILATION;  Surgeon: Daneil Dolin, MD;  Location: [facility];  Service: Endoscopy;  Laterality: N/A;   ESOPHAGEAL DILATION  09/21/2015   Procedure: ESOPHAGEAL DILATION;  Surgeon: Daneil Dolin, MD;  Location: [facility];  Service: Endoscopy;;   ESOPHAGOGASTRODUODENOSCOPY N/A 05/26/2015   RMR: Severe ulcerative esophagitits ad described. Status post biopsy. Hiatal hernia.    ESOPHAGOGASTRODUODENOSCOPY N/A 09/21/2015   RMR: Abnormal distal esophagus query short segment Barretts status post biopsy. Severe esophagitis seen previously has resolved. Hiatal hernia. Status post maloney dilation prior to biopsy. No barrett's  ESOPHAGOGASTRODUODENOSCOPY (EGD) WITH PROPOFOL N/A 02/01/2017   Dr. Gala Romney:  LA grade D esophagitis with overlying ribbonlike plaques, KOH few yeast. Marked friability of esophageal mucosa. Recommend EGD in 3 months, twice a day PPI.   ESOPHAGOGASTRODUODENOSCOPY (EGD) WITH PROPOFOL N/A 04/22/2017   Dr. Gala Romney: Medium sized hiatal hernia, distal exudative erosive/ulcerative reflux esophagitis involving the distal 5 cm of the tubular office.  Biopsies negative for Barrett's esophagus.   KIDNEY STONE SURGERY      Social History:  reports that he has been smoking cigarettes. He has a 2.00 pack-year smoking history. He has never used smokeless tobacco. He reports that he does not drink alcohol and does not use drugs.   No Known Allergies  Family History  Problem Relation Age of Onset   Liver disease Neg Hx    Colon cancer Neg Hx      Prior to Admission medications   Medication Sig Start Date End Date Taking? Authorizing Provider  Cholecalciferol (VITAMIN D) 2000 units CAPS Take 1 capsule by mouth daily.    [provider]  clozapine (CLOZARIL) 200 MG tablet Take 200 mg by mouth at bedtime.     [provider]  fluvoxaMINE (LUVOX) 100 MG tablet Take 100 mg by mouth at bedtime. sleep    [provider]  gemfibrozil (LOPID) 600 MG tablet Take 600 mg by mouth 2 (two) times daily before a meal.    [provider]  pantoprazole (PROTONIX) 40 MG tablet Take 1 tablet (40 mg total) by mouth daily before breakfast. 03/23/20   Mahala Menghini, PA-C  vitamin B-12 (CYANOCOBALAMIN) 100 MCG tablet Take 100 mcg by mouth daily.    [provider]    Physical Exam: BP 105/79   Pulse (!) 106   Temp 98.2 F (36.8 C) (Oral)   Resp (!) 25   Ht 5\' 11"  (1.803 m)   Wt 72.6 kg   SpO2 (!) 89%   BMI 22.32 kg/m   General: 60 y.o. year-old male well developed well nourished in no acute distress.  Alert and oriented x3. HEENT: Dry mucous membrane.  Dry mucous membrane.  NCAT, EOMI Neck: Supple, trachea medial Cardiovascular: Tachycardia.   Regular rate and rhythm with no rubs or gallops.  No thyromegaly or JVD noted.  No lower extremity edema. 2/4 pulses in all 4 extremities. Respiratory: Tachypnea.  Clear to auscultation with no wheezes or rales. Good inspiratory effort. Abdomen: Soft, tender to palpation of abdomen without guarding.  Nondistended with normal bowel sounds x4 quadrants. Muskuloskeletal: No cyanosis, clubbing or edema noted bilaterally Neuro: CN II-XII intact, strength 5/5 x 4, sensation, reflexes intact Skin: No ulcerative lesions noted or rashes Psychiatry: Mood is appropriate for condition and setting          Labs on Admission:  Basic Metabolic Panel: Recent Labs  Lab 02/20/21 2016 02/20/21 2215  NA 132*  --   K 2.1*  --   CL 79*  --   CO2 37*  --   GLUCOSE 150*  --   BUN 43*  --   CREATININE 1.75*  --   CALCIUM 9.7  --   MG  --  1.1*   Liver Function Tests: Recent Labs  Lab 02/20/21 2016  AST 29  ALT 13  ALKPHOS 129*  BILITOT 1.0  PROT 8.2*  ALBUMIN 4.3   Recent Labs  Lab 02/20/21 2016  LIPASE 22   No results for input(s): AMMONIA in the last 168 hours. CBC: Recent  Labs  Lab 02/20/21 2016 02/20/21 2215  WBC 36.0* 23.0*  NEUTROABS  --  19.2*  HGB 14.2 12.2*  HCT 40.9 36.0*  MCV 96.7 96.5  PLT 232 205   Cardiac Enzymes: No results for input(s): CKTOTAL, CKMB, CKMBINDEX, TROPONINI in the last 168 hours.  BNP (last 3 results) No results for input(s): BNP in the last 8760 hours.  ProBNP (last 3 results) No results for input(s): PROBNP in the last 8760 hours.  CBG: No results for input(s): GLUCAP in the last 168 hours.  Radiological Exams on Admission: CT ABDOMEN PELVIS W CONTRAST  Result Date: 02/20/2021 CLINICAL DATA:  Nausea and vomiting. EXAM: CT ABDOMEN AND PELVIS WITH CONTRAST TECHNIQUE: Multidetector CT imaging of the abdomen and pelvis was performed using the standard protocol following bolus administration of intravenous contrast. CONTRAST:  73mL OMNIPAQUE  IOHEXOL 300 MG/ML  SOLN COMPARISON:  CT 01/28/2017 FINDINGS: Lower chest: Moderate hiatal hernia with wall thickening of the distal esophagus. No pleural effusion or focal airspace disease. Hepatobiliary: Diffusely decreased hepatic density typical of steatosis. No focal hepatic abnormality. Gallbladder physiologically distended, no calcified stone. No biliary dilatation. Pancreas: No ductal dilatation or inflammation. Spleen: Normal in size without focal abnormality. Adrenals/Urinary Tract: Normal adrenal glands. No hydronephrosis or perinephric edema. Homogeneous renal enhancement with symmetric excretion on delayed phase imaging. Subcentimeter low-density in the posterior lower right kidney is too small to characterize but likely cyst. No renal calculi. Urinary bladder is physiologically distended without wall thickening. Stomach/Bowel: Moderate hiatal hernia with wall thickening of the distal esophagus. There is wall thickening of the gastric cardia. Occasional fluid-filled small bowel without obstruction or inflammation. Normal appendix. Small volume of colonic stool. Sigmoid colon is redundant. Areas of submucosal fatty infiltration in the colon again seen. There is no colonic inflammation. Vascular/Lymphatic: Mild aortic atherosclerosis. No aortic aneurysm. Patent portal vein. Circumaortic left renal vein. No enlarged lymph nodes in the abdomen or pelvis. Reproductive: Prominent prostate gland spans 5 cm transverse. Other: Fat containing left inguinal hernia.  No ascites or free air. Musculoskeletal: Chronic L1 superior endplate compression fracture. Bone islands in the right proximal femur again seen. IMPRESSION: 1. Moderate hiatal hernia with wall thickening of the distal esophagus and gastric cardia, suggesting reflux or esophagitis/gastritis. 2. Hepatic steatosis. 3. Small fat containing left inguinal hernia. Aortic Atherosclerosis (ICD10-I70.0). Electronically Signed   By: Keith Rake M.D.   On:  02/20/2021 22:14   DG Chest Portable 1 View  Result Date: 02/20/2021 CLINICAL DATA:  Vomiting.  Dyspnea. EXAM: PORTABLE CHEST 1 VIEW COMPARISON:  01/28/2017 FINDINGS: Heart size normal. No pleural effusion or edema. No airspace opacities identified. Visualized osseous structures are intact. IMPRESSION: No acute cardiopulmonary abnormalities. Electronically Signed   By: Kerby Moors M.D.   On: 02/20/2021 20:53    EKG: I independently viewed the EKG done and my findings are as followed: Sinus tachycardia at a rate of 141 bpm with prolonged QTc (529 ms)  Assessment/Plan Present on Admission:  Intractable nausea and vomiting  Abdominal pain  AKI (acute kidney injury) ([place])  Hypokalemia  Dehydration  Principal Problem:   Abdominal pain Active Problems:   Hypokalemia   Dehydration   GERD (gastroesophageal reflux disease)   AKI (acute kidney injury) ([facility])   Intractable nausea and vomiting   Leukocytosis   Lactic acidosis   Hyperglycemia   Prolonged QT interval  Intractable nausea and vomiting possibly secondary to dyspepsia Abdominal pain Continue IV NS at 75 mLs/Hr Continue on Protonix Continue IV Compazine  p.r.n. Continue clear liquid diet with plan to advanced diet as tolerated Clozapine will be held at this time due to nausea and vomiting Gemfibrozil will be held at this time due to abdominal pain  GERD with suspicion for esophagitis/gastritis CT abdomen and pelvis with contrast showed moderate hiatal hernia with wall thickening of the distal esophagus and gastric cardia, suggesting reflux or esophagitis/gastritis Continue Protonix  Acute kidney injury/dehydration BUN/creatinine 43/1.75 (no recent prior labs for comparison--last creatinine on record was 3 years ago and it was 0.9-1.0) Continue IV hydration Renally adjust medications, avoid nephrotoxic agents/dehydration/hypotension  Lactic acidosis possibly due to dehydration-resolved Lactic acid 4.5  >  1.9  Leukocytosis, hyperglycemia possibly secondary to reactive process WBC 23, CBG 150 Patient was empirically treated with IV antibiotics.  Patient is afebrile and does not appear to have any obvious source of infection at this time.  Procalcitonin will be checked prior to continuing with antibiotics  Schizophrenia Clozapine will be held at this time due to nausea and vomiting  Hyperlipidemia Gemfibrozil will be held at this time due to patient will be held at this time due to abdominal pain   DVT prophylaxis: Lovenox  Code Status: Full code  Family Communication: None at bedside  Disposition Plan:  Patient is from:                        home Anticipated DC to:                   SNF or family members home Anticipated DC date:               2-3 days Anticipated DC barriers:         none  Consults called: None  Admission status: Inpatient    Bernadette Hoit MD [facility]  02/21/2021, 3:02 AM

## 2021-02-21 ENCOUNTER — Inpatient Hospital Stay (HOSPITAL_COMMUNITY): Payer: Medicaid Other

## 2021-02-21 DIAGNOSIS — R739 Hyperglycemia, unspecified: Secondary | ICD-10-CM

## 2021-02-21 DIAGNOSIS — R6510 Systemic inflammatory response syndrome (SIRS) of non-infectious origin without acute organ dysfunction: Secondary | ICD-10-CM

## 2021-02-21 DIAGNOSIS — R9431 Abnormal electrocardiogram [ECG] [EKG]: Secondary | ICD-10-CM

## 2021-02-21 DIAGNOSIS — D72829 Elevated white blood cell count, unspecified: Secondary | ICD-10-CM

## 2021-02-21 DIAGNOSIS — E872 Acidosis, unspecified: Secondary | ICD-10-CM

## 2021-02-21 DIAGNOSIS — R933 Abnormal findings on diagnostic imaging of other parts of digestive tract: Secondary | ICD-10-CM

## 2021-02-21 LAB — PHOSPHORUS: Phosphorus: 2.9 mg/dL (ref 2.5–4.6)

## 2021-02-21 LAB — COMPREHENSIVE METABOLIC PANEL
ALT: 11 U/L (ref 0–44)
AST: 21 U/L (ref 15–41)
Albumin: 3.2 g/dL — ABNORMAL LOW (ref 3.5–5.0)
Alkaline Phosphatase: 94 U/L (ref 38–126)
Anion gap: 12 (ref 5–15)
BUN: 34 mg/dL — ABNORMAL HIGH (ref 6–20)
CO2: 34 mmol/L — ABNORMAL HIGH (ref 22–32)
Calcium: 8.7 mg/dL — ABNORMAL LOW (ref 8.9–10.3)
Chloride: 87 mmol/L — ABNORMAL LOW (ref 98–111)
Creatinine, Ser: 1.11 mg/dL (ref 0.61–1.24)
GFR, Estimated: >60 mL/min (ref >60)
Glucose, Bld: 129 mg/dL — ABNORMAL HIGH (ref 70–99)
Potassium: 2.6 mmol/L — CL (ref 3.5–5.1)
Sodium: 133 mmol/L — ABNORMAL LOW (ref 135–145)
Total Bilirubin: 0.7 mg/dL (ref 0.3–1.2)
Total Protein: 6.1 g/dL — ABNORMAL LOW (ref 6.5–8.1)

## 2021-02-21 LAB — CBC
HCT: 32.2 % — ABNORMAL LOW (ref 39.0–52.0)
Hemoglobin: 11.1 g/dL — ABNORMAL LOW (ref 13.0–17.0)
MCH: 33 pg (ref 26.0–34.0)
MCHC: 34.5 g/dL (ref 30.0–36.0)
MCV: 95.8 fL (ref 80.0–100.0)
Platelets: 193 10*3/uL (ref 150–400)
RBC: 3.36 MIL/uL — ABNORMAL LOW (ref 4.22–5.81)
RDW: 12.1 % (ref 11.5–15.5)
WBC: 21.3 10*3/uL — ABNORMAL HIGH (ref 4.0–10.5)
nRBC: 0 % (ref 0.0–0.2)

## 2021-02-21 LAB — LACTIC ACID, PLASMA: Lactic Acid, Venous: 1.9 mmol/L (ref 0.5–1.9)

## 2021-02-21 LAB — MAGNESIUM: Magnesium: 1.2 mg/dL — ABNORMAL LOW (ref 1.7–2.4)

## 2021-02-21 LAB — D-DIMER, QUANTITATIVE: D-Dimer, Quant: 1.89 ug/mL-FEU — ABNORMAL HIGH (ref 0.00–0.50)

## 2021-02-21 LAB — PROTIME-INR
INR: 1.2 (ref 0.8–1.2)
Prothrombin Time: 14.9 seconds (ref 11.4–15.2)

## 2021-02-21 LAB — TROPONIN I (HIGH SENSITIVITY)
Troponin I (High Sensitivity): 45 ng/L — ABNORMAL HIGH (ref <18)
Troponin I (High Sensitivity): 37 ng/L — ABNORMAL HIGH (ref <18)

## 2021-02-21 LAB — PROCALCITONIN: Procalcitonin: <0.1 ng/mL

## 2021-02-21 LAB — HIV ANTIBODY (ROUTINE TESTING W REFLEX): HIV Screen 4th Generation wRfx: NONREACTIVE

## 2021-02-21 LAB — APTT: aPTT: 33 seconds (ref 24–36)

## 2021-02-21 MED ORDER — IOHEXOL 350 MG/ML SOLN
75.0000 mL | Freq: Once | INTRAVENOUS | Status: AC | PRN
  Administered 2021-02-21: 100 mL via INTRAVENOUS

## 2021-02-21 MED ORDER — LACTATED RINGERS IV BOLUS
1000.0000 mL | Freq: Once | INTRAVENOUS | Status: AC
  Administered 2021-02-21: 1000 mL via INTRAVENOUS

## 2021-02-21 MED ORDER — MAGNESIUM SULFATE 2 GM/50ML IV SOLN
2.0000 g | Freq: Once | INTRAVENOUS | Status: AC
  Administered 2021-02-21: 2 g via INTRAVENOUS
  Filled 2021-02-21: qty 50

## 2021-02-21 MED ORDER — PANTOPRAZOLE SODIUM 40 MG IV SOLR
40.0000 mg | Freq: Two times a day (BID) | INTRAVENOUS | Status: DC
  Administered 2021-02-21 – 2021-02-23 (×5): 40 mg via INTRAVENOUS
  Filled 2021-02-21 (×5): qty 40

## 2021-02-21 MED ORDER — PROCHLORPERAZINE EDISYLATE 10 MG/2ML IJ SOLN
10.0000 mg | Freq: Four times a day (QID) | INTRAMUSCULAR | Status: DC | PRN
  Administered 2021-02-21: 10 mg via INTRAVENOUS
  Filled 2021-02-21: qty 2

## 2021-02-21 MED ORDER — POTASSIUM CHLORIDE 10 MEQ/100ML IV SOLN
10.0000 meq | INTRAVENOUS | Status: AC
  Administered 2021-02-21 (×2): 10 meq via INTRAVENOUS
  Filled 2021-02-21 (×2): qty 100

## 2021-02-21 MED ORDER — POTASSIUM CHLORIDE CRYS ER 20 MEQ PO TBCR
40.0000 meq | EXTENDED_RELEASE_TABLET | Freq: Once | ORAL | Status: AC
  Administered 2021-02-21: 40 meq via ORAL
  Filled 2021-02-21: qty 2

## 2021-02-21 MED ORDER — POTASSIUM CHLORIDE IN NACL 40-0.9 MEQ/L-% IV SOLN
INTRAVENOUS | Status: DC
  Administered 2021-02-21: 100 mL/h via INTRAVENOUS
  Filled 2021-02-21 (×5): qty 1000

## 2021-02-21 MED ORDER — PIPERACILLIN-TAZOBACTAM 3.375 G IVPB
3.3750 g | Freq: Three times a day (TID) | INTRAVENOUS | Status: AC
  Administered 2021-02-21 – 2021-02-23 (×7): 3.375 g via INTRAVENOUS
  Filled 2021-02-21 (×6): qty 50

## 2021-02-21 MED ORDER — SODIUM CHLORIDE 0.9 % IV SOLN: Freq: Once | INTRAVENOUS | Status: AC

## 2021-02-21 MED ORDER — MORPHINE SULFATE (PF) 2 MG/ML IV SOLN: 2.0000 mg | INTRAVENOUS | Status: DC | PRN

## 2021-02-21 NOTE — ED Notes (Signed)
Date and time results received: 02/21/21 0550 (use smartphrase ".now" to insert current time)  Test: potassium  Critical Value: 2.6  Name of Provider Notified: Dr Josephine Cables  Orders Received? Or Actions Taken?: Actions Taken: no orders received

## 2021-02-21 NOTE — ED Notes (Signed)
Pt's clothes are wet and smell like urine. Pt refuses to change soiled clothes x 2. Attempted by nurse and nurse tech.

## 2021-02-21 NOTE — ED Notes (Signed)
Pt urinated in floor and his pants were saturated. We took his pants and underwear off and placed in belongings bag with shoes. Pt is now still in his t-shirt and hospital gown. Pt does not understand the concept of using a call light or to push the red button when he needs help.

## 2021-02-21 NOTE — ED Notes (Signed)
Pt pulled out third iv.

## 2021-02-21 NOTE — Consult Note (Signed)
$'@LOGO'O$ @   Referring Provider: Dr. Carles Collet Primary Care Physician:  Lauretta Grill, NP Primary Gastroenterologist:  Dr. Gala Romney  Date of Admission: 02/20/21 Date of Consultation: 02/21/21  Reason for Consultation:  Esophagitis  HPI:  Danny Greene is a 60 y.o. year old male with history significant for GERD with documented ulcerative esophagitis on prior EGDs along with Candida esophagitis, constipation, HTN, schizophrenia who presented from [facility] with nausea, vomiting, upper abdominal discomfort, and generalized weakness for about 3 days.  ED Course:  In the emergency department, he was tachypneic and tachycardic, BP was 142/116. Labs remarkable for WBC elevated at 36.0, sodium low at 132, potassium low at 2.1, glucose elevated at 150, creatinine elevated at 1.75 with BUN 43, alk phos slightly elevated at 129, lipase normal.  Lactic acid 4.5.  Magnesium low at 1.1.  Blood cultures drawn. CT A/P with contrast showed moderate hiatal hernia with wall thickening of the distal esophagus and gastric cardia, suggesting reflux or esophagitis/gastritis.  Chest x-ray showed no acute cardiopulmonary abnormalities IV hydration was provided, patient was empirically started on IV antibiotics, Protonix, IV Compazine.  And potassium was replenished.  Today, WBC 21.3, hemoglobin 11.1 (down from 14.2 on admission), potassium is still low at 2.6.  Sodium 133.  Alk phos normalized.  INR 1.2.  Today:  Patient reports he presented to the emergency room due to 3 days of postprandial nausea and vomiting with associated upper abdominal pain which is described as a burning/cramping sensation.  Abdominal pain comes on just prior to vomiting.  Denies hematemesis, BRBPR, or melena.  Admits to having a lot of reflux symptoms lately.  He is taking pantoprazole 40 mg, but cannot tell me if he is taking this once or twice a day.  Looks like last prescription was for once daily.  Denies dysphagia.   Denies NSAIDs.  Denies changes in bowel habits, diarrhea, constipation.  Since presenting to the emergency room, he has not had any further nausea or vomiting.  Upper abdominal pain is very minimal.   Past Medical History:  Diagnosis Date   Constipation    GERD (gastroesophageal reflux disease)    Hepatitis C antibody test positive    HCV RNA negative 11/2015 and negative 01/2018   History of kidney stones    Hypertension    Schizophrenia [facility])     Past Surgical History:  Procedure Laterality Date   BIOPSY  04/22/2017   Procedure: BIOPSY;  Surgeon: Daneil Dolin, MD;  Location: [facility];  Service: Endoscopy;;  esophagus   CHOLECYSTECTOMY     COLONOSCOPY WITH PROPOFOL N/A 04/22/2017   Dr. Gala Romney: 5 semi-pedunculated polyps found in the sigmoid, splenic, ascending colon measuring 4 to 6 mm in size.  Tubular adenomas.  Next colonoscopy in 3 years.   ESOPHAGEAL DILATION N/A 05/26/2015   Procedure: ESOPHAGEAL DILATION;  Surgeon: Daneil Dolin, MD;  Location: [facility];  Service: Endoscopy;  Laterality: N/A;   ESOPHAGEAL DILATION  09/21/2015   Procedure: ESOPHAGEAL DILATION;  Surgeon: Daneil Dolin, MD;  Location: [facility];  Service: Endoscopy;;   ESOPHAGOGASTRODUODENOSCOPY N/A 05/26/2015   RMR: Severe ulcerative esophagitits ad described. Status post biopsy. Hiatal hernia.    ESOPHAGOGASTRODUODENOSCOPY N/A 09/21/2015   RMR: Abnormal distal esophagus query short segment Barretts status post biopsy. Severe esophagitis seen previously has resolved. Hiatal hernia. Status post maloney dilation prior to biopsy. No barrett's   ESOPHAGOGASTRODUODENOSCOPY (EGD) WITH PROPOFOL N/A 02/01/2017   Dr. Gala Romney: LA  grade D esophagitis with overlying ribbonlike plaques, KOH few yeast. Marked friability of esophageal mucosa. Recommend EGD in 3 months, twice a day PPI.   ESOPHAGOGASTRODUODENOSCOPY (EGD) WITH PROPOFOL N/A 04/22/2017   Dr. Gala Romney: Medium sized hiatal hernia, distal exudative  erosive/ulcerative reflux esophagitis involving the distal 5 cm of the tubular office.  Biopsies negative for Barrett's esophagus.   KIDNEY STONE SURGERY      Prior to Admission medications   Medication Sig Start Date End Date Taking? Authorizing Provider  Cholecalciferol (VITAMIN D) 2000 units CAPS Take 1 capsule by mouth daily.    [provider]  clozapine (CLOZARIL) 200 MG tablet Take 200 mg by mouth at bedtime.     [provider]  fluvoxaMINE (LUVOX) 100 MG tablet Take 100 mg by mouth at bedtime. sleep    [provider]  gemfibrozil (LOPID) 600 MG tablet Take 600 mg by mouth 2 (two) times daily before a meal.    [provider]  pantoprazole (PROTONIX) 40 MG tablet Take 1 tablet (40 mg total) by mouth daily before breakfast. 03/23/20   Mahala Menghini, PA-C  vitamin B-12 (CYANOCOBALAMIN) 100 MCG tablet Take 100 mcg by mouth daily.    [provider]    Current Facility-Administered Medications  Medication Dose Route Frequency Provider Last Rate Last Admin   0.9 % NaCl with KCl 40 mEq / L  infusion   Intravenous Continuous Tat, Shanon Brow, MD       enoxaparin (LOVENOX) injection 40 mg  40 mg Subcutaneous Q24H Adefeso, Oladapo, DO   40 mg at 02/20/21 2356   magnesium sulfate IVPB 2 g 50 mL  2 g Intravenous Once Tat, David, MD       pantoprazole (PROTONIX) injection 40 mg  40 mg Intravenous Q12H Tat, Shanon Brow, MD       potassium chloride 10 mEq in 100 mL IVPB  10 mEq Intravenous Q1 Hr x 2 Adefeso, Oladapo, DO 100 mL/hr at 02/21/21 0715 10 mEq at 02/21/21 0715   prochlorperazine (COMPAZINE) injection 10 mg  10 mg Intravenous Q6H PRN Bernadette Hoit, DO       Current Outpatient Medications  Medication Sig Dispense Refill   Cholecalciferol (VITAMIN D) 2000 units CAPS Take 1 capsule by mouth daily.     clozapine (CLOZARIL) 200 MG tablet Take 200 mg by mouth at bedtime.      fluvoxaMINE (LUVOX) 100 MG tablet Take 100 mg by mouth at bedtime. sleep      gemfibrozil (LOPID) 600 MG tablet Take 600 mg by mouth 2 (two) times daily before a meal.     pantoprazole (PROTONIX) 40 MG tablet Take 1 tablet (40 mg total) by mouth daily before breakfast. 30 tablet 11   vitamin B-12 (CYANOCOBALAMIN) 100 MCG tablet Take 100 mcg by mouth daily.      Allergies as of 02/20/2021   (No Known Allergies)    Family History  Problem Relation Age of Onset   Liver disease Neg Hx    Colon cancer Neg Hx     Social History   Socioeconomic History   Marital status: Single    Spouse name: Not on file   Number of children: Not on file   Years of education: Not on file   Highest education level: Not on file  Occupational History   Not on file  Tobacco Use   Smoking status: Every Day    Packs/day: 1.00    Years: 2.00    Pack years: 2.00  Types: Cigarettes   Smokeless tobacco: Never  Vaping Use   Vaping Use: Never used  Substance and Sexual Activity   Alcohol use: No    Comment: states "liquor every chance I can"; denied 03/21/17   Drug use: No   Sexual activity: Not Currently  Other Topics Concern   Not on file  Social History Narrative   Not on file   Social Determinants of Health   Financial Resource Strain: Not on file  Food Insecurity: Not on file  Transportation Needs: Not on file  Physical Activity: Not on file  Stress: Not on file  Social Connections: Not on file  Intimate Partner Violence: Not on file    Review of Systems: Gen: Denies fever, chills, cold or flulike symptoms, presyncope, syncope. CV: Denies chest pain or palpitations. Resp: Denies shortness of breath.  Chronic cough. GI: See HPI GU : Denies urinary burning, urinary frequency, urinary incontinence.  MS: Denies joint pain Derm: Denies rash Heme: See HPI  Physical Exam: Vital signs in last 24 hours: Temp:  [98.2 F (36.8 C)] 98.2 F (36.8 C) (06/20 1951) Pulse Rate:  [99-138] 117 (06/21 0636) Resp:  [16-25] 21 (06/21 0636) BP: (105-142)/(69-116) 107/77  (06/21 0700) SpO2:  [87 %-94 %] 91 % (06/21 0636) Weight:  [72.6 kg] 72.6 kg (06/20 1953)   General:   Alert,  Well-developed, well-nourished, pleasant and cooperative in NAD Head:  Normocephalic and atraumatic. Eyes:  Sclera clear, no icterus.   Conjunctiva pink. Ears:  Normal auditory acuity. Lungs:  Clear throughout to auscultation.   No wheezes, crackles, or rhonchi. No acute distress. Heart:  Regular rate and rhythm; no murmurs, clicks, rubs,  or gallops. Abdomen:  Soft and nondistended.  Minimal TTP in the epigastric area.  No masses, hepatosplenomegaly or hernias noted. Normal bowel sounds, without guarding, and without rebound.   Rectal:  Deferred    Msk:  Symmetrical without gross deformities. Normal posture. Extremities:  Without edema. Neurologic:  Alert and  oriented x4;  grossly normal neurologically. Skin:  Intact without significant lesions or rashes. Psych:  Normal mood and affect.  Intake/Output from previous day: No intake/output data recorded. Intake/Output this shift: No intake/output data recorded.  Lab Results: Recent Labs    02/20/21 2016 02/20/21 2215 02/21/21 0350  WBC 36.0* 23.0* 21.3*  HGB 14.2 12.2* 11.1*  HCT 40.9 36.0* 32.2*  PLT 232 205 193   BMET Recent Labs    02/20/21 2016 02/21/21 0350  NA 132* 133*  K 2.1* 2.6*  CL 79* 87*  CO2 37* 34*  GLUCOSE 150* 129*  BUN 43* 34*  CREATININE 1.75* 1.11  CALCIUM 9.7 8.7*   LFT Recent Labs    02/20/21 2016 02/21/21 0350  PROT 8.2* 6.1*  ALBUMIN 4.3 3.2*  AST 29 21  ALT 13 11  ALKPHOS 129* 94  BILITOT 1.0 0.7   PT/INR Recent Labs    02/21/21 0350  LABPROT 14.9  INR 1.2     Studies/Results: CT ABDOMEN PELVIS W CONTRAST  Result Date: 02/20/2021 CLINICAL DATA:  Nausea and vomiting. EXAM: CT ABDOMEN AND PELVIS WITH CONTRAST TECHNIQUE: Multidetector CT imaging of the abdomen and pelvis was performed using the standard protocol following bolus administration of intravenous  contrast. CONTRAST:  25mL OMNIPAQUE IOHEXOL 300 MG/ML  SOLN COMPARISON:  CT 01/28/2017 FINDINGS: Lower chest: Moderate hiatal hernia with wall thickening of the distal esophagus. No pleural effusion or focal airspace disease. Hepatobiliary: Diffusely decreased hepatic density typical of steatosis. No focal  hepatic abnormality. Gallbladder physiologically distended, no calcified stone. No biliary dilatation. Pancreas: No ductal dilatation or inflammation. Spleen: Normal in size without focal abnormality. Adrenals/Urinary Tract: Normal adrenal glands. No hydronephrosis or perinephric edema. Homogeneous renal enhancement with symmetric excretion on delayed phase imaging. Subcentimeter low-density in the posterior lower right kidney is too small to characterize but likely cyst. No renal calculi. Urinary bladder is physiologically distended without wall thickening. Stomach/Bowel: Moderate hiatal hernia with wall thickening of the distal esophagus. There is wall thickening of the gastric cardia. Occasional fluid-filled small bowel without obstruction or inflammation. Normal appendix. Small volume of colonic stool. Sigmoid colon is redundant. Areas of submucosal fatty infiltration in the colon again seen. There is no colonic inflammation. Vascular/Lymphatic: Mild aortic atherosclerosis. No aortic aneurysm. Patent portal vein. Circumaortic left renal vein. No enlarged lymph nodes in the abdomen or pelvis. Reproductive: Prominent prostate gland spans 5 cm transverse. Other: Fat containing left inguinal hernia.  No ascites or free air. Musculoskeletal: Chronic L1 superior endplate compression fracture. Bone islands in the right proximal femur again seen. IMPRESSION: 1. Moderate hiatal hernia with wall thickening of the distal esophagus and gastric cardia, suggesting reflux or esophagitis/gastritis. 2. Hepatic steatosis. 3. Small fat containing left inguinal hernia. Aortic Atherosclerosis (ICD10-I70.0). Electronically Signed    By: Keith Rake M.D.   On: 02/20/2021 22:14   DG Chest Portable 1 View  Result Date: 02/20/2021 CLINICAL DATA:  Vomiting.  Dyspnea. EXAM: PORTABLE CHEST 1 VIEW COMPARISON:  01/28/2017 FINDINGS: Heart size normal. No pleural effusion or edema. No airspace opacities identified. Visualized osseous structures are intact. IMPRESSION: No acute cardiopulmonary abnormalities. Electronically Signed   By: Kerby Moors M.D.   On: 02/20/2021 20:53    Impression: 60 year old male with history of GERD with documented ulcerative reflux esophagitis and Candida esophagitis on prior EGDs, constipation, HTN, schizophrenia who presented from [facility] with nausea, vomiting, associated upper abdominal pain, and generalized weakness for about 3 days.  He was found to have leukocytosis, hyponatremia, hypokalemia, elevated lactic acid.  CT A/P with moderate hiatal hernia with wall thickening of the distal esophagus and gastric cardia, suggesting reflux or esophagitis/gastritis.  Chest x-ray negative.  Acute onset of nausea/vomiting/ abdominal pain may be secondary to acute viral gastroenteritis versus uncontrolled GERD, esophagitis, gastritis, duodenitis, PUD, H. Pylori, gastric outlet obstruction.  Symptoms resolved since presenting to the emergency room, not requiring antiemetics.  Due to CT findings of wall thickening of the distal esophagus and gastric cardia, would recommend direct visualization with endoscopy. Suspect he has reflux esophagitis, but will need to rule out Candida esophagitis, Barrett's esophagus, and underlying malignancy though I suspect this is less likely. Consider EGD tomorrow once electrolyte abnormalities are corrected.   Hypokalemia/hyponatremia: Likely secondary to GI losses/poor p.o. intake in the setting of nausea/vomiting.  Potassium remains low at 2.6 this morning.  Mild hyponatremia at 133.  Correction per hospitalist.  Leukocytosis: 36.0 on admission, down to 21.3  today.  No specific source of infection identified.  Queried whether this was reactive.  He was started on empiric antibiotics per hospitalist.  Procalcitonin <0.10 today. Further management per hospitalist.   Anemia: Hemoglobin 14.2 on admission, 11.1 this morning.  No overt GI bleeding.  Suspect hemoconcentration on admission, now with hemodilution in the setting of IV fluids.  Continue to monitor.  Plan: 1.  Consider EGD with propofol with Dr. Laural Golden tomorrow pending correction of electrolyte abnormalities.  2.  Clear liquid diet today. 3.  Continue IV  PPI twice daily. 4.  Management of hypokalemia and hyponatremia per hospitalist. 5.  Management of leukocytosis/empiric antibiotics per hospitalist. 6.  Monitor H/H and for overt GI bleeding.   LOS: 1 day    02/21/2021, 7:44 AM   Aliene Altes, PA-C [facility]

## 2021-02-21 NOTE — Progress Notes (Signed)
PROGRESS NOTE  Danny Greene DVV:[id number] DOB: March 12, 1961 DOA: 02/20/2021 PCP: Lauretta Grill, NP  Brief History:  60 year old male with a history of schizophrenia, GERD, hypertension, hyperlipidemia, ulcerative and reflux esophagitis presenting with 3-day history of generalized weakness, nausea, vomiting, and abdominal pain.  He is a difficult historian often contradicting his symptoms in the same thought process.  Nevertheless, the patient states that he has been having dizziness and chest pain for the past few days.  He is not able to elaborate exacerbating or alleviating factors over the circumstances around his symptoms.  Review of the medical record shows that the patient has had vomiting and abdominal pain for the past 3 days.  There is no reports of hematemesis, fevers, chills.  The patient states that he smokes cigarettes and a pipe.  He denies any illicit drug use. In the emergency department, the patient was afebrile but tachycardic up to 130s.  He was hemodynamically stable albeit with soft blood pressures in the 100s.  Oxygen saturation was 87-90% on room air initially.  CT of the abdomen and pelvis showed a moderate hiatus hernia with wall thickening of the distal esophagus and gastric cardia.  There was hepatic steatosis and physiologic distention of the gallbladder.  There was occasional fluid-filled small bowel without any obstruction or dilatation.  The patient was admitted for further evaluation and treatment.  Assessment/Plan: Intractable nausea and vomiting -Likely due to gastritis/esophagitis -GI consult -clear liquid diet -protonix IV bid  Acute kidney injury -due to volume depletion and hemodynamic changes -baseline creatinine 0.8-1.1 -serum creatinine 1.75 at time of admission -continue IVF>>improved  SIRS -personally reviewed Chest x-ray negative for infiltrates -UA negative for pyuria -Follow blood cultures -lactate 4.5>>1.9 -start empiric zosyn to  cover aspiration -PCT <0.10 -continue IVF  Chest pain -D-dimer--1.68 -check CTA chest -follow troponins -EKG personally reviewed--sinus, nonspecific T wave changes -elevated troponin = demand ischemia -echo  Hypoxia -D-dimer--1.68 -check CTA chest  Hypomagnesemia -replete  Hypokalemia -replete  Hyponatremia -continue IVF -due to volume depletion  Schizophrenia, undifferentiated -Continue Clozaril  Hyperlipidemia -Holding Lopid      Status is: Inpatient  Remains inpatient appropriate because:Persistent severe electrolyte disturbances  Dispo: The patient is from: ALF              Anticipated d/c is to: ALF              Patient currently is not medically stable to d/c.   Difficult to place patient No        Family Communication:   no Family at bedside  Consultants:  GI  Code Status:  FULL  DVT Prophylaxis:  SCDs   Procedures: As Listed in Progress Note Above  Antibiotics: Zosyn 6/21>>      Subjective: Patient complains of chest pain.  No vomiting presently.  Complains of dizziness.  No sob.  Remainder ROS unobtainable due to patient's mental status  Objective: Vitals:   02/21/21 0330 02/21/21 0345 02/21/21 0530 02/21/21 0636  BP: 106/77  105/69 110/72  Pulse: 99 (!) 104  (!) 117  Resp: 19 (!) 21 (!) 21 (!) 21  Temp:      TempSrc:      SpO2: (!) 87% 90%  91%  Weight:      Height:       No intake or output data in the 24 hours ending 02/21/21 0733 Weight change:  Exam:  General:  Pt is alert, follows commands intermittently, not in  acute distress HEENT: No icterus, No thrush, No neck mass, [place]/AT Cardiovascular: RRR, S1/S2, no rubs, no gallops Respiratory: scattered bibasilar rales. No weheze Abdomen: Soft/+BS, non tender, non distended, no guarding Extremities: No edema, No lymphangitis, No petechiae, No rashes, no synovitis   Data Reviewed: I have personally reviewed following labs and imaging studies Basic Metabolic  Panel: Recent Labs  Lab 02/20/21 2016 02/20/21 2215 02/21/21 0350  NA 132*  --  133*  K 2.1*  --  2.6*  CL 79*  --  87*  CO2 37*  --  34*  GLUCOSE 150*  --  129*  BUN 43*  --  34*  CREATININE 1.75*  --  1.11  CALCIUM 9.7  --  8.7*  MG  --  1.1* 1.2*  PHOS  --   --  2.9   Liver Function Tests: Recent Labs  Lab 02/20/21 2016 02/21/21 0350  AST 29 21  ALT 13 11  ALKPHOS 129* 94  BILITOT 1.0 0.7  PROT 8.2* 6.1*  ALBUMIN 4.3 3.2*   Recent Labs  Lab 02/20/21 2016  LIPASE 22   No results for input(s): AMMONIA in the last 168 hours. Coagulation Profile: Recent Labs  Lab 02/21/21 0350  INR 1.2   CBC: Recent Labs  Lab 02/20/21 2016 02/20/21 2215 02/21/21 0350  WBC 36.0* 23.0* 21.3*  NEUTROABS  --  19.2*  --   HGB 14.2 12.2* 11.1*  HCT 40.9 36.0* 32.2*  MCV 96.7 96.5 95.8  PLT 232 205 193   Cardiac Enzymes: No results for input(s): CKTOTAL, CKMB, CKMBINDEX, TROPONINI in the last 168 hours. BNP: Invalid input(s): POCBNP CBG: No results for input(s): GLUCAP in the last 168 hours. HbA1C: No results for input(s): HGBA1C in the last 72 hours. Urine analysis:    Component Value Date/Time   COLORURINE YELLOW 02/20/2021 1955   APPEARANCEUR HAZY (A) 02/20/2021 1955   APPEARANCEUR Clear 12/24/2014 1603   LABSPEC 1.019 02/20/2021 1955   LABSPEC 1.011 12/24/2014 1603   PHURINE 5.0 02/20/2021 1955   GLUCOSEU 50 (A) 02/20/2021 1955   GLUCOSEU Negative 12/24/2014 1603   HGBUR SMALL (A) 02/20/2021 1955   BILIRUBINUR NEGATIVE 02/20/2021 1955   BILIRUBINUR Negative 12/24/2014 1603   KETONESUR NEGATIVE 02/20/2021 1955   PROTEINUR 30 (A) 02/20/2021 1955   UROBILINOGEN 0.2 06/09/2015 1540   NITRITE NEGATIVE 02/20/2021 1955   LEUKOCYTESUR NEGATIVE 02/20/2021 1955   LEUKOCYTESUR Negative 12/24/2014 1603   Sepsis Labs: @LABRCNTIP (procalcitonin:4,lacticidven:4) ) Recent Results (from the past 240 hour(s))  Resp Panel by RT-PCR (Flu A&B, Covid) Nasopharyngeal Swab      Status: None   Collection Time: 02/20/21  8:30 PM   Specimen: Nasopharyngeal Swab; Nasopharyngeal(NP) swabs in vial transport medium  Result Value Ref Range Status   SARS Coronavirus 2 by RT PCR NEGATIVE NEGATIVE Final    Comment: (NOTE) SARS-CoV-2 target nucleic acids are NOT DETECTED.  The SARS-CoV-2 RNA is generally detectable in upper respiratory specimens during the acute phase of infection. The lowest concentration of SARS-CoV-2 viral copies this assay can detect is 138 copies/mL. A negative result does not preclude SARS-Cov-2 infection and should not be used as the sole basis for treatment or other patient management decisions. A negative result may occur with  improper specimen collection/handling, submission of specimen other than nasopharyngeal swab, presence of viral mutation(s) within the areas targeted by this assay, and inadequate number of viral copies(<138 copies/mL). A negative result must be combined with clinical observations, patient history, and epidemiological information. The  expected result is Negative.  Fact Sheet for Patients:  EntrepreneurPulse.com.au  Fact Sheet for Healthcare Providers:  IncredibleEmployment.be  This test is no t yet approved or cleared by the [place] FDA and  has been authorized for detection and/or diagnosis of SARS-CoV-2 by FDA under an Emergency Use Authorization (EUA). This EUA will remain  in effect (meaning this test can be used) for the duration of the COVID-19 declaration under Section 564(b)(1) of the Act, 21 U.S.C.section 360bbb-3(b)(1), unless the authorization is terminated  or revoked sooner.       Influenza A by PCR NEGATIVE NEGATIVE Final   Influenza B by PCR NEGATIVE NEGATIVE Final    Comment: (NOTE) The Xpert Xpress SARS-CoV-2/FLU/RSV plus assay is intended as an aid in the diagnosis of influenza from Nasopharyngeal swab specimens and should not be used as a sole basis  for treatment. Nasal washings and aspirates are unacceptable for Xpert Xpress SARS-CoV-2/FLU/RSV testing.  Fact Sheet for Patients: EntrepreneurPulse.com.au  Fact Sheet for Healthcare Providers: IncredibleEmployment.be  This test is not yet approved or cleared by the [place] FDA and has been authorized for detection and/or diagnosis of SARS-CoV-2 by FDA under an Emergency Use Authorization (EUA). This EUA will remain in effect (meaning this test can be used) for the duration of the COVID-19 declaration under Section 564(b)(1) of the Act, 21 U.S.C. section 360bbb-3(b)(1), unless the authorization is terminated or revoked.  Performed at [facility], [address] [place], [place] [address]   Culture, blood (routine x 2)     Status: None (Preliminary result)   Collection Time: 02/20/21 10:15 PM   Specimen: BLOOD  Result Value Ref Range Status   Specimen Description BLOOD LEFT ANTECUBITAL  Final   Special Requests   Final    Blood Culture adequate volume BOTTLES DRAWN AEROBIC AND ANAEROBIC   Culture   Final    NO GROWTH < 12 HOURS Performed at [facility], [address], [place], [place] [address]    Report Status PENDING  Incomplete  Culture, blood (routine x 2)     Status: None (Preliminary result)   Collection Time: 02/20/21 10:21 PM   Specimen: BLOOD  Result Value Ref Range Status   Specimen Description BLOOD BLOOD LEFT HAND  Final   Special Requests   Final    Blood Culture adequate volume BOTTLES DRAWN AEROBIC AND ANAEROBIC   Culture   Final    NO GROWTH < 12 HOURS Performed at [facility], [address] [place], [place] [address]    Report Status PENDING  Incomplete     Scheduled Meds:  enoxaparin (LOVENOX) injection  40 mg Subcutaneous Q24H   Continuous Infusions:  potassium chloride 10 mEq (02/21/21 0715)    Procedures/Studies: CT ABDOMEN PELVIS W CONTRAST  Result Date: 02/20/2021 CLINICAL DATA:  Nausea and  vomiting. EXAM: CT ABDOMEN AND PELVIS WITH CONTRAST TECHNIQUE: Multidetector CT imaging of the abdomen and pelvis was performed using the standard protocol following bolus administration of intravenous contrast. CONTRAST:  62mL OMNIPAQUE IOHEXOL 300 MG/ML  SOLN COMPARISON:  CT 01/28/2017 FINDINGS: Lower chest: Moderate hiatal hernia with wall thickening of the distal esophagus. No pleural effusion or focal airspace disease. Hepatobiliary: Diffusely decreased hepatic density typical of steatosis. No focal hepatic abnormality. Gallbladder physiologically distended, no calcified stone. No biliary dilatation. Pancreas: No ductal dilatation or inflammation. Spleen: Normal in size without focal abnormality. Adrenals/Urinary Tract: Normal adrenal glands. No hydronephrosis or perinephric edema. Homogeneous renal enhancement with symmetric excretion on delayed phase  imaging. Subcentimeter low-density in the posterior lower right kidney is too small to characterize but likely cyst. No renal calculi. Urinary bladder is physiologically distended without wall thickening. Stomach/Bowel: Moderate hiatal hernia with wall thickening of the distal esophagus. There is wall thickening of the gastric cardia. Occasional fluid-filled small bowel without obstruction or inflammation. Normal appendix. Small volume of colonic stool. Sigmoid colon is redundant. Areas of submucosal fatty infiltration in the colon again seen. There is no colonic inflammation. Vascular/Lymphatic: Mild aortic atherosclerosis. No aortic aneurysm. Patent portal vein. Circumaortic left renal vein. No enlarged lymph nodes in the abdomen or pelvis. Reproductive: Prominent prostate gland spans 5 cm transverse. Other: Fat containing left inguinal hernia.  No ascites or free air. Musculoskeletal: Chronic L1 superior endplate compression fracture. Bone islands in the right proximal femur again seen. IMPRESSION: 1. Moderate hiatal hernia with wall thickening of the distal  esophagus and gastric cardia, suggesting reflux or esophagitis/gastritis. 2. Hepatic steatosis. 3. Small fat containing left inguinal hernia. Aortic Atherosclerosis (ICD10-I70.0). Electronically Signed   By: Keith Rake M.D.   On: 02/20/2021 22:14   DG Chest Portable 1 View  Result Date: 02/20/2021 CLINICAL DATA:  Vomiting.  Dyspnea. EXAM: PORTABLE CHEST 1 VIEW COMPARISON:  01/28/2017 FINDINGS: Heart size normal. No pleural effusion or edema. No airspace opacities identified. Visualized osseous structures are intact. IMPRESSION: No acute cardiopulmonary abnormalities. Electronically Signed   By: Kerby Moors M.D.   On: 02/20/2021 20:53    Orson Eva, DO  [facility]  If 7PM-7AM, please contact night-coverage [email or web address] Password [facility] 02/21/2021, 7:33 AM   LOS: 1 day

## 2021-02-22 ENCOUNTER — Inpatient Hospital Stay (HOSPITAL_COMMUNITY): Payer: Medicaid Other | Admitting: Anesthesiology

## 2021-02-22 ENCOUNTER — Encounter (HOSPITAL_COMMUNITY)
Admission: EM | Disposition: A | Discharge status: Home / Self Care | Payer: Self-pay | Source: Home / Self Care | Attending: Family Medicine

## 2021-02-22 DIAGNOSIS — R1013 Epigastric pain: Secondary | ICD-10-CM

## 2021-02-22 DIAGNOSIS — K449 Diaphragmatic hernia without obstruction or gangrene: Secondary | ICD-10-CM

## 2021-02-22 DIAGNOSIS — R112 Nausea with vomiting, unspecified: Secondary | ICD-10-CM

## 2021-02-22 DIAGNOSIS — E43 Unspecified severe protein-calorie malnutrition: Secondary | ICD-10-CM | POA: Insufficient documentation

## 2021-02-22 DIAGNOSIS — R1010 Upper abdominal pain, unspecified: Secondary | ICD-10-CM

## 2021-02-22 DIAGNOSIS — K209 Esophagitis, unspecified without bleeding: Secondary | ICD-10-CM

## 2021-02-22 HISTORY — PX: ESOPHAGOGASTRODUODENOSCOPY (EGD) WITH PROPOFOL: SHX5813

## 2021-02-22 HISTORY — PX: ESOPHAGEAL BRUSHING: SHX6842

## 2021-02-22 HISTORY — PX: BIOPSY: SHX5522

## 2021-02-22 LAB — POCT I-STAT, CHEM 8
BUN: 10 mg/dL (ref 6–20)
Calcium, Ion: 1.12 mmol/L — ABNORMAL LOW (ref 1.15–1.40)
Chloride: 104 mmol/L (ref 98–111)
Creatinine, Ser: 0.7 mg/dL (ref 0.61–1.24)
Glucose, Bld: 88 mg/dL (ref 70–99)
HCT: 25 % — ABNORMAL LOW (ref 39.0–52.0)
Hemoglobin: 8.5 g/dL — ABNORMAL LOW (ref 13.0–17.0)
Potassium: 3.7 mmol/L (ref 3.5–5.1)
Sodium: 140 mmol/L (ref 135–145)
TCO2: 24 mmol/L (ref 22–32)

## 2021-02-22 LAB — BASIC METABOLIC PANEL
Anion gap: 5 (ref 5–15)
Anion gap: 7 (ref 5–15)
BUN: 13 mg/dL (ref 6–20)
BUN: 12 mg/dL (ref 6–20)
CO2: 31 mmol/L (ref 22–32)
CO2: 27 mmol/L (ref 22–32)
Calcium: 8 mg/dL — ABNORMAL LOW (ref 8.9–10.3)
Calcium: 8.1 mg/dL — ABNORMAL LOW (ref 8.9–10.3)
Chloride: 103 mmol/L (ref 98–111)
Chloride: 104 mmol/L (ref 98–111)
Creatinine, Ser: 0.74 mg/dL (ref 0.61–1.24)
Creatinine, Ser: 0.77 mg/dL (ref 0.61–1.24)
GFR, Estimated: >60 mL/min (ref >60)
GFR, Estimated: >60 mL/min (ref >60)
Glucose, Bld: 90 mg/dL (ref 70–99)
Glucose, Bld: 89 mg/dL (ref 70–99)
Potassium: 2.9 mmol/L — ABNORMAL LOW (ref 3.5–5.1)
Potassium: 4 mmol/L (ref 3.5–5.1)
Sodium: 139 mmol/L (ref 135–145)
Sodium: 138 mmol/L (ref 135–145)

## 2021-02-22 LAB — CBC
HCT: 25.7 % — ABNORMAL LOW (ref 39.0–52.0)
Hemoglobin: 8.7 g/dL — ABNORMAL LOW (ref 13.0–17.0)
MCH: 33.7 pg (ref 26.0–34.0)
MCHC: 33.9 g/dL (ref 30.0–36.0)
MCV: 99.6 fL (ref 80.0–100.0)
Platelets: 168 10*3/uL (ref 150–400)
RBC: 2.58 MIL/uL — ABNORMAL LOW (ref 4.22–5.81)
RDW: 12.1 % (ref 11.5–15.5)
WBC: 9.7 10*3/uL (ref 4.0–10.5)
nRBC: 0 % (ref 0.0–0.2)

## 2021-02-22 LAB — KOH PREP: KOH Prep: NONE SEEN

## 2021-02-22 LAB — MAGNESIUM: Magnesium: 1.9 mg/dL (ref 1.7–2.4)

## 2021-02-22 SURGERY — ESOPHAGOGASTRODUODENOSCOPY (EGD) WITH PROPOFOL
Anesthesia: General

## 2021-02-22 MED ORDER — LIDOCAINE HCL (PF) 2 % IJ SOLN
INTRAMUSCULAR | Status: AC
  Filled 2021-02-22: qty 20

## 2021-02-22 MED ORDER — POTASSIUM CHLORIDE 10 MEQ/100ML IV SOLN
10.0000 meq | INTRAVENOUS | Status: AC
  Administered 2021-02-22 (×4): 10 meq via INTRAVENOUS
  Filled 2021-02-22 (×4): qty 100

## 2021-02-22 MED ORDER — FLUVOXAMINE MALEATE 50 MG PO TABS
100.0000 mg | ORAL_TABLET | Freq: Every day | ORAL | Status: DC
  Administered 2021-02-22 – 2021-02-23 (×2): 100 mg via ORAL
  Filled 2021-02-22 (×5): qty 1

## 2021-02-22 MED ORDER — LACTATED RINGERS IV SOLN: INTRAVENOUS | Status: DC | PRN

## 2021-02-22 MED ORDER — ENSURE ENLIVE PO LIQD
237.0000 mL | Freq: Three times a day (TID) | ORAL | Status: DC
  Administered 2021-02-22 – 2021-02-24 (×4): 237 mL via ORAL
  Filled 2021-02-22: qty 237

## 2021-02-22 MED ORDER — BOOST / RESOURCE BREEZE PO LIQD CUSTOM
1.0000 | Freq: Three times a day (TID) | ORAL | Status: DC
  Administered 2021-02-22 – 2021-02-24 (×3): 1 via ORAL
  Filled 2021-02-22: qty 1

## 2021-02-22 MED ORDER — SUCRALFATE 1 GM/10ML PO SUSP
1.0000 g | Freq: Three times a day (TID) | ORAL | Status: DC
  Administered 2021-02-22 – 2021-02-24 (×8): 1 g via ORAL
  Filled 2021-02-22 (×8): qty 10

## 2021-02-22 MED ORDER — PROPOFOL 10 MG/ML IV BOLUS
INTRAVENOUS | Status: DC | PRN
  Administered 2021-02-22: 20 mg via INTRAVENOUS
  Administered 2021-02-22: 50 mg via INTRAVENOUS
  Administered 2021-02-22: 20 mg via INTRAVENOUS
  Administered 2021-02-22 (×2): 50 mg via INTRAVENOUS

## 2021-02-22 MED ORDER — PROSOURCE PLUS PO LIQD
30.0000 mL | Freq: Three times a day (TID) | ORAL | Status: DC
  Administered 2021-02-22 – 2021-02-24 (×5): 30 mL via ORAL
  Filled 2021-02-22 (×9): qty 30

## 2021-02-22 MED ORDER — VITAMIN D 25 MCG (1000 UNIT) PO TABS
2000.0000 [IU] | ORAL_TABLET | Freq: Every day | ORAL | Status: DC
  Administered 2021-02-22 – 2021-02-24 (×3): 2000 [IU] via ORAL
  Filled 2021-02-22 (×3): qty 2

## 2021-02-22 MED ORDER — VITAMIN B-12 100 MCG PO TABS
100.0000 ug | ORAL_TABLET | Freq: Every day | ORAL | Status: DC
  Administered 2021-02-23 – 2021-02-24 (×2): 100 ug via ORAL
  Filled 2021-02-22 (×2): qty 1

## 2021-02-22 MED ORDER — ADULT MULTIVITAMIN W/MINERALS CH
1.0000 | ORAL_TABLET | Freq: Every day | ORAL | Status: DC
  Administered 2021-02-22 – 2021-02-24 (×3): 1 via ORAL
  Filled 2021-02-22 (×5): qty 1

## 2021-02-22 MED ORDER — CLOZAPINE 100 MG PO TABS
200.0000 mg | ORAL_TABLET | Freq: Every day | ORAL | Status: DC
  Administered 2021-02-22 – 2021-02-23 (×2): 200 mg via ORAL
  Filled 2021-02-22 (×5): qty 2

## 2021-02-22 MED ORDER — LIDOCAINE HCL 1 % IJ SOLN
INTRAMUSCULAR | Status: DC | PRN
  Administered 2021-02-22: 75 mg via INTRADERMAL

## 2021-02-22 MED ORDER — SODIUM CHLORIDE 0.9 % IV SOLN: INTRAVENOUS | Status: DC

## 2021-02-22 MED ORDER — SODIUM CHLORIDE FLUSH 0.9 % IV SOLN
INTRAVENOUS | Status: AC
  Filled 2021-02-22: qty 10

## 2021-02-22 NOTE — Progress Notes (Signed)
Subjective:  Patient complains of nausea and epigastric pain.  He has not vomited today.  He does notice some chest discomfort on drinking water.  He denies melena or rectal bleeding.  He states he has been taking pantoprazole every day.  He does not take NSAIDs.  Current Medications: Current Facility-Administered Medications  Medication Dose Route Frequency Provider Last Rate Last Admin   (feeding supplement) PROSource Plus liquid 30 mL  30 mL Oral TID BM Johnson, Clanford L, MD       0.9 % NaCl with KCl 40 mEq / L  infusion   Intravenous Continuous Tat, David, MD 100 mL/hr at 02/22/21 1138 New Bag at 02/22/21 1138   [MAR Hold] enoxaparin (LOVENOX) injection 40 mg  40 mg Subcutaneous Q24H Adefeso, Oladapo, DO   40 mg at 02/21/21 2148   feeding supplement (BOOST / RESOURCE BREEZE) liquid 1 Container  1 Container Oral TID BM Johnson, Clanford L, MD       feeding supplement (ENSURE ENLIVE / ENSURE PLUS) liquid 237 mL  237 mL Oral TID BM Johnson, Clanford L, MD       multivitamin with minerals tablet 1 tablet  1 tablet Oral Daily Johnson, Clanford L, MD       [MAR Hold] pantoprazole (PROTONIX) injection 40 mg  40 mg Intravenous Q12H Tat, Shanon Brow, MD   40 mg at 02/22/21 0816   [MAR Hold] piperacillin-tazobactam (ZOSYN) IVPB 3.375 g  3.375 g Intravenous Q8H Tat, Shanon Brow, MD 12.5 mL/hr at 02/22/21 1350 3.375 g at 02/22/21 1350   [MAR Hold] prochlorperazine (COMPAZINE) injection 10 mg  10 mg Intravenous Q6H PRN Adefeso, Oladapo, DO   10 mg at 02/21/21 1151     Objective: Blood pressure 97/66, pulse 72, temperature 98.1 F (36.7 C), temperature source Oral, resp. rate 18, height $RemoveBe'5\' 11"'GTDRpdOUL$  (1.803 m), weight 67.6 kg, SpO2 97 %. Patient is alert and in no acute distress. Conjunctiva is pale.. Sclera is nonicteric Oropharyngeal mucosa is normal. Dentition in poor condition. No neck masses or thyromegaly noted. Cardiac exam with regular rhythm normal S1 and S2. No murmur or gallop noted. Lungs are  clear to auscultation. Abdomen is symmetrical.  Bowel sounds are normal.  On palpation abdomen soft.  He has mild midepigastric tenderness.  No organomegaly or masses. No LE edema or clubbing noted.  Labs/studies Results:   CBC Latest Ref Rng & Units 02/22/2021 02/21/2021 02/20/2021  WBC 4.0 - 10.5 K/uL 9.7 21.3(H) 23.0(H)  Hemoglobin 13.0 - 17.0 g/dL 8.7(L) 11.1(L) 12.2(L)  Hematocrit 39.0 - 52.0 % 25.7(L) 32.2(L) 36.0(L)  Platelets 150 - 400 K/uL 168 193 205    CMP Latest Ref Rng & Units 02/22/2021 02/21/2021 02/20/2021  Glucose 70 - 99 mg/dL 90 129(H) 150(H)  BUN 6 - 20 mg/dL 13 34(H) 43(H)  Creatinine 0.61 - 1.24 mg/dL 0.74 1.11 1.75(H)  Sodium 135 - 145 mmol/L 139 133(L) 132(L)  Potassium 3.5 - 5.1 mmol/L 2.9(L) 2.6(LL) 2.1(LL)  Chloride 98 - 111 mmol/L 103 87(L) 79(L)  CO2 22 - 32 mmol/L 31 34(H) 37(H)  Calcium 8.9 - 10.3 mg/dL 8.0(L) 8.7(L) 9.7  Total Protein 6.5 - 8.1 g/dL - 6.1(L) 8.2(H)  Total Bilirubin 0.3 - 1.2 mg/dL - 0.7 1.0  Alkaline Phos 38 - 126 U/L - 94 129(H)  AST 15 - 41 U/L - 21 29  ALT 0 - 44 U/L - 11 13    Hepatic Function Latest Ref Rng & Units 02/21/2021 02/20/2021 02/26/2019  Total Protein 6.5 -  8.1 g/dL 6.1(L) 8.2(H) 7.1  Albumin 3.5 - 5.0 g/dL 3.2(L) 4.3 4.6  AST 15 - 41 U/L 21 29 47(H)  ALT 0 - 44 U/L $Remo'11 13 30  'TJVvx$ Alk Phosphatase 38 - 126 U/L 94 129(H) 118(H)  Total Bilirubin 0.3 - 1.2 mg/dL 0.7 1.0 0.2  Bilirubin, Direct 0.00 - 0.40 mg/dL - - 0.08     Assessment:  #1.  Nausea vomiting and epigastric pain.  He has a history of erosive reflux esophagitis as well as Candida esophagitis.  It remains to be seen if he has developed peptic ulcer disease.  CT revealed moderate size sliding hiatal hernia and wall thickening to distal esophagus and cardia.  #2.  Anemia.  Hemoglobin was normal on admission.  Hemoglobin has dropped by more than 5 g which would appear to be due to hydration as there is no evidence of active GI bleed.  #3.  Leukocytosis secondary to  acute illness and dehydration.  WBC is normal today.  #4.  Hypokalemia.  Serum potassium this morning was 2.9.  Patient has received IV KCl this morning.  Repeat potassium using i-STAT is 3.7.   Plan:  Proceed with diagnostic esophagogastroduodenoscopy. Patient is agreeable.

## 2021-02-22 NOTE — Progress Notes (Signed)
Initial Nutrition Assessment  DOCUMENTATION CODES:  Severe malnutrition in context of chronic illness  INTERVENTION:  Advance diet as medically able and as tolerated.  Once diet advances to clear liquids, add: Boost Breeze po TID, each supplement provides 250 kcal and 9 grams of protein. 30 ml ProSource Plus po TID, each supplement provides 100 kcal and 15 grams of protein.    Once diet advances to full liquids, add: Ensure Enlive po TID, each supplement provides 350 kcal and 20 grams of protein. Magic cup TID with meals, each supplement provides 290 kcal and 9 grams of protein. Discontinue Boost Breeze TID.  Add MVI with minerals daily.  NUTRITION DIAGNOSIS:  Severe Malnutrition related to chronic illness as evidenced by severe fat depletion, severe muscle depletion.  GOAL:  Patient will meet greater than or equal to 90% of their needs  MONITOR:  Diet advancement, PO intake, Supplement acceptance, Labs, Weight trends, I & O's  REASON FOR ASSESSMENT:  Malnutrition Screening Tool    ASSESSMENT:  60 yo male with a PMH of schizophrenia, HTN, GERD, and HLD who present with abdominal pain, nausea, and vomiting. 6/20 - advanced to clears 6/22 - CT A/P with moderate hiatal hernia with wall thickening of the distal esophagus and gastric cardia, suggesting reflux or esophagitis/gastritis - GI recommends EGD once labs improve; NPO  Per H&P, "Patient was unable to provide history, history was obtained from ED physician and ED medical record. Per report, patient has been having nausea and vomiting for about 3 days and has not been able to keep anything down. Vomitus was nonbloody, patient presents with nonspecific abdominal pain which he was unable to rate."  Pt able to give some history about him eating very well at the group home he resides in. He says he really enjoys tuna, but could not provide more information regarding that.  Pt reports weighing 163 lbs last week when his doctor  came to the group home for his check up. Pt weighed 160 lbs on admission and now weighs 149 lbs, which is a 10 lb difference overnight. RD suspects this is a difference in scales. Pt with very minimal weight history in Epic and Care Everywhere.  On exam, pt severely depleted in most areas throughout his body.  Given above information, pt is severely malnourished in the chronic setting. RD suspects he has been malnourished for quite sometime.  Recommend advancing diet as tolerated and as medically able. On clear liquids, add Boost Breeze TID and ProSource TID. Once on fulls, discontinue Boost Breeze and add Ensure Enlive TID and Magic Cup TID. Also add MVI with minerals daily.  Medications: reviewed; Protonix, NaCl with KCl 40 mEq @ 100 ml/hr via IV, Zosyn TID via IV  Labs: reviewed; K 2.9 (L), serum Ca 8 (L)  NUTRITION - FOCUSED PHYSICAL EXAM: Flowsheet Row Most Recent Value  Orbital Region Moderate depletion  Upper Arm Region Severe depletion  Thoracic and Lumbar Region Moderate depletion  Buccal Region Severe depletion  Temple Region Severe depletion  Clavicle Bone Region Moderate depletion  Clavicle and Acromion Bone Region Moderate depletion  Scapular Bone Region Moderate depletion  Dorsal Hand Severe depletion  Patellar Region Severe depletion  Anterior Thigh Region Severe depletion  Posterior Calf Region Severe depletion  Edema (RD Assessment) None  Hair Reviewed  Eyes Reviewed  Mouth Reviewed  Skin Reviewed  [Dry, flaky]  Nails Reviewed   Diet Order:   Diet Order  Diet NPO time specified  Diet effective midnight                  EDUCATION NEEDS:  Education needs have been addressed  Skin:  Skin Assessment: Reviewed RN Assessment (L knee abrasion)  Last BM:  02/21/21  Height:  Ht Readings from Last 1 Encounters:  02/21/21 5\' 11"  (1.803 m)   Weight:  Wt Readings from Last 1 Encounters:  02/21/21 67.6 kg   Ideal Body Weight:  78.2 kg  BMI:   Body mass index is 20.79 kg/m.  Estimated Nutritional Needs:  Kcal:  2100-2300 Protein:  95-110 grams Fluid:  >2 L  Derrel Nip, RD, LDN Registered Dietitian I After-Hours/Weekend Pager # in [place]

## 2021-02-22 NOTE — Anesthesia Postprocedure Evaluation (Signed)
Anesthesia Post Note  Patient: Danny Greene  Procedure(s) Performed: ESOPHAGOGASTRODUODENOSCOPY (EGD) WITH PROPOFOL ESOPHAGEAL BRUSHING BIOPSY  Patient location during evaluation: PACU Anesthesia Type: General Level of consciousness: awake and alert Pain management: pain level controlled Vital Signs Assessment: post-procedure vital signs reviewed and stable Respiratory status: spontaneous breathing Cardiovascular status: stable Postop Assessment: no apparent nausea or vomiting Anesthetic complications: no   No notable events documented.   Last Vitals:  Vitals:   02/22/21 0527 02/22/21 1417  BP: 97/66 (!) 89/65  Pulse: 72 64  Resp: 18 18  Temp: 36.7 C 36.8 C  SpO2: 97% 94%    Last Pain:  Vitals:   02/22/21 1439  TempSrc:   PainSc: 0-No pain                 Adahlia Stembridge

## 2021-02-22 NOTE — Progress Notes (Addendum)
PROGRESS NOTE   Danny Greene  OXB:[id number] DOB: 09-25-1960 DOA: 02/20/2021 PCP: Lauretta Grill, NP   Chief Complaint  Patient presents with   Emesis   Level of care: Telemetry  Brief Admission History:  60 y.o. male with medical history significant for schizophrenia, GERD, hyperlipidemia who presents to the emergency department due to generalized weakness and abdominal discomfort.  Patient was unable to provide history, history was obtained from ED physician and ED medical record.  Per report, patient has been having nausea and vomiting for about 3 days and has not been able to keep anything down.  Vomitus was nonbloody, patient presents with nonspecific abdominal pain which he was unable to rate.  No reported fever, diarrhea chest pain, fever or chills.   In the emergency department, he was tachypneic and tachycardic, BP was 142/116.  Work-up in the ED shows leukocytosis, normocytic anemia, lactic acidosis, hypokalemia, hyperglycemia, BUN/creatinine 43/1.75 (no recent prior labs for comparison--last creatinine on record was 3 years ago and it was 0.9-1.0).  CT abdomen and pelvis with contrast showed moderate hiatal hernia with wall thickening of the distal esophagus and gastric cardia, suggesting reflux or esophagitis/gastritis.  Chest x-ray showed no acute cardiopulmonary abnormalities.  IV hydration was provided, patient was empirically started on IV antibiotics, Protonix was given.  And potassium was replenished.  Hospitalist was asked to admit patient for further evaluation and management.  Assessment & Plan:   Principal Problem:   Abdominal pain Active Problems:   Hypokalemia   Dehydration   GERD (gastroesophageal reflux disease)   AKI (acute kidney injury) (HCC)   Intractable nausea and vomiting   Leukocytosis   Lactic acidosis   Hyperglycemia   Prolonged QT interval   SIRS (systemic inflammatory response syndrome) (HCC)   Protein-calorie malnutrition, severe   Severe  esophagitis - seen by upper endoscopy on 6/22.  Discussed with Dr. Laural Golden, see recommendations and new orders started.  Continue supportive measures and symptomatic management.    GERD - IV protonix ordered.   Sucralfate ordered by GI team.   AKI - prerenal - treated with IV fluid hydration.    Hypokalemia - IV replacement and corrected, recheck in AM with magnesium.   Lactic acidosis - resolved.   Schizophrenia - chronic on clozapine, resume as soon as able.   Hyperlipidemia - stable.  Temporarily held gemfibrozil.   DVT prophylaxis: enoxaparin  Code Status: Full  Family Communication: bedside update  Disposition: home  Status is: Inpatient    Dispo: The patient is from: Home              Anticipated d/c is to: Home              Patient currently is not medically stable to d/c.   Difficult to place patient No    Consultants:  GI   Procedures:  EGD 02/22/21  Antimicrobials:  N/a    Subjective: Pt without any specific complaints.   Objective: Vitals:   02/22/21 0527 02/22/21 1417 02/22/21 1503 02/22/21 1515  BP: 97/66 (!) 89/65 100/71 104/68  Pulse: 72 64    Resp: 18 18 (!) 29 (!) 25  Temp: 98.1 F (36.7 C) 98.3 F (36.8 C) 98.2 F (36.8 C) 98.2 F (36.8 C)  TempSrc: Oral Oral    SpO2: 97% 94% 94% 94%  Weight:      Height:        Intake/Output Summary (Last 24 hours) at 02/22/2021 1542 Last data filed at 02/22/2021 1506 Gross  per 24 hour  Intake 2018.79 ml  Output 475 ml  Net 1543.79 ml   Filed Weights   02/20/21 1953 02/21/21 1813  Weight: 72.6 kg 67.6 kg    Examination:  General exam: Appears calm and comfortable  Respiratory system: Clear to auscultation. Respiratory effort normal. Cardiovascular system: normal S1 & S2 heard. No JVD, murmurs, rubs, gallops or clicks. No pedal edema. Gastrointestinal system: Abdomen is nondistended, soft and nontender. No organomegaly or masses felt. Normal bowel sounds heard. Central nervous system: Alert  and oriented. No focal neurological deficits. Extremities: Symmetric 5 x 5 power. Skin: No rashes, lesions or ulcers Psychiatry: Judgement and insight appear normal. Mood & affect appropriate.   Data Reviewed: I have personally reviewed following labs and imaging studies  CBC: Recent Labs  Lab 02/20/21 2016 02/20/21 2215 02/21/21 0350 02/22/21 0502 02/22/21 1421  WBC 36.0* 23.0* 21.3* 9.7  --   NEUTROABS  --  19.2*  --   --   --   HGB 14.2 12.2* 11.1* 8.7* 8.5*  HCT 40.9 36.0* 32.2* 25.7* 25.0*  MCV 96.7 96.5 95.8 99.6  --   PLT 232 205 193 168  --     Basic Metabolic Panel: Recent Labs  Lab 02/20/21 2016 02/20/21 2215 02/21/21 0350 02/22/21 0502 02/22/21 1326 02/22/21 1421  NA 132*  --  133* 139 138 140  K 2.1*  --  2.6* 2.9* 4.0 3.7  CL 79*  --  87* 103 104 104  CO2 37*  --  34* 31 27  --   GLUCOSE 150*  --  129* 90 89 88  BUN 43*  --  34* 13 12 10   CREATININE 1.75*  --  1.11 0.74 0.77 0.70  CALCIUM 9.7  --  8.7* 8.0* 8.1*  --   MG  --  1.1* 1.2* 1.9  --   --   PHOS  --   --  2.9  --   --   --     GFR: Estimated Creatinine Clearance: 93.9 mL/min (by C-G formula based on SCr of 0.7 mg/dL).  Liver Function Tests: Recent Labs  Lab 02/20/21 2016 02/21/21 0350  AST 29 21  ALT 13 11  ALKPHOS 129* 94  BILITOT 1.0 0.7  PROT 8.2* 6.1*  ALBUMIN 4.3 3.2*    CBG: No results for input(s): GLUCAP in the last 168 hours.  Recent Results (from the past 240 hour(s))  Resp Panel by RT-PCR (Flu A&B, Covid) Nasopharyngeal Swab     Status: None   Collection Time: 02/20/21  8:30 PM   Specimen: Nasopharyngeal Swab; Nasopharyngeal(NP) swabs in vial transport medium  Result Value Ref Range Status   SARS Coronavirus 2 by RT PCR NEGATIVE NEGATIVE Final    Comment: (NOTE) SARS-CoV-2 target nucleic acids are NOT DETECTED.  The SARS-CoV-2 RNA is generally detectable in upper respiratory specimens during the acute phase of infection. The lowest concentration of SARS-CoV-2  viral copies this assay can detect is 138 copies/mL. A negative result does not preclude SARS-Cov-2 infection and should not be used as the sole basis for treatment or other patient management decisions. A negative result may occur with  improper specimen collection/handling, submission of specimen other than nasopharyngeal swab, presence of viral mutation(s) within the areas targeted by this assay, and inadequate number of viral copies(<138 copies/mL). A negative result must be combined with clinical observations, patient history, and epidemiological information. The expected result is Negative.  Fact Sheet for Patients:  EntrepreneurPulse.com.au  Fact  Sheet for Healthcare Providers:  IncredibleEmployment.be  This test is no t yet approved or cleared by the [place] FDA and  has been authorized for detection and/or diagnosis of SARS-CoV-2 by FDA under an Emergency Use Authorization (EUA). This EUA will remain  in effect (meaning this test can be used) for the duration of the COVID-19 declaration under Section 564(b)(1) of the Act, 21 U.S.C.section 360bbb-3(b)(1), unless the authorization is terminated  or revoked sooner.       Influenza A by PCR NEGATIVE NEGATIVE Final   Influenza B by PCR NEGATIVE NEGATIVE Final    Comment: (NOTE) The Xpert Xpress SARS-CoV-2/FLU/RSV plus assay is intended as an aid in the diagnosis of influenza from Nasopharyngeal swab specimens and should not be used as a sole basis for treatment. Nasal washings and aspirates are unacceptable for Xpert Xpress SARS-CoV-2/FLU/RSV testing.  Fact Sheet for Patients: EntrepreneurPulse.com.au  Fact Sheet for Healthcare Providers: IncredibleEmployment.be  This test is not yet approved or cleared by the [place] FDA and has been authorized for detection and/or diagnosis of SARS-CoV-2 by FDA under an Emergency Use Authorization  (EUA). This EUA will remain in effect (meaning this test can be used) for the duration of the COVID-19 declaration under Section 564(b)(1) of the Act, 21 U.S.C. section 360bbb-3(b)(1), unless the authorization is terminated or revoked.  Performed at [facility], [address] [place], [place] [address]   Culture, blood (routine x 2)     Status: None (Preliminary result)   Collection Time: 02/20/21 10:15 PM   Specimen: BLOOD  Result Value Ref Range Status   Specimen Description BLOOD LEFT ANTECUBITAL  Final   Special Requests   Final    Blood Culture adequate volume BOTTLES DRAWN AEROBIC AND ANAEROBIC   Culture   Final    NO GROWTH < 12 HOURS Performed at [facility], [address], [place], [place] [address]    Report Status PENDING  Incomplete  Culture, blood (routine x 2)     Status: None (Preliminary result)   Collection Time: 02/20/21 10:21 PM   Specimen: BLOOD  Result Value Ref Range Status   Specimen Description BLOOD BLOOD LEFT HAND  Final   Special Requests   Final    Blood Culture adequate volume BOTTLES DRAWN AEROBIC AND ANAEROBIC   Culture   Final    NO GROWTH < 12 HOURS Performed at [facility], [address] [place], [place] [address]    Report Status PENDING  Incomplete     Radiology Studies: CT Angio Chest Pulmonary Embolism (PE) W or WO Contrast  Result Date: 02/21/2021 CLINICAL DATA:  Three days of postprandial nausea and vomiting and chest pain. EXAM: CT ANGIOGRAPHY CHEST WITH CONTRAST TECHNIQUE: Multidetector CT imaging of the chest was performed using the standard protocol during bolus administration of intravenous contrast. Multiplanar CT image reconstructions and MIPs were obtained to evaluate the vascular anatomy. CONTRAST:  197mL OMNIPAQUE IOHEXOL 350 MG/ML SOLN COMPARISON:  05/25/2015 FINDINGS: Cardiovascular: The heart is normal in size. No pericardial effusion. The aorta is normal in caliber. No dissection or atherosclerotic calcification. No  obvious coronary artery calcifications. The pulmonary arterial tree is fairly well opacified. No filling defects to suggest pulmonary embolism. Mediastinum/Nodes: Diffusely and markedly thickened esophageal wall appears fairly similar to the prior CT scan and it appears the patient has a history of intermittent severe esophagitis. Findings suspicious for recurrent esophagitis. No obvious esophageal mass. There is a small hiatal hernia again noted. No mediastinal or hilar  mass or lymphadenopathy. Small scattered lymph nodes are stable. The thyroid gland is grossly normal. Lungs/Pleura: No acute pulmonary findings. No infiltrates, edema or effusions. No worrisome pulmonary lesions or pleural effusions. Upper Abdomen: No significant upper abdominal findings. Musculoskeletal: The bony thorax is intact. Review of the MIP images confirms the above findings. IMPRESSION: 1. No CT findings for pulmonary embolism. 2. Normal thoracic aorta. 3. Diffusely and markedly thickened esophageal wall likely the severe diffuse esophagitis. 4. No acute pulmonary findings. 5. Aortic atherosclerosis. Aortic Atherosclerosis (ICD10-I70.0). Electronically Signed   By: Marijo Sanes M.D.   On: 02/21/2021 12:32   CT ABDOMEN PELVIS W CONTRAST  Result Date: 02/20/2021 CLINICAL DATA:  Nausea and vomiting. EXAM: CT ABDOMEN AND PELVIS WITH CONTRAST TECHNIQUE: Multidetector CT imaging of the abdomen and pelvis was performed using the standard protocol following bolus administration of intravenous contrast. CONTRAST:  4mL OMNIPAQUE IOHEXOL 300 MG/ML  SOLN COMPARISON:  CT 01/28/2017 FINDINGS: Lower chest: Moderate hiatal hernia with wall thickening of the distal esophagus. No pleural effusion or focal airspace disease. Hepatobiliary: Diffusely decreased hepatic density typical of steatosis. No focal hepatic abnormality. Gallbladder physiologically distended, no calcified stone. No biliary dilatation. Pancreas: No ductal dilatation or inflammation.  Spleen: Normal in size without focal abnormality. Adrenals/Urinary Tract: Normal adrenal glands. No hydronephrosis or perinephric edema. Homogeneous renal enhancement with symmetric excretion on delayed phase imaging. Subcentimeter low-density in the posterior lower right kidney is too small to characterize but likely cyst. No renal calculi. Urinary bladder is physiologically distended without wall thickening. Stomach/Bowel: Moderate hiatal hernia with wall thickening of the distal esophagus. There is wall thickening of the gastric cardia. Occasional fluid-filled small bowel without obstruction or inflammation. Normal appendix. Small volume of colonic stool. Sigmoid colon is redundant. Areas of submucosal fatty infiltration in the colon again seen. There is no colonic inflammation. Vascular/Lymphatic: Mild aortic atherosclerosis. No aortic aneurysm. Patent portal vein. Circumaortic left renal vein. No enlarged lymph nodes in the abdomen or pelvis. Reproductive: Prominent prostate gland spans 5 cm transverse. Other: Fat containing left inguinal hernia.  No ascites or free air. Musculoskeletal: Chronic L1 superior endplate compression fracture. Bone islands in the right proximal femur again seen. IMPRESSION: 1. Moderate hiatal hernia with wall thickening of the distal esophagus and gastric cardia, suggesting reflux or esophagitis/gastritis. 2. Hepatic steatosis. 3. Small fat containing left inguinal hernia. Aortic Atherosclerosis (ICD10-I70.0). Electronically Signed   By: Keith Rake M.D.   On: 02/20/2021 22:14   DG Chest Portable 1 View  Result Date: 02/20/2021 CLINICAL DATA:  Vomiting.  Dyspnea. EXAM: PORTABLE CHEST 1 VIEW COMPARISON:  01/28/2017 FINDINGS: Heart size normal. No pleural effusion or edema. No airspace opacities identified. Visualized osseous structures are intact. IMPRESSION: No acute cardiopulmonary abnormalities. Electronically Signed   By: Kerby Moors M.D.   On: 02/20/2021 20:53     Scheduled Meds:  (feeding supplement) PROSource Plus  30 mL Oral TID BM   enoxaparin (LOVENOX) injection  40 mg Subcutaneous Q24H   feeding supplement  1 Container Oral TID BM   feeding supplement  237 mL Oral TID BM   multivitamin with minerals  1 tablet Oral Daily   pantoprazole (PROTONIX) IV  40 mg Intravenous Q12H   sucralfate  1 g Oral TID WC & HS   Continuous Infusions:  0.9 % NaCl with KCl 40 mEq / L 100 mL/hr at 02/22/21 1138   piperacillin-tazobactam (ZOSYN)  IV 3.375 g (02/22/21 1350)     LOS: 2 days   Time  spent: 35 mins   Irwin Brakeman, MD How to contact the [facility] Attending or Consulting provider [place] or covering provider during after hours [place], for this patient?  Check the care team in [facility] and look for a) attending/consulting [facility] provider listed and b) the [facility] team listed Log into [email or web address] and use [place]'s universal password to access. If you do not have the password, please contact the hospital operator. Locate the [facility] provider you are looking for under [facility] and page to a number that you can be directly reached. If you still have difficulty reaching the provider, please page the [facility] (Director on Call) for the Hospitalists listed on amion for assistance.  02/22/2021, 3:42 PM

## 2021-02-22 NOTE — Progress Notes (Signed)
Brief EGD note.  Extensive ulceration to esophageal mucosa extending from 25 cm down to GE junction which is at 38 cm. Focal nodularity at GE junction.  Biopsy taken. 6 cm sliding hiatal hernia. Normal exam of the stomach first and second part of duodenum. Brushing taken for KOH form esophageal mucosa. Biopsy also taken from ulcerated mucosa at mid esophagus. Patient tolerated the procedure well.

## 2021-02-22 NOTE — Transfer of Care (Signed)
Immediate Anesthesia Transfer of Care Note  Patient: Danny Greene  Procedure(s) Performed: ESOPHAGOGASTRODUODENOSCOPY (EGD) WITH PROPOFOL ESOPHAGEAL BRUSHING BIOPSY  Patient Location: PACU  Anesthesia Type:General  Level of Consciousness: awake  Airway & Oxygen Therapy: Patient Spontanous Breathing  Post-op Assessment: Report given to RN  Post vital signs: Reviewed  Last Vitals:  Vitals Value Taken Time  BP 100/71 02/22/21 1503  Temp    Pulse    Resp 25 02/22/21 1506  SpO2    Vitals shown include unvalidated device data.  Last Pain:  Vitals:   02/22/21 1439  TempSrc:   PainSc: 0-No pain      Patients Stated Pain Goal: 7 (84/69/62 9528)  Complications: No notable events documented.

## 2021-02-22 NOTE — Anesthesia Preprocedure Evaluation (Signed)
Anesthesia Evaluation  Patient identified by MRN, date of birth, ID band Patient awake    Reviewed: Allergy & Precautions, NPO status , Patient's Chart, lab work & pertinent test results  History of Anesthesia Complications Negative for: history of anesthetic complications  Airway Mallampati: II  TM Distance: >3 FB Neck ROM: Full    Dental  (+) Dental Advisory Given, Missing, Chipped, Poor Dentition   Pulmonary Current Smoker and Patient abstained from smoking.,    Pulmonary exam normal breath sounds clear to auscultation       Cardiovascular Exercise Tolerance: Good hypertension, Pt. on medications Normal cardiovascular exam Rhythm:Regular Rate:Normal     Neuro/Psych PSYCHIATRIC DISORDERS Schizophrenia    GI/Hepatic hiatal hernia, PUD, GERD  Medicated,  Endo/Other    Renal/GU Renal disease     Musculoskeletal   Abdominal   Peds  Hematology  (+) anemia ,   Anesthesia Other Findings   Reproductive/Obstetrics                             Anesthesia Physical Anesthesia Plan  ASA: 3  Anesthesia Plan: General   Post-op Pain Management:    Induction: Intravenous  PONV Risk Score and Plan: Propofol infusion  Airway Management Planned: Nasal Cannula and Natural Airway  Additional Equipment:   Intra-op Plan:   Post-operative Plan:   Informed Consent: I have reviewed the patients History and Physical, chart, labs and discussed the procedure including the risks, benefits and alternatives for the proposed anesthesia with the patient or authorized representative who has indicated his/her understanding and acceptance.       Plan Discussed with: CRNA and Surgeon  Anesthesia Plan Comments:         Anesthesia Quick Evaluation

## 2021-02-22 NOTE — Op Note (Signed)
[facility] Patient Name: Danny Greene Procedure Date: 02/22/2021 2:23 PM MRN: [id number] Date of Birth: [birth year] Attending MD: Hildred Laser , MD CSN: [id number] Age: 60 Admit Type: Inpatient Procedure:                Upper GI endoscopy Indications:              Epigastric abdominal pain, Nausea with vomiting Providers:                Hildred Laser, MD, [place]is [place] B. Sharon Seller, RN, [place]                            Page Referring MD:             Irwin Brakeman, MD Medicines:                Propofol per Anesthesia Complications:            No immediate complications. Estimated Blood Loss:     Estimated blood loss was minimal. Procedure:                Pre-Anesthesia Assessment:                           - Prior to the procedure, a History and Physical                            was performed, and patient medications and                            allergies were reviewed. The patient's tolerance of                            previous anesthesia was also reviewed. The risks                            and benefits of the procedure and the sedation                            options and risks were discussed with the patient.                            All questions were answered, and informed consent                            was obtained. Prior Anticoagulants: The patient has                            taken no previous anticoagulant or antiplatelet                            agents. ASA Grade Assessment: III - A patient with                            severe systemic disease. After reviewing the risks  and benefits, the patient was deemed in                            satisfactory condition to undergo the procedure.                           After obtaining informed consent, the endoscope was                            passed under direct vision. Throughout the                            procedure, the patient's blood pressure, pulse, and                             oxygen saturations were monitored continuously. The                            [id number]-[id number] ([id number]) scope was introduced through the                            mouth, and advanced to the second part of duodenum.                            The upper GI endoscopy was accomplished without                            difficulty. The patient tolerated the procedure                            well. Scope In: 2:43:32 PM Scope Out: 2:53:32 PM Total Procedure Duration: 0 hours 10 minutes 0 seconds  Findings:      The hypopharynx was normal.      The proximal esophagus was normal.      LA Grade D (one or more mucosal breaks involving at least 75% of       esophageal circumference) esophagitis with no bleeding was found 25 to       38 cm from the incisors. Biopsies were taken with a cold forceps for       histology. The pathology specimen was placed into Bottle Number 2.      The Z-line was regular and was found 38 cm from the incisors with focal       nodularity. Biopsies were taken with a cold forceps for histology. The       pathology specimen was placed into Bottle Number 1.      A 6 cm hiatal hernia was present.      The entire examined stomach was normal.      The duodenal bulb and second portion of the duodenum were normal.      Brushing taken from esophageal mucosa for KOH prep. Impression:               - Normal hypopharynx.                           - Normal proximal esophagus.                           -  LA Grade D esophagitis with no bleeding.                            Ulceration extends from 25 to 38 cm from the                            incisors. Biopsied.                           - GE junction at 38 cm from incisors with focal                            nodularity.. Biopsied.                           - Brushing taken from esophageal mucosa offered KOH                            prep                           - 6 cm hiatal hernia.                           - Normal  stomach.                           - Normal duodenal bulb and second portion of the                            duodenum. Moderate Sedation:      Per Anesthesia Care Recommendation:           - Return patient to hospital ward for ongoing care.                           - Full liquid diet today.                           - Continue present medications.                           - Elevate head of bed by 30 degrees at all times.                           - Sucralfate 1 g p.o. before meals and nightly                           - Await pathology results.                           - Gastric emptying study on an outpatient basis. Procedure Code(s):        --- Professional ---                           [phone], Esophagogastroduodenoscopy, flexible,  transoral; with biopsy, single or multiple Diagnosis Code(s):        --- Professional ---                           K20.90, Esophagitis, unspecified without bleeding                           K44.9, Diaphragmatic hernia without obstruction or                            gangrene                           R10.13, Epigastric pain                           R11.2, Nausea with vomiting, unspecified CPT copyright 2019 American [employer]. All rights reserved. The codes documented in this report are preliminary and upon coder review may  be revised to meet current compliance requirements. Hildred Laser, MD Hildred Laser, MD 02/22/2021 3:07:15 PM This report has been signed electronically. Number of Addenda: 0

## 2021-02-23 ENCOUNTER — Telehealth: Payer: Self-pay | Admitting: Gastroenterology

## 2021-02-23 LAB — BASIC METABOLIC PANEL
Anion gap: 5 (ref 5–15)
BUN: 12 mg/dL (ref 6–20)
CO2: 24 mmol/L (ref 22–32)
Calcium: 8 mg/dL — ABNORMAL LOW (ref 8.9–10.3)
Chloride: 108 mmol/L (ref 98–111)
Creatinine, Ser: 0.71 mg/dL (ref 0.61–1.24)
GFR, Estimated: >60 mL/min (ref >60)
Glucose, Bld: 94 mg/dL (ref 70–99)
Potassium: 3.7 mmol/L (ref 3.5–5.1)
Sodium: 137 mmol/L (ref 135–145)

## 2021-02-23 LAB — MAGNESIUM: Magnesium: 1.9 mg/dL (ref 1.7–2.4)

## 2021-02-23 LAB — CBC
HCT: 25.8 % — ABNORMAL LOW (ref 39.0–52.0)
Hemoglobin: 8.6 g/dL — ABNORMAL LOW (ref 13.0–17.0)
MCH: 33.3 pg (ref 26.0–34.0)
MCHC: 33.3 g/dL (ref 30.0–36.0)
MCV: 100 fL (ref 80.0–100.0)
Platelets: 168 10*3/uL (ref 150–400)
RBC: 2.58 MIL/uL — ABNORMAL LOW (ref 4.22–5.81)
RDW: 12 % (ref 11.5–15.5)
WBC: 7.3 10*3/uL (ref 4.0–10.5)
nRBC: 0 % (ref 0.0–0.2)

## 2021-02-23 MED ORDER — GEMFIBROZIL 600 MG PO TABS
600.0000 mg | ORAL_TABLET | Freq: Two times a day (BID) | ORAL | Status: DC
  Administered 2021-02-23 – 2021-02-24 (×2): 600 mg via ORAL
  Filled 2021-02-23 (×2): qty 1

## 2021-02-23 MED ORDER — PANTOPRAZOLE SODIUM 40 MG PO TBEC
40.0000 mg | DELAYED_RELEASE_TABLET | Freq: Two times a day (BID) | ORAL | Status: DC
  Administered 2021-02-23 – 2021-02-24 (×2): 40 mg via ORAL
  Filled 2021-02-23 (×2): qty 1

## 2021-02-23 NOTE — TOC Initial Note (Signed)
Transition of Care [facility]) - Initial/Assessment Note    Patient Details  Name: Danny Greene MRN: [id number] Date of Birth: 1961/04/28  Transition of Care [facility]) CM/SW Contact:    Shade Flood, LCSW Phone Number: 02/23/2021, 11:12 AM  Clinical Narrative:                  Pt resides at [facility] and plan is for return there at dc. Spoke with [facility] owner, [name], today to update on dc planning. MD anticipating dc tomorrow. [name] states that pt can return to the [facility] at dc and he or staff will pick up patient at dc. Per [name], FL2 and DC summary can be included in the dc envelope and do not need to be faxed ahead of time.  TOC will follow up in AM.  Expected Discharge Plan: Rest Home Barriers to Discharge: Continued Medical Work up   Patient Goals and CMS Choice        Expected Discharge Plan and Services Expected Discharge Plan: Rest Home In-house Referral: Clinical Social Work     Living arrangements for the past 2 months: [place]                                      Prior Living Arrangements/Services Living arrangements for the past 2 months: [place] Lives with:: Facility Resident Patient language and need for interpreter reviewed:: Yes Do you feel safe going back to the place where you live?: Yes      Need for Family Participation in Patient Care: No (Comment) Care giver support system in place?: Yes (comment)   Criminal Activity/Legal Involvement Pertinent to Current Situation/Hospitalization: No - Comment as needed  Activities of Daily Living Home Assistive Devices/Equipment: None ADL Screening (condition at time of admission) Patient's cognitive ability adequate to safely complete daily activities?: No Is the patient deaf or have difficulty hearing?: No Does the patient have difficulty seeing, even when wearing glasses/contacts?: No Does the patient have difficulty concentrating, remembering, or making  decisions?: No Patient able to express need for assistance with ADLs?: No Does the patient have difficulty dressing or bathing?: No Independently performs ADLs?: Yes (appropriate for developmental age) Does the patient have difficulty walking or climbing stairs?: No Weakness of Legs: Both Weakness of Arms/Hands: None  Permission Sought/Granted                  Emotional Assessment       Orientation: : Oriented to Self, Oriented to Place, Oriented to  Time, Oriented to Situation Alcohol / Substance Use: Not Applicable Psych Involvement: No (comment)  Admission diagnosis:  Hypokalemia [E87.6] Lactic acidosis [E87.2] Hypomagnesemia [E83.42] Acute kidney injury ([place]) [N17.9] Intractable nausea and vomiting [R11.2] Patient Active Problem List   Diagnosis Date Noted   Protein-calorie malnutrition, severe 02/22/2021   Leukocytosis 02/21/2021   Lactic acidosis 02/21/2021   Hyperglycemia 02/21/2021   Prolonged QT interval 02/21/2021   SIRS (systemic inflammatory response syndrome) ([place]) 02/21/2021   Hypomagnesemia    Intractable nausea and vomiting 02/20/2021   History of colonic polyps 07/19/2017   Anemia 07/19/2017   Abnormal LFTs 07/19/2017   Positive colorectal cancer screening using Cologuard test 03/21/2017   Candida esophagitis ([place]) 02/01/2017   AKI (acute kidney injury) ([place]) 02/01/2017   Nausea & vomiting 01/28/2017   Colon cancer screening 10/30/2016   Hepatitis C antibody test positive 11/01/2015  Class: History of   GERD (gastroesophageal reflux disease)    Dysphagia    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   Intractable vomiting with nausea 06/09/2015   Dehydration 18/33/5825   Periumbilical abdominal pain    Esophagitis 05/26/2015   Schizophrenia, unspecified type ([place]) 05/26/2015   Tobacco abuse 05/26/2015   Abnormal CT scan, esophagus    Hiatal hernia    Reflux esophagitis    Acute esophagitis    Abdominal pain 05/25/2015   Hypokalemia 05/25/2015    PCP:  Lauretta Grill, NP Pharmacy:   [facility], [place] [address] [place] [address] Phone: [phone] Fax: [phone]     Social Determinants of Health (SDOH) Interventions    Readmission Risk Interventions No flowsheet data found.

## 2021-02-23 NOTE — Progress Notes (Signed)
PROGRESS NOTE   Danny Greene  OJJ:[id number] DOB: 1961-07-19 DOA: 02/20/2021 PCP: Lauretta Grill, NP   Chief Complaint  Patient presents with   Emesis   Level of care: Telemetry  Brief Admission History:  60 y.o. male with medical history significant for schizophrenia, GERD, hyperlipidemia who presents to the emergency department due to generalized weakness and abdominal discomfort.  Patient was unable to provide history, history was obtained from ED physician and ED medical record.  Per report, patient has been having nausea and vomiting for about 3 days and has not been able to keep anything down.  Vomitus was nonbloody, patient presents with nonspecific abdominal pain which he was unable to rate.  No reported fever, diarrhea chest pain, fever or chills.   In the emergency department, he was tachypneic and tachycardic, BP was 142/116.  Work-up in the ED shows leukocytosis, normocytic anemia, lactic acidosis, hypokalemia, hyperglycemia, BUN/creatinine 43/1.75 (no recent prior labs for comparison--last creatinine on record was 3 years ago and it was 0.9-1.0).  CT abdomen and pelvis with contrast showed moderate hiatal hernia with wall thickening of the distal esophagus and gastric cardia, suggesting reflux or esophagitis/gastritis.  Chest x-ray showed no acute cardiopulmonary abnormalities.  IV hydration was provided, patient was empirically started on IV antibiotics, Protonix was given.  And potassium was replenished.  Hospitalist was asked to admit patient for further evaluation and management.  Assessment & Plan:   Principal Problem:   Abdominal pain Active Problems:   Hypokalemia   Dehydration   GERD (gastroesophageal reflux disease)   AKI (acute kidney injury) (HCC)   Intractable nausea and vomiting   Leukocytosis   Lactic acidosis   Hyperglycemia   Prolonged QT interval   SIRS (systemic inflammatory response syndrome) (HCC)   Protein-calorie malnutrition, severe  Severe  esophagitis - seen by upper endoscopy on 6/22.  Discussed with Dr. Laural Golden, see recommendations and new orders started.  Continue supportive measures and symptomatic management.    GERD - IV protonix ordered.   Sucralfate ordered by GI team.   AKI - prerenal - treated with IV fluid hydration.   Hypokalemia - IV replacement and corrected.   Lactic acidosis - resolved.   Schizophrenia - chronic on clozapine, resume.    Hyperlipidemia - stable.  Resume home gemfibrozil.   DVT prophylaxis: enoxaparin  Code Status: Full  Family Communication: bedside update  Disposition: home  Status is: Inpatient   Dispo: The patient is from: Home              Anticipated d/c is to: Home              Patient currently is not medically stable to d/c.   Difficult to place patient No    Consultants:  GI   Procedures:  EGD 02/22/21  Antimicrobials:  N/a    Subjective: Pt reporting that his nausea and vomiting seem to be a little better today.  No chest pain symptoms    Objective: Vitals:   02/22/21 1503 02/22/21 1515 02/22/21 1543 02/22/21 2052  BP: 100/71 104/68 98/64 103/67  Pulse:   82 70  Resp: (!) 29 (!) 25 18 18   Temp: 98.2 F (36.8 C) 98.2 F (36.8 C) (!) 97.5 F (36.4 C) 98.1 F (36.7 C)  TempSrc:   Oral Oral  SpO2: 94% 94% 97% 97%  Weight:      Height:        Intake/Output Summary (Last 24 hours) at 02/23/2021 1225 Last data filed  at 02/23/2021 1151 Gross per 24 hour  Intake 1960 ml  Output 1350 ml  Net 610 ml   Filed Weights   02/20/21 1953 02/21/21 1813  Weight: 72.6 kg 67.6 kg    Examination:  General exam: Appears calm and comfortable  Respiratory system: Clear to auscultation. Respiratory effort normal. Cardiovascular system: normal S1 & S2 heard. No JVD, murmurs, rubs, gallops or clicks. No pedal edema. Gastrointestinal system: Abdomen is nondistended, soft and nontender. No organomegaly or masses felt. Normal bowel sounds heard. Central nervous system:  Alert and oriented. No focal neurological deficits. Extremities: Symmetric 5 x 5 power. Skin: No rashes, lesions or ulcers Psychiatry: Judgement and insight appear normal. Mood & affect appropriate.   Data Reviewed: I have personally reviewed following labs and imaging studies  CBC: Recent Labs  Lab 02/20/21 2016 02/20/21 2215 02/21/21 0350 02/22/21 0502 02/22/21 1421 02/23/21 0437  WBC 36.0* 23.0* 21.3* 9.7  --  7.3  NEUTROABS  --  19.2*  --   --   --   --   HGB 14.2 12.2* 11.1* 8.7* 8.5* 8.6*  HCT 40.9 36.0* 32.2* 25.7* 25.0* 25.8*  MCV 96.7 96.5 95.8 99.6  --  100.0  PLT 232 205 193 168  --  469    Basic Metabolic Panel: Recent Labs  Lab 02/20/21 2016 02/20/21 2215 02/21/21 0350 02/22/21 0502 02/22/21 1326 02/22/21 1421 02/23/21 0437  NA 132*  --  133* 139 138 140 137  K 2.1*  --  2.6* 2.9* 4.0 3.7 3.7  CL 79*  --  87* 103 104 104 108  CO2 37*  --  34* 31 27  --  24  GLUCOSE 150*  --  129* 90 89 88 94  BUN 43*  --  34* 13 12 10 12   CREATININE 1.75*  --  1.11 0.74 0.77 0.70 0.71  CALCIUM 9.7  --  8.7* 8.0* 8.1*  --  8.0*  MG  --  1.1* 1.2* 1.9  --   --  1.9  PHOS  --   --  2.9  --   --   --   --     GFR: Estimated Creatinine Clearance: 93.9 mL/min (by C-G formula based on SCr of 0.71 mg/dL).  Liver Function Tests: Recent Labs  Lab 02/20/21 2016 02/21/21 0350  AST 29 21  ALT 13 11  ALKPHOS 129* 94  BILITOT 1.0 0.7  PROT 8.2* 6.1*  ALBUMIN 4.3 3.2*    CBG: No results for input(s): GLUCAP in the last 168 hours.  Recent Results (from the past 240 hour(s))  Resp Panel by RT-PCR (Flu A&B, Covid) Nasopharyngeal Swab     Status: None   Collection Time: 02/20/21  8:30 PM   Specimen: Nasopharyngeal Swab; Nasopharyngeal(NP) swabs in vial transport medium  Result Value Ref Range Status   SARS Coronavirus 2 by RT PCR NEGATIVE NEGATIVE Final    Comment: (NOTE) SARS-CoV-2 target nucleic acids are NOT DETECTED.  The SARS-CoV-2 RNA is generally detectable in  upper respiratory specimens during the acute phase of infection. The lowest concentration of SARS-CoV-2 viral copies this assay can detect is 138 copies/mL. A negative result does not preclude SARS-Cov-2 infection and should not be used as the sole basis for treatment or other patient management decisions. A negative result may occur with  improper specimen collection/handling, submission of specimen other than nasopharyngeal swab, presence of viral mutation(s) within the areas targeted by this assay, and inadequate number of viral copies(<138 copies/mL).  A negative result must be combined with clinical observations, patient history, and epidemiological information. The expected result is Negative.  Fact Sheet for Patients:  EntrepreneurPulse.com.au  Fact Sheet for Healthcare Providers:  IncredibleEmployment.be  This test is no t yet approved or cleared by the [place] FDA and  has been authorized for detection and/or diagnosis of SARS-CoV-2 by FDA under an Emergency Use Authorization (EUA). This EUA will remain  in effect (meaning this test can be used) for the duration of the COVID-19 declaration under Section 564(b)(1) of the Act, 21 U.S.C.section 360bbb-3(b)(1), unless the authorization is terminated  or revoked sooner.       Influenza A by PCR NEGATIVE NEGATIVE Final   Influenza B by PCR NEGATIVE NEGATIVE Final    Comment: (NOTE) The Xpert Xpress SARS-CoV-2/FLU/RSV plus assay is intended as an aid in the diagnosis of influenza from Nasopharyngeal swab specimens and should not be used as a sole basis for treatment. Nasal washings and aspirates are unacceptable for Xpert Xpress SARS-CoV-2/FLU/RSV testing.  Fact Sheet for Patients: EntrepreneurPulse.com.au  Fact Sheet for Healthcare Providers: IncredibleEmployment.be  This test is not yet approved or cleared by the [place] FDA and has been  authorized for detection and/or diagnosis of SARS-CoV-2 by FDA under an Emergency Use Authorization (EUA). This EUA will remain in effect (meaning this test can be used) for the duration of the COVID-19 declaration under Section 564(b)(1) of the Act, 21 U.S.C. section 360bbb-3(b)(1), unless the authorization is terminated or revoked.  Performed at [facility], [address] [place], [place] [address]   Culture, blood (routine x 2)     Status: None (Preliminary result)   Collection Time: 02/20/21 10:15 PM   Specimen: BLOOD  Result Value Ref Range Status   Specimen Description BLOOD LEFT ANTECUBITAL  Final   Special Requests   Final    Blood Culture adequate volume BOTTLES DRAWN AEROBIC AND ANAEROBIC   Culture   Final    NO GROWTH < 12 HOURS Performed at [facility], [address], [place], [place] [address]    Report Status PENDING  Incomplete  Culture, blood (routine x 2)     Status: None (Preliminary result)   Collection Time: 02/20/21 10:21 PM   Specimen: BLOOD  Result Value Ref Range Status   Specimen Description BLOOD BLOOD LEFT HAND  Final   Special Requests   Final    Blood Culture adequate volume BOTTLES DRAWN AEROBIC AND ANAEROBIC   Culture   Final    NO GROWTH < 12 HOURS Performed at [facility], [address] [place], [place] [address]    Report Status PENDING  Incomplete  KOH prep     Status: None   Collection Time: 02/22/21  2:51 PM   Specimen: PATH GI Other  Result Value Ref Range Status   Specimen Description ESOPHAGUS  Final   Special Requests NONE  Final   KOH Prep   Final    NO YEAST OR FUNGAL ELEMENTS SEEN Performed at [facility], [address] [place], [place] [address]    Report Status 02/22/2021 FINAL  Final     Radiology Studies: No results found.  Scheduled Meds:  (feeding supplement) PROSource Plus  30 mL Oral TID BM   cholecalciferol  2,000 Units Oral Daily   clozapine  200 mg Oral QHS   enoxaparin (LOVENOX) injection  40 mg  Subcutaneous Q24H   feeding supplement  1 Container Oral TID BM   feeding supplement  237  mL Oral TID BM   fluvoxaMINE  100 mg Oral QHS   multivitamin with minerals  1 tablet Oral Daily   pantoprazole (PROTONIX) IV  40 mg Intravenous Q12H   sucralfate  1 g Oral TID WC & HS   vitamin B-12  100 mcg Oral Daily   Continuous Infusions:  0.9 % NaCl with KCl 40 mEq / L 75 mL/hr at 02/22/21 2220     LOS: 3 days   Time spent: 35 mins   Dontae Minerva Wynetta Emery, MD How to contact the [facility] Attending or Consulting provider [place] or covering provider during after hours [place], for this patient?  Check the care team in [facility] and look for a) attending/consulting [facility] provider listed and b) the [facility] team listed Log into [email or web address] and use [place]'s universal password to access. If you do not have the password, please contact the hospital operator. Locate the [facility] provider you are looking for under [facility] and page to a number that you can be directly reached. If you still have difficulty reaching the provider, please page the [facility] (Director on Call) for the Hospitalists listed on amion for assistance.  02/23/2021, 12:25 PM

## 2021-02-23 NOTE — Addendum Note (Signed)
Addendum  created 02/23/21 [id number] by Orlie Dakin, CRNA   Charge Capture section accepted, Visit diagnoses modified

## 2021-02-23 NOTE — Telephone Encounter (Signed)
Danny Greene, please arrange hospital follow-up and 6-8 weeks.  Dx: Ulcerative esophagitis, GERD, nausea/vomiting.

## 2021-02-23 NOTE — Progress Notes (Addendum)
Subjective: Feeling well this morning. No nausea, vomiting, or heartburn at this time. Tolerated his diet well this morning. Denies brbpr, melena, or abdominal pain.   He does tell me prior to onset of acute nausea and vomiting, he was having uncontrolled reflux.  He was taking Protonix once daily prior to admission.  Objective: Vital signs in last 24 hours: Temp:  [97.5 F (36.4 C)-98.3 F (36.8 C)] 98.1 F (36.7 C) (06/22 2052) Pulse Rate:  [64-82] 70 (06/22 2052) Resp:  [18-29] 18 (06/22 2052) BP: (89-104)/(64-71) 103/67 (06/22 2052) SpO2:  [94 %-97 %] 97 % (06/22 2052) Last BM Date: 02/21/21 General:   Alert and oriented, pleasant, NAD Head:  Normocephalic and atraumatic. Eyes:  No icterus, sclera clear.  Abdomen:  Bowel sounds present, soft, non-tender, non-distended. No HSM or hernias noted. No rebound or guarding. No masses appreciated  Msk:  Symmetrical without gross deformities. Normal posture. Extremities:  Without edema. Neurologic:  Alert and  oriented x4 Psych: Normal mood and affect.  Intake/Output from previous day: 06/22 0701 - 06/23 0700 In: 1600 [I.V.:1200; IV Piggyback:400] Out: 325 [Urine:325] Intake/Output this shift: Total I/O In: 360 [P.O.:360] Out: 850 [Urine:850]  Lab Results: Recent Labs    02/21/21 0350 02/22/21 0502 02/22/21 1421 02/23/21 0437  WBC 21.3* 9.7  --  7.3  HGB 11.1* 8.7* 8.5* 8.6*  HCT 32.2* 25.7* 25.0* 25.8*  PLT 193 168  --  168   BMET Recent Labs    02/22/21 0502 02/22/21 1326 02/22/21 1421 02/23/21 0437  NA 139 138 140 137  K 2.9* 4.0 3.7 3.7  CL 103 104 104 108  CO2 31 27  --  24  GLUCOSE 90 89 88 94  BUN 13 12 10 12   CREATININE 0.74 0.77 0.70 0.71  CALCIUM 8.0* 8.1*  --  8.0*   LFT Recent Labs    02/20/21 2016 02/21/21 0350  PROT 8.2* 6.1*  ALBUMIN 4.3 3.2*  AST 29 21  ALT 13 11  ALKPHOS 129* 94  BILITOT 1.0 0.7   PT/INR Recent Labs    02/21/21 0350  LABPROT 14.9  INR 1.2     Studies/Results: CT Angio Chest Pulmonary Embolism (PE) W or WO Contrast  Result Date: 02/21/2021 CLINICAL DATA:  Three days of postprandial nausea and vomiting and chest pain. EXAM: CT ANGIOGRAPHY CHEST WITH CONTRAST TECHNIQUE: Multidetector CT imaging of the chest was performed using the standard protocol during bolus administration of intravenous contrast. Multiplanar CT image reconstructions and MIPs were obtained to evaluate the vascular anatomy. CONTRAST:  160mL OMNIPAQUE IOHEXOL 350 MG/ML SOLN COMPARISON:  05/25/2015 FINDINGS: Cardiovascular: The heart is normal in size. No pericardial effusion. The aorta is normal in caliber. No dissection or atherosclerotic calcification. No obvious coronary artery calcifications. The pulmonary arterial tree is fairly well opacified. No filling defects to suggest pulmonary embolism. Mediastinum/Nodes: Diffusely and markedly thickened esophageal wall appears fairly similar to the prior CT scan and it appears the patient has a history of intermittent severe esophagitis. Findings suspicious for recurrent esophagitis. No obvious esophageal mass. There is a small hiatal hernia again noted. No mediastinal or hilar mass or lymphadenopathy. Small scattered lymph nodes are stable. The thyroid gland is grossly normal. Lungs/Pleura: No acute pulmonary findings. No infiltrates, edema or effusions. No worrisome pulmonary lesions or pleural effusions. Upper Abdomen: No significant upper abdominal findings. Musculoskeletal: The bony thorax is intact. Review of the MIP images confirms the above findings. IMPRESSION: 1. No CT findings for pulmonary  embolism. 2. Normal thoracic aorta. 3. Diffusely and markedly thickened esophageal wall likely the severe diffuse esophagitis. 4. No acute pulmonary findings. 5. Aortic atherosclerosis. Aortic Atherosclerosis (ICD10-I70.0). Electronically Signed   By: Marijo Sanes M.D.   On: 02/21/2021 12:32    Assessment: 60 year old male with  history of GERD with documented ulcerative reflux esophagitis and Candida esophagitis on prior EGDs, constipation, HTN, schizophrenia who presented from [facility] with nausea, vomiting, associated upper abdominal pain, and generalized weakness for about 3 days.  He was found to have leukocytosis, hyponatremia, hypokalemia, elevated lactic acid.  CT A/P with moderate hiatal hernia with wall thickening of the distal esophagus and gastric cardia, suggesting reflux or esophagitis/gastritis.  Chest x-ray negative.  He underwent EGD 6/22 which revealed LA grade D esophagitis without bleeding, ulcerations extending from 25 to 38 cm from the incisors s/p biopsies, focal nodularity at GE junction s/p biopsied, brushings taken from esophageal mucosa for KOH prep, 6 cm hiatal hernia, normal examined stomach and duodenum.  KOH prep was negative.  Surgical pathology pending.  Clinically, he is doing very well with no further nausea or vomiting.  Abdominal pain also resolved.  Etiology of nausea/vomiting could be secondary to acute gastroenteritis versus secondary to uncontrolled GERD.  Recommend PPI twice daily, Carafate 4 times daily.  Could consider outpatient gastric emptying study if any persistent uncontrolled GERD, nausea, or vomiting.   Anemia: Hemoglobin 14.2 on admission> 12.2> 11.1> 8.7> 8.5> 8.6 today.  Overall, stable over the last 24 hours.  No overt GI bleeding.  Suspect hemoconcentration on admission secondary to GI losses.  Also with hemodilution this admission as he received IV fluid boluses in the ED and has continued with IV normal saline with potassium this admission.  Hypokalemia/hyponatremia: Corrected with replacement/IV fluids per hospitalist.  Plan: Advance to soft diet. Switch IV PPI to p.o. twice daily. Continue Carafate 1 g p.o. 3 times daily before meals and at bedtime. Follow-up on surgical pathology. GI will sign off and arrange outpatient follow-up. Could  consider outpatient gastric emptying study if persistent reflux/recurrent N/V.     LOS: 3 days    02/23/2021, 11:30 AM   Aliene Altes, [facility]

## 2021-02-24 ENCOUNTER — Encounter: Payer: Self-pay | Admitting: Internal Medicine

## 2021-02-24 LAB — BASIC METABOLIC PANEL
Anion gap: 5 (ref 5–15)
BUN: 9 mg/dL (ref 6–20)
CO2: 23 mmol/L (ref 22–32)
Calcium: 8.3 mg/dL — ABNORMAL LOW (ref 8.9–10.3)
Chloride: 111 mmol/L (ref 98–111)
Creatinine, Ser: 0.55 mg/dL — ABNORMAL LOW (ref 0.61–1.24)
GFR, Estimated: >60 mL/min (ref >60)
Glucose, Bld: 104 mg/dL — ABNORMAL HIGH (ref 70–99)
Potassium: 3.8 mmol/L (ref 3.5–5.1)
Sodium: 139 mmol/L (ref 135–145)

## 2021-02-24 LAB — CBC
HCT: 27.7 % — ABNORMAL LOW (ref 39.0–52.0)
Hemoglobin: 9.4 g/dL — ABNORMAL LOW (ref 13.0–17.0)
MCH: 33.3 pg (ref 26.0–34.0)
MCHC: 33.9 g/dL (ref 30.0–36.0)
MCV: 98.2 fL (ref 80.0–100.0)
Platelets: 182 10*3/uL (ref 150–400)
RBC: 2.82 MIL/uL — ABNORMAL LOW (ref 4.22–5.81)
RDW: 12 % (ref 11.5–15.5)
WBC: 6.9 10*3/uL (ref 4.0–10.5)
nRBC: 0 % (ref 0.0–0.2)

## 2021-02-24 LAB — MAGNESIUM: Magnesium: 1.6 mg/dL — ABNORMAL LOW (ref 1.7–2.4)

## 2021-02-24 MED ORDER — ADULT MULTIVITAMIN W/MINERALS CH: 1.0000 | ORAL_TABLET | Freq: Every day | ORAL | Status: DC

## 2021-02-24 MED ORDER — MAGNESIUM SULFATE 4 GM/100ML IV SOLN
4.0000 g | Freq: Once | INTRAVENOUS | Status: AC
  Administered 2021-02-24: 4 g via INTRAVENOUS
  Filled 2021-02-24: qty 100

## 2021-02-24 MED ORDER — SUCRALFATE 1 G PO TABS: 1.0000 g | ORAL_TABLET | Freq: Three times a day (TID) | ORAL | 1 refills | Status: AC

## 2021-02-24 MED ORDER — PANTOPRAZOLE SODIUM 40 MG PO TBEC: 40.0000 mg | DELAYED_RELEASE_TABLET | Freq: Two times a day (BID) | ORAL | 1 refills | Status: AC

## 2021-02-24 NOTE — Discharge Instructions (Signed)
IMPORTANT INFORMATION: PAY CLOSE ATTENTION   PHYSICIAN DISCHARGE INSTRUCTIONS  Follow with Primary care provider  Lauretta Grill, NP  and other consultants as instructed by your Hospitalist Physician  [place] IF SYMPTOMS COME BACK, WORSEN OR NEW PROBLEM DEVELOPS   Please note: You were cared for by a hospitalist during your hospital stay. Every effort will be made to forward records to your primary care provider.  You can request that your primary care provider send for your hospital records if they have not received them.  Once you are discharged, your primary care physician will handle any further medical issues. Please note that NO REFILLS for any discharge medications will be authorized once you are discharged, as it is imperative that you return to your primary care physician (or establish a relationship with a primary care physician if you do not have one) for your post hospital discharge needs so that they can reassess your need for medications and monitor your lab values.  Please get a complete blood count and chemistry panel checked by your Primary MD at your next visit, and again as instructed by your Primary MD.  Get Medicines reviewed and adjusted: Please take all your medications with you for your next visit with your Primary MD  Laboratory/radiological data: Please request your Primary MD to go over all hospital tests and procedure/radiological results at the follow up, please ask your primary care provider to get all Hospital records sent to his/her office.  In some cases, they will be blood work, cultures and biopsy results pending at the time of your discharge. Please request that your primary care provider follow up on these results.  If you are diabetic, please bring your blood sugar readings with you to your follow up appointment with primary care.    Please call and make your follow up appointments as soon as possible.    Also Note  the following: If you experience worsening of your admission symptoms, develop shortness of breath, life threatening emergency, suicidal or homicidal thoughts you must seek medical attention immediately by calling 911 or calling your MD immediately  if symptoms less severe.  You must read complete instructions/literature along with all the possible adverse reactions/side effects for all the Medicines you take and that have been prescribed to you. Take any new Medicines after you have completely understood and accpet all the possible adverse reactions/side effects.   Do not drive when taking Pain medications or sleeping medications (Benzodiazepines)  Do not take more than prescribed Pain, Sleep and Anxiety Medications. It is not advisable to combine anxiety,sleep and pain medications without talking with your primary care practitioner  Special Instructions: If you have smoked or chewed Tobacco  in the last 2 yrs please stop smoking, stop any regular Alcohol  and or any Recreational drug use.  Wear Seat belts while driving.  Do not drive if taking any narcotic, mind altering or controlled substances or recreational drugs or alcohol.

## 2021-02-24 NOTE — Plan of Care (Signed)
  Problem: Education: Goal: Knowledge of General Education information will improve Description: Including pain rating scale, medication(s)/side effects and non-pharmacologic comfort measures 02/24/2021 1053 by Melony Overly, RN Outcome: Adequate for Discharge 02/24/2021 1052 by Melony Overly, RN Outcome: Adequate for Discharge   Problem: Health Behavior/Discharge Planning: Goal: Ability to manage health-related needs will improve 02/24/2021 1053 by Melony Overly, RN Outcome: Adequate for Discharge 02/24/2021 1052 by Melony Overly, RN Outcome: Adequate for Discharge   Problem: Clinical Measurements: Goal: Ability to maintain clinical measurements within normal limits will improve 02/24/2021 1053 by Melony Overly, RN Outcome: Adequate for Discharge 02/24/2021 1052 by Melony Overly, RN Outcome: Adequate for Discharge Goal: Will remain free from infection 02/24/2021 1053 by Melony Overly, RN Outcome: Adequate for Discharge 02/24/2021 1052 by Melony Overly, RN Outcome: Adequate for Discharge Goal: Diagnostic test results will improve 02/24/2021 1053 by Melony Overly, RN Outcome: Adequate for Discharge 02/24/2021 1052 by Melony Overly, RN Outcome: Adequate for Discharge Goal: Respiratory complications will improve 02/24/2021 1053 by Melony Overly, RN Outcome: Adequate for Discharge 02/24/2021 1052 by Melony Overly, RN Outcome: Adequate for Discharge Goal: Cardiovascular complication will be avoided 02/24/2021 1053 by Melony Overly, RN Outcome: Adequate for Discharge 02/24/2021 1052 by Melony Overly, RN Outcome: Adequate for Discharge   Problem: Activity: Goal: Risk for activity intolerance will decrease 02/24/2021 1053 by Melony Overly, RN Outcome: Adequate for Discharge 02/24/2021 1052 by Melony Overly, RN Outcome: Adequate for Discharge   Problem: Nutrition: Goal: Adequate nutrition will be maintained 02/24/2021 1053 by Melony Overly, RN Outcome:  Adequate for Discharge 02/24/2021 1052 by Melony Overly, RN Outcome: Adequate for Discharge   Problem: Coping: Goal: Level of anxiety will decrease 02/24/2021 1053 by Melony Overly, RN Outcome: Adequate for Discharge 02/24/2021 1052 by Melony Overly, RN Outcome: Adequate for Discharge   Problem: Elimination: Goal: Will not experience complications related to bowel motility 02/24/2021 1053 by Melony Overly, RN Outcome: Adequate for Discharge 02/24/2021 1052 by Melony Overly, RN Outcome: Adequate for Discharge Goal: Will not experience complications related to urinary retention 02/24/2021 1053 by Melony Overly, RN Outcome: Adequate for Discharge 02/24/2021 1052 by Melony Overly, RN Outcome: Adequate for Discharge   Problem: Pain Managment: Goal: General experience of comfort will improve 02/24/2021 1053 by Melony Overly, RN Outcome: Adequate for Discharge 02/24/2021 1052 by Melony Overly, RN Outcome: Adequate for Discharge   Problem: Safety: Goal: Ability to remain free from injury will improve 02/24/2021 1053 by Melony Overly, RN Outcome: Adequate for Discharge 02/24/2021 1052 by Melony Overly, RN Outcome: Adequate for Discharge   Problem: Skin Integrity: Goal: Risk for impaired skin integrity will decrease 02/24/2021 1053 by Melony Overly, RN Outcome: Adequate for Discharge 02/24/2021 1052 by Melony Overly, RN Outcome: Adequate for Discharge

## 2021-02-24 NOTE — Discharge Summary (Addendum)
Physician Discharge Summary  Danny Greene VZC:[id number] DOB: 07-19-1961 DOA: 02/20/2021  PCP: Lauretta Grill, NP GI: Dr. Kyung Bacca GI   Admit date: 02/20/2021 Discharge date: 02/24/2021  Admitted From: Group Home  Disposition: same   Recommendations for Outpatient Follow-up:  Follow up with PCP in 1 weeks Follow up with Dr. Laural Golden GI in 6 weeks.   Discharge Condition: STABLE   CODE STATUS: FULL DIET: soft foods, advance as tolerated   Brief Hospitalization Summary: Please see all hospital notes, images, labs for full details of the hospitalization. ADMISSION HPI: Danny Greene is a 60 y.o. male with medical history significant for schizophrenia, GERD, hyperlipidemia who presents to the emergency department due to generalized weakness and abdominal discomfort.  Patient was unable to provide history, history was obtained from ED physician and ED medical record.  Per report, patient has been having nausea and vomiting for about 3 days and has not been able to keep anything down.  Vomitus was nonbloody, patient presents with nonspecific abdominal pain which he was unable to rate.  No reported fever, diarrhea chest pain, fever or chills.   ED Course:  In the emergency department, he was tachypneic and tachycardic, BP was 142/116.  Work-up in the ED shows leukocytosis, normocytic anemia, lactic acidosis, hypokalemia, hyperglycemia, BUN/creatinine 43/1.75 (no recent prior labs for comparison--last creatinine on record was 3 years ago and it was 0.9-1.0).CT abdomen and pelvis with contrast showed moderate hiatal hernia with wall thickening of the distal esophagus and gastric cardia, suggesting reflux or esophagitis/gastritis Chest x-ray showed no acute cardiopulmonary abnormalities IV hydration was provided, patient was empirically started on IV antibiotics, Protonix was given.  And potassium was replenished.  Hospitalist was asked to admit patient for further evaluation and  management.  HOSPITAL COURSE BY PROBLEM  Severe esophagitis - seen by upper endoscopy on 6/22.  Discussed with Dr. Laural Golden, protonix 40 mg BID and sucralfate recommended.  Pt is tolerating diet and having no further nausea and vomiting.  He will need to follow up with GI in 6 weeks with Dr. Laural Golden.        GERD - IV protonix given in hospital and will discharge home on oral protonix 40 mg BID.   Sucralfate ordered by GI team 4 times per day with meals.     AKI - prerenal - treated with IV fluid hydration. RESOLVED.    Hypokalemia - IV replacement and corrected.   REPLETED.    Hypomagnesemia - IV replacement given.      Lactic acidosis - resolved.   Schizophrenia - chronic on clozapine, resume.     Hyperlipidemia - stable.  Resume home gemfibrozil.   DVT prophylaxis: enoxaparin Code Status: Full Family Communication: bedside update Disposition: home Status is: Inpatient Discharge Diagnoses:  Principal Problem:   Abdominal pain Active Problems:   Hypokalemia   Dehydration   GERD (gastroesophageal reflux disease)   AKI (acute kidney injury) (HCC)   Intractable nausea and vomiting   Leukocytosis   Lactic acidosis   Hyperglycemia   Prolonged QT interval   SIRS (systemic inflammatory response syndrome) (HCC)   Protein-calorie malnutrition, severe   Discharge Instructions:  Allergies as of 02/24/2021   No Known Allergies      Medication List     TAKE these medications    clozapine 200 MG tablet Commonly known as: CLOZARIL Take 200 mg by mouth at bedtime.   fluvoxaMINE 100 MG tablet Commonly known as: LUVOX Take 100 mg by mouth at bedtime.  sleep   gemfibrozil 600 MG tablet Commonly known as: LOPID Take 600 mg by mouth 2 (two) times daily before a meal.   multivitamin with minerals Tabs tablet Take 1 tablet by mouth daily. Start taking on: February 25, 2021   pantoprazole 40 MG tablet Commonly known as: PROTONIX Take 1 tablet (40 mg total) by mouth 2 (two)  times daily. What changed: when to take this   sucralfate 1 g tablet Commonly known as: Carafate Take 1 tablet (1 g total) by mouth 4 (four) times daily -  with meals and at bedtime.   vitamin B-12 100 MCG tablet Commonly known as: CYANOCOBALAMIN Take 100 mcg by mouth daily.   Vitamin D 50 MCG (2000 UT) Caps Take 1 capsule by mouth daily.        Follow-up Information     Rehman, Mechele Dawley, MD. Schedule an appointment as soon as possible for a visit in 6 week(s).   Specialty: Gastroenterology Why: Hospital Follow Up Contact information: [address], [address] [place] [address] [phone]         Hatchett, Cope, NP. Schedule an appointment as soon as possible for a visit in 1 week(s).   Specialty: Nurse Practitioner Why: Hospital Follow Up Contact information: [address] [address] [place] [address] [phone]                No Known Allergies Allergies as of 02/24/2021   No Known Allergies      Medication List     TAKE these medications    clozapine 200 MG tablet Commonly known as: CLOZARIL Take 200 mg by mouth at bedtime.   fluvoxaMINE 100 MG tablet Commonly known as: LUVOX Take 100 mg by mouth at bedtime. sleep   gemfibrozil 600 MG tablet Commonly known as: LOPID Take 600 mg by mouth 2 (two) times daily before a meal.   multivitamin with minerals Tabs tablet Take 1 tablet by mouth daily. Start taking on: February 25, 2021   pantoprazole 40 MG tablet Commonly known as: PROTONIX Take 1 tablet (40 mg total) by mouth 2 (two) times daily. What changed: when to take this   sucralfate 1 g tablet Commonly known as: Carafate Take 1 tablet (1 g total) by mouth 4 (four) times daily -  with meals and at bedtime.   vitamin B-12 100 MCG tablet Commonly known as: CYANOCOBALAMIN Take 100 mcg by mouth daily.   Vitamin D 50 MCG (2000 UT) Caps Take 1 capsule by mouth daily.        Procedures/Studies: CT Angio Chest Pulmonary Embolism (PE) W or  WO Contrast  Result Date: 02/21/2021 CLINICAL DATA:  Three days of postprandial nausea and vomiting and chest pain. EXAM: CT ANGIOGRAPHY CHEST WITH CONTRAST TECHNIQUE: Multidetector CT imaging of the chest was performed using the standard protocol during bolus administration of intravenous contrast. Multiplanar CT image reconstructions and MIPs were obtained to evaluate the vascular anatomy. CONTRAST:  169mL OMNIPAQUE IOHEXOL 350 MG/ML SOLN COMPARISON:  05/25/2015 FINDINGS: Cardiovascular: The heart is normal in size. No pericardial effusion. The aorta is normal in caliber. No dissection or atherosclerotic calcification. No obvious coronary artery calcifications. The pulmonary arterial tree is fairly well opacified. No filling defects to suggest pulmonary embolism. Mediastinum/Nodes: Diffusely and markedly thickened esophageal wall appears fairly similar to the prior CT scan and it appears the patient has a history of intermittent severe esophagitis. Findings suspicious for recurrent esophagitis. No obvious esophageal mass. There is a small hiatal hernia  again noted. No mediastinal or hilar mass or lymphadenopathy. Small scattered lymph nodes are stable. The thyroid gland is grossly normal. Lungs/Pleura: No acute pulmonary findings. No infiltrates, edema or effusions. No worrisome pulmonary lesions or pleural effusions. Upper Abdomen: No significant upper abdominal findings. Musculoskeletal: The bony thorax is intact. Review of the MIP images confirms the above findings. IMPRESSION: 1. No CT findings for pulmonary embolism. 2. Normal thoracic aorta. 3. Diffusely and markedly thickened esophageal wall likely the severe diffuse esophagitis. 4. No acute pulmonary findings. 5. Aortic atherosclerosis. Aortic Atherosclerosis (ICD10-I70.0). Electronically Signed   By: Marijo Sanes M.D.   On: 02/21/2021 12:32   CT ABDOMEN PELVIS W CONTRAST  Result Date: 02/20/2021 CLINICAL DATA:  Nausea and vomiting. EXAM: CT ABDOMEN  AND PELVIS WITH CONTRAST TECHNIQUE: Multidetector CT imaging of the abdomen and pelvis was performed using the standard protocol following bolus administration of intravenous contrast. CONTRAST:  26mL OMNIPAQUE IOHEXOL 300 MG/ML  SOLN COMPARISON:  CT 01/28/2017 FINDINGS: Lower chest: Moderate hiatal hernia with wall thickening of the distal esophagus. No pleural effusion or focal airspace disease. Hepatobiliary: Diffusely decreased hepatic density typical of steatosis. No focal hepatic abnormality. Gallbladder physiologically distended, no calcified stone. No biliary dilatation. Pancreas: No ductal dilatation or inflammation. Spleen: Normal in size without focal abnormality. Adrenals/Urinary Tract: Normal adrenal glands. No hydronephrosis or perinephric edema. Homogeneous renal enhancement with symmetric excretion on delayed phase imaging. Subcentimeter low-density in the posterior lower right kidney is too small to characterize but likely cyst. No renal calculi. Urinary bladder is physiologically distended without wall thickening. Stomach/Bowel: Moderate hiatal hernia with wall thickening of the distal esophagus. There is wall thickening of the gastric cardia. Occasional fluid-filled small bowel without obstruction or inflammation. Normal appendix. Small volume of colonic stool. Sigmoid colon is redundant. Areas of submucosal fatty infiltration in the colon again seen. There is no colonic inflammation. Vascular/Lymphatic: Mild aortic atherosclerosis. No aortic aneurysm. Patent portal vein. Circumaortic left renal vein. No enlarged lymph nodes in the abdomen or pelvis. Reproductive: Prominent prostate gland spans 5 cm transverse. Other: Fat containing left inguinal hernia.  No ascites or free air. Musculoskeletal: Chronic L1 superior endplate compression fracture. Bone islands in the right proximal femur again seen. IMPRESSION: 1. Moderate hiatal hernia with wall thickening of the distal esophagus and gastric  cardia, suggesting reflux or esophagitis/gastritis. 2. Hepatic steatosis. 3. Small fat containing left inguinal hernia. Aortic Atherosclerosis (ICD10-I70.0). Electronically Signed   By: Keith Rake M.D.   On: 02/20/2021 22:14   DG Chest Portable 1 View  Result Date: 02/20/2021 CLINICAL DATA:  Vomiting.  Dyspnea. EXAM: PORTABLE CHEST 1 VIEW COMPARISON:  01/28/2017 FINDINGS: Heart size normal. No pleural effusion or edema. No airspace opacities identified. Visualized osseous structures are intact. IMPRESSION: No acute cardiopulmonary abnormalities. Electronically Signed   By: Kerby Moors M.D.   On: 02/20/2021 20:53     Subjective: Pt reports feeling better, he is eating and drinking well.  He really wants to go home.   Discharge Exam: Vitals:   02/23/21 1357 02/23/21 1940  BP: 103/72 100/80  Pulse: 77 78  Resp: 16 20  Temp: 97.6 F (36.4 C) 98.5 F (36.9 C)  SpO2: 97% 98%   Vitals:   02/22/21 1543 02/22/21 2052 02/23/21 1357 02/23/21 1940  BP: 98/64 103/67 103/72 100/80  Pulse: 82 70 77 78  Resp: 18 18 16 20   Temp: (!) 97.5 F (36.4 C) 98.1 F (36.7 C) 97.6 F (36.4 C) 98.5 F (36.9 C)  TempSrc: Oral Oral Oral   SpO2: 97% 97% 97% 98%  Weight:      Height:       General: Pt is alert, awake, not in acute distress Cardiovascular: normal S1/S2 +, no rubs, no gallops Respiratory: CTA bilaterally, no wheezing, no rhonchi Abdominal: Soft, NT, ND, bowel sounds + Extremities: no edema, no cyanosis   The results of significant diagnostics from this hospitalization (including imaging, microbiology, ancillary and laboratory) are listed below for reference.     Microbiology: Recent Results (from the past 240 hour(s))  Resp Panel by RT-PCR (Flu A&B, Covid) Nasopharyngeal Swab     Status: None   Collection Time: 02/20/21  8:30 PM   Specimen: Nasopharyngeal Swab; Nasopharyngeal(NP) swabs in vial transport medium  Result Value Ref Range Status   SARS Coronavirus 2 by RT PCR  NEGATIVE NEGATIVE Final    Comment: (NOTE) SARS-CoV-2 target nucleic acids are NOT DETECTED.  The SARS-CoV-2 RNA is generally detectable in upper respiratory specimens during the acute phase of infection. The lowest concentration of SARS-CoV-2 viral copies this assay can detect is 138 copies/mL. A negative result does not preclude SARS-Cov-2 infection and should not be used as the sole basis for treatment or other patient management decisions. A negative result may occur with  improper specimen collection/handling, submission of specimen other than nasopharyngeal swab, presence of viral mutation(s) within the areas targeted by this assay, and inadequate number of viral copies(<138 copies/mL). A negative result must be combined with clinical observations, patient history, and epidemiological information. The expected result is Negative.  Fact Sheet for Patients:  EntrepreneurPulse.com.au  Fact Sheet for Healthcare Providers:  IncredibleEmployment.be  This test is no t yet approved or cleared by the [place] FDA and  has been authorized for detection and/or diagnosis of SARS-CoV-2 by FDA under an Emergency Use Authorization (EUA). This EUA will remain  in effect (meaning this test can be used) for the duration of the COVID-19 declaration under Section 564(b)(1) of the Act, 21 U.S.C.section 360bbb-3(b)(1), unless the authorization is terminated  or revoked sooner.       Influenza A by PCR NEGATIVE NEGATIVE Final   Influenza B by PCR NEGATIVE NEGATIVE Final    Comment: (NOTE) The Xpert Xpress SARS-CoV-2/FLU/RSV plus assay is intended as an aid in the diagnosis of influenza from Nasopharyngeal swab specimens and should not be used as a sole basis for treatment. Nasal washings and aspirates are unacceptable for Xpert Xpress SARS-CoV-2/FLU/RSV testing.  Fact Sheet for Patients: EntrepreneurPulse.com.au  Fact Sheet for  Healthcare Providers: IncredibleEmployment.be  This test is not yet approved or cleared by the [place] FDA and has been authorized for detection and/or diagnosis of SARS-CoV-2 by FDA under an Emergency Use Authorization (EUA). This EUA will remain in effect (meaning this test can be used) for the duration of the COVID-19 declaration under Section 564(b)(1) of the Act, 21 U.S.C. section 360bbb-3(b)(1), unless the authorization is terminated or revoked.  Performed at [facility], [address], [place], [place] [address]   Culture, blood (routine x 2)     Status: None (Preliminary result)   Collection Time: 02/20/21 10:15 PM   Specimen: BLOOD  Result Value Ref Range Status   Specimen Description BLOOD LEFT ANTECUBITAL  Final   Special Requests   Final    Blood Culture adequate volume BOTTLES DRAWN AEROBIC AND ANAEROBIC   Culture   Final    NO GROWTH 4 DAYS Performed at [facility], [address], [place],  [place] [address]    Report Status PENDING  Incomplete  Culture, blood (routine x 2)     Status: None (Preliminary result)   Collection Time: 02/20/21 10:21 PM   Specimen: BLOOD  Result Value Ref Range Status   Specimen Description BLOOD BLOOD LEFT HAND  Final   Special Requests   Final    Blood Culture adequate volume BOTTLES DRAWN AEROBIC AND ANAEROBIC   Culture   Final    NO GROWTH 4 DAYS Performed at [facility], [address], [place], [place] [address]    Report Status PENDING  Incomplete  KOH prep     Status: None   Collection Time: 02/22/21  2:51 PM   Specimen: PATH GI Other  Result Value Ref Range Status   Specimen Description ESOPHAGUS  Final   Special Requests NONE  Final   KOH Prep   Final    NO YEAST OR FUNGAL ELEMENTS SEEN Performed at [facility], [address] [place], [place] [address]    Report Status 02/22/2021 FINAL  Final     Labs: BNP (last 3 results) No results for input(s): BNP in the last 8760 hours. Basic  Metabolic Panel: Recent Labs  Lab 02/20/21 2215 02/21/21 0350 02/22/21 0502 02/22/21 1326 02/22/21 1421 02/23/21 0437 02/24/21 0552  NA  --  133* 139 138 140 137 139  K  --  2.6* 2.9* 4.0 3.7 3.7 3.8  CL  --  87* 103 104 104 108 111  CO2  --  34* 31 27  --  24 23  GLUCOSE  --  129* 90 89 88 94 104*  BUN  --  34* 13 12 10 12 9   CREATININE  --  1.11 0.74 0.77 0.70 0.71 0.55*  CALCIUM  --  8.7* 8.0* 8.1*  --  8.0* 8.3*  MG 1.1* 1.2* 1.9  --   --  1.9 1.6*  PHOS  --  2.9  --   --   --   --   --    Liver Function Tests: Recent Labs  Lab 02/20/21 2016 02/21/21 0350  AST 29 21  ALT 13 11  ALKPHOS 129* 94  BILITOT 1.0 0.7  PROT 8.2* 6.1*  ALBUMIN 4.3 3.2*   Recent Labs  Lab 02/20/21 2016  LIPASE 22   No results for input(s): AMMONIA in the last 168 hours. CBC: Recent Labs  Lab 02/20/21 2215 02/21/21 0350 02/22/21 0502 02/22/21 1421 02/23/21 0437 02/24/21 0552  WBC 23.0* 21.3* 9.7  --  7.3 6.9  NEUTROABS 19.2*  --   --   --   --   --   HGB 12.2* 11.1* 8.7* 8.5* 8.6* 9.4*  HCT 36.0* 32.2* 25.7* 25.0* 25.8* 27.7*  MCV 96.5 95.8 99.6  --  100.0 98.2  PLT 205 193 168  --  168 182   Cardiac Enzymes: No results for input(s): CKTOTAL, CKMB, CKMBINDEX, TROPONINI in the last 168 hours. BNP: Invalid input(s): POCBNP CBG: No results for input(s): GLUCAP in the last 168 hours. D-Dimer No results for input(s): DDIMER in the last 72 hours. Hgb A1c No results for input(s): HGBA1C in the last 72 hours. Lipid Profile No results for input(s): CHOL, HDL, LDLCALC, TRIG, CHOLHDL, LDLDIRECT in the last 72 hours. Thyroid function studies No results for input(s): TSH, T4TOTAL, T3FREE, THYROIDAB in the last 72 hours.  Invalid input(s): FREET3 Anemia work up No results for input(s): VITAMINB12, FOLATE, FERRITIN, TIBC, IRON, RETICCTPCT in the last 72 hours. Urinalysis  Component Value Date/Time   COLORURINE YELLOW 02/20/2021 1955   APPEARANCEUR HAZY (A) 02/20/2021 1955    APPEARANCEUR Clear 12/24/2014 1603   LABSPEC 1.019 02/20/2021 1955   LABSPEC 1.011 12/24/2014 1603   PHURINE 5.0 02/20/2021 1955   GLUCOSEU 50 (A) 02/20/2021 1955   GLUCOSEU Negative 12/24/2014 1603   HGBUR SMALL (A) 02/20/2021 1955   BILIRUBINUR NEGATIVE 02/20/2021 1955   BILIRUBINUR Negative 12/24/2014 1603   KETONESUR NEGATIVE 02/20/2021 1955   PROTEINUR 30 (A) 02/20/2021 1955   UROBILINOGEN 0.2 06/09/2015 1540   NITRITE NEGATIVE 02/20/2021 1955   LEUKOCYTESUR NEGATIVE 02/20/2021 1955   LEUKOCYTESUR Negative 12/24/2014 1603   Sepsis Labs Invalid input(s): PROCALCITONIN,  WBC,  LACTICIDVEN Microbiology Recent Results (from the past 240 hour(s))  Resp Panel by RT-PCR (Flu A&B, Covid) Nasopharyngeal Swab     Status: None   Collection Time: 02/20/21  8:30 PM   Specimen: Nasopharyngeal Swab; Nasopharyngeal(NP) swabs in vial transport medium  Result Value Ref Range Status   SARS Coronavirus 2 by RT PCR NEGATIVE NEGATIVE Final    Comment: (NOTE) SARS-CoV-2 target nucleic acids are NOT DETECTED.  The SARS-CoV-2 RNA is generally detectable in upper respiratory specimens during the acute phase of infection. The lowest concentration of SARS-CoV-2 viral copies this assay can detect is 138 copies/mL. A negative result does not preclude SARS-Cov-2 infection and should not be used as the sole basis for treatment or other patient management decisions. A negative result may occur with  improper specimen collection/handling, submission of specimen other than nasopharyngeal swab, presence of viral mutation(s) within the areas targeted by this assay, and inadequate number of viral copies(<138 copies/mL). A negative result must be combined with clinical observations, patient history, and epidemiological information. The expected result is Negative.  Fact Sheet for Patients:  EntrepreneurPulse.com.au  Fact Sheet for Healthcare Providers:   IncredibleEmployment.be  This test is no t yet approved or cleared by the [place] FDA and  has been authorized for detection and/or diagnosis of SARS-CoV-2 by FDA under an Emergency Use Authorization (EUA). This EUA will remain  in effect (meaning this test can be used) for the duration of the COVID-19 declaration under Section 564(b)(1) of the Act, 21 U.S.C.section 360bbb-3(b)(1), unless the authorization is terminated  or revoked sooner.       Influenza A by PCR NEGATIVE NEGATIVE Final   Influenza B by PCR NEGATIVE NEGATIVE Final    Comment: (NOTE) The Xpert Xpress SARS-CoV-2/FLU/RSV plus assay is intended as an aid in the diagnosis of influenza from Nasopharyngeal swab specimens and should not be used as a sole basis for treatment. Nasal washings and aspirates are unacceptable for Xpert Xpress SARS-CoV-2/FLU/RSV testing.  Fact Sheet for Patients: EntrepreneurPulse.com.au  Fact Sheet for Healthcare Providers: IncredibleEmployment.be  This test is not yet approved or cleared by the [place] FDA and has been authorized for detection and/or diagnosis of SARS-CoV-2 by FDA under an Emergency Use Authorization (EUA). This EUA will remain in effect (meaning this test can be used) for the duration of the COVID-19 declaration under Section 564(b)(1) of the Act, 21 U.S.C. section 360bbb-3(b)(1), unless the authorization is terminated or revoked.  Performed at [facility], [address] [place], [place] [address]   Culture, blood (routine x 2)     Status: None (Preliminary result)   Collection Time: 02/20/21 10:15 PM   Specimen: BLOOD  Result Value Ref Range Status   Specimen Description BLOOD LEFT ANTECUBITAL  Final   Special Requests   Final  Blood Culture adequate volume BOTTLES DRAWN AEROBIC AND ANAEROBIC   Culture   Final    NO GROWTH 4 DAYS Performed at [facility], [address], [place], [place]  [address]    Report Status PENDING  Incomplete  Culture, blood (routine x 2)     Status: None (Preliminary result)   Collection Time: 02/20/21 10:21 PM   Specimen: BLOOD  Result Value Ref Range Status   Specimen Description BLOOD BLOOD LEFT HAND  Final   Special Requests   Final    Blood Culture adequate volume BOTTLES DRAWN AEROBIC AND ANAEROBIC   Culture   Final    NO GROWTH 4 DAYS Performed at [facility], [address], [place], [place] [address]    Report Status PENDING  Incomplete  KOH prep     Status: None   Collection Time: 02/22/21  2:51 PM   Specimen: PATH GI Other  Result Value Ref Range Status   Specimen Description ESOPHAGUS  Final   Special Requests NONE  Final   KOH Prep   Final    NO YEAST OR FUNGAL ELEMENTS SEEN Performed at [facility], [address] [place], [place] [address]    Report Status 02/22/2021 FINAL  Final   Time coordinating discharge: 33 mins   SIGNED:  Irwin Brakeman, MD  [facility] 02/24/2021, 11:50 AM How to contact the [facility] Attending or Consulting provider [place] or covering provider during after hours [place], for this patient?  Check the care team in [facility] and look for a) attending/consulting [facility] provider listed and b) the [facility] team listed Log into [email or web address] and use [place] universal password to access. If you do not have the password, please contact the hospital operator. Locate the [facility] provider you are looking for under [facility] and page to a number that you can be directly reached. If you still have difficulty reaching the provider, please page the [facility] (Director on Call) for the Hospitalists listed on amion for assistance.

## 2021-02-24 NOTE — NC FL2 (Signed)
[place] LEVEL OF CARE SCREENING TOOL     IDENTIFICATION  Patient Name: Danny Greene Birthdate: 1961-06-04 Sex: male Admission Date (Current Location): 02/20/2021  [facility] and [place] Number:  [employer] and Address:  [place] [address], [place]      Provider Number: [id number]  Attending Physician Name and Address:  Murlean Iba, MD  Relative Name and Phone Number:       Current Level of Care: Hospital Recommended Level of Care: [facility] Prior Approval Number:    Date Approved/Denied:   PASRR Number:    Discharge Plan: Other (Comment) [facility])    Current Diagnoses: Patient Active Problem List   Diagnosis Date Noted   Protein-calorie malnutrition, severe 02/22/2021   Leukocytosis 02/21/2021   Lactic acidosis 02/21/2021   Hyperglycemia 02/21/2021   Prolonged QT interval 02/21/2021   SIRS (systemic inflammatory response syndrome) ([place]) 02/21/2021   Hypomagnesemia    Intractable nausea and vomiting 02/20/2021   History of colonic polyps 07/19/2017   Anemia 07/19/2017   Abnormal LFTs 07/19/2017   Positive colorectal cancer screening using Cologuard test 03/21/2017   Candida esophagitis ([place]) 02/01/2017   AKI (acute kidney injury) ([place]) 02/01/2017   Nausea & vomiting 01/28/2017   Colon cancer screening 10/30/2016   Hepatitis C antibody test positive 11/01/2015   GERD (gastroesophageal reflux disease)    Dysphagia    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   Intractable vomiting with nausea 06/09/2015   Dehydration 93/26/7124   Periumbilical abdominal pain    Esophagitis 05/26/2015   Schizophrenia, unspecified type ([place]) 05/26/2015   Tobacco abuse 05/26/2015   Abnormal CT scan, esophagus    Hiatal hernia    Reflux esophagitis    Acute esophagitis    Abdominal pain 05/25/2015   Hypokalemia 05/25/2015    Orientation RESPIRATION BLADDER Height & Weight     Self, Time, Situation, Place   Normal Incontinent Weight: 149 lb 0.5 oz (67.6 kg) Height:  5\' 11"  (180.3 cm)  BEHAVIORAL SYMPTOMS/MOOD NEUROLOGICAL BOWEL NUTRITION STATUS      Incontinent Diet (regular diet)  AMBULATORY STATUS COMMUNICATION OF NEEDS Skin   Limited Assist Verbally Normal                       Personal Care Assistance Level of Assistance  Bathing, Feeding, Dressing Bathing Assistance: Limited assistance Feeding assistance: Independent Dressing Assistance: Limited assistance     Functional Limitations Info  Sight, Hearing, Speech Sight Info: Adequate Hearing Info: Adequate Speech Info: Adequate    SPECIAL CARE FACTORS FREQUENCY                       Contractures Contractures Info: Not present    Additional Factors Info  Code Status, Allergies Code Status Info: Full Allergies Info: NKA           Current Medications (02/24/2021):  This is the current hospital active medication list Current Facility-Administered Medications  Medication Dose Route Frequency Provider Last Rate Last Admin   (feeding supplement) PROSource Plus liquid 30 mL  30 mL Oral TID BM Johnson, Clanford L, MD   30 mL at 02/24/21 0855   0.9 % NaCl with KCl 40 mEq / L  infusion   Intravenous Continuous Wynetta Emery, Clanford L, MD 75 mL/hr at 02/24/21 0128 New Bag at 02/24/21 0128   cholecalciferol (VITAMIN D3) tablet 2,000 Units  2,000 Units Oral Daily Murlean Iba, MD  2,000 Units at 02/24/21 0855   cloZAPine (CLOZARIL) tablet 200 mg  200 mg Oral QHS Johnson, Clanford L, MD   200 mg at 02/23/21 2205   enoxaparin (LOVENOX) injection 40 mg  40 mg Subcutaneous Q24H Adefeso, Oladapo, DO   40 mg at 02/23/21 2207   feeding supplement (BOOST / RESOURCE BREEZE) liquid 1 Container  1 Container Oral TID BM Johnson, Clanford L, MD   1 Container at 02/24/21 0856   feeding supplement (ENSURE ENLIVE / ENSURE PLUS) liquid 237 mL  237 mL Oral TID BM Johnson, Clanford L, MD   237 mL at 02/24/21 0904   fluvoxaMINE (LUVOX)  tablet 100 mg  100 mg Oral QHS Johnson, Clanford L, MD   100 mg at 02/23/21 2205   gemfibrozil (LOPID) tablet 600 mg  600 mg Oral BID AC Johnson, Clanford L, MD   600 mg at 02/24/21 7902   magnesium sulfate IVPB 4 g 100 mL  4 g Intravenous Once Johnson, Clanford L, MD       multivitamin with minerals tablet 1 tablet  1 tablet Oral Daily Johnson, Clanford L, MD   1 tablet at 02/24/21 0855   pantoprazole (PROTONIX) EC tablet 40 mg  40 mg Oral BID AC Harper, Kristen S, PA-C   40 mg at 02/24/21 0855   prochlorperazine (COMPAZINE) injection 10 mg  10 mg Intravenous Q6H PRN Adefeso, Oladapo, DO   10 mg at 02/21/21 1151   sucralfate (CARAFATE) 1 GM/10ML suspension 1 g  1 g Oral TID WC & HS Rehman, Najeeb U, MD   1 g at 02/24/21 0855   vitamin B-12 (CYANOCOBALAMIN) tablet 100 mcg  100 mcg Oral Daily Johnson, Clanford L, MD   100 mcg at 02/24/21 0855     Discharge Medications:  TAKE these medications     clozapine 200 MG tablet Commonly known as: CLOZARIL Take 200 mg by mouth at bedtime.    fluvoxaMINE 100 MG tablet Commonly known as: LUVOX Take 100 mg by mouth at bedtime. sleep    gemfibrozil 600 MG tablet Commonly known as: LOPID Take 600 mg by mouth 2 (two) times daily before a meal.    multivitamin with minerals Tabs tablet Take 1 tablet by mouth daily. Start taking on: February 25, 2021    pantoprazole 40 MG tablet Commonly known as: PROTONIX Take 1 tablet (40 mg total) by mouth 2 (two) times daily. What changed: when to take this    sucralfate 1 g tablet Commonly known as: Carafate Take 1 tablet (1 g total) by mouth 4 (four) times daily -  with meals and at bedtime.    vitamin B-12 100 MCG tablet Commonly known as: CYANOCOBALAMIN Take 100 mcg by mouth daily.    Vitamin D 50 MCG (2000 UT) Caps Take 1 capsule by mouth daily.      Relevant Imaging Results:  Relevant Lab Results:   Additional Information    Shade Flood, LCSW

## 2021-02-24 NOTE — TOC Transition Note (Signed)
Transition of Care [facility]) - CM/SW Discharge Note   Patient Details  Name: Danny Greene MRN: [id number] Date of Birth: [birth year]  Transition of Care [facility]) CM/SW Contact:  Shade Flood, LCSW Phone Number: 02/24/2021, 12:06 PM   Clinical Narrative:     Pt stable for dc back to his [facility] today per MD. [facility] staff will transport. FL2 and DC summary included in dc packet. There are no other TOC needs for dc.  Final next level of care: Rest Home Barriers to Discharge: Barriers Resolved   Patient Goals and CMS Choice        Discharge Placement                       Discharge Plan and Services In-house Referral: Clinical Social Work                                   Social Determinants of Health (SDOH) Interventions     Readmission Risk Interventions No flowsheet data found.

## 2021-02-25 LAB — CULTURE, BLOOD (ROUTINE X 2)
Culture: NO GROWTH
Culture: NO GROWTH
Special Requests: ADEQUATE
Special Requests: ADEQUATE

## 2021-02-27 ENCOUNTER — Encounter (HOSPITAL_COMMUNITY): Payer: Self-pay | Admitting: Internal Medicine

## 2021-02-28 LAB — SURGICAL PATHOLOGY

## 2021-03-20 ENCOUNTER — Ambulatory Visit (INDEPENDENT_AMBULATORY_CARE_PROVIDER_SITE_OTHER): Payer: Medicaid Other | Admitting: Gastroenterology

## 2021-03-20 ENCOUNTER — Encounter: Payer: Self-pay | Admitting: Gastroenterology

## 2021-03-20 VITALS — BP 89/68 | HR 114 | Temp 97.7°F | Ht 71.0 in | Wt 150.4 lb

## 2021-03-20 DIAGNOSIS — D649 Anemia, unspecified: Secondary | ICD-10-CM | POA: Diagnosis not present

## 2021-03-20 DIAGNOSIS — R112 Nausea with vomiting, unspecified: Secondary | ICD-10-CM | POA: Diagnosis not present

## 2021-03-20 DIAGNOSIS — K2210 Ulcer of esophagus without bleeding: Secondary | ICD-10-CM

## 2021-03-20 NOTE — Progress Notes (Signed)
Primary Care Physician: Lauretta Grill, NP  Primary Gastroenterologist:  Garfield Cornea, MD   Chief Complaint  Patient presents with   Gastroesophageal Reflux    HFU. Trouble swallowing, not able to eat whole meal. When he does try will have gagging episodes    HPI: Danny Greene is a 60 y.o. male here for hospital follow up. He has history significant for GERD with documented ulcerative esophagitis on prior EGDs along with Candida esophagitis. Last colonoscopy 04/2017 with 5 polyps removed, all tubular adenomas, due for colonoscopy 04/2020 (he declined when seen in office 03/2020).   Patient seen in hospital last month. Presented with N/V, upper abdominal pain, weakness.   He was found to have leukocytosis, hyponatremia, hypokalemia, elevated lactic acid.  CT A/P with moderate hiatal hernia with wall thickening of the distal esophagus and gastric cardia, suggesting reflux or esophagitis/gastritis.  Chest x-ray negative.  He underwent EGD 6/22 which revealed LA grade D esophagitis without bleeding, ulcerations extending from 25 to 38 cm from the incisors s/p biopsies, focal nodularity at GE junction s/p biopsied, brushings taken from esophageal mucosa for KOH prep, 6 cm hiatal hernia, normal examined stomach and duodenum.  KOH prep was negative. Biopsies showed hyperplastic changes with reflux at GEJ. Esophageal bx showed ulcer without dysplasia, or intestinal metaplasia. PAS stain negative for fungal elements.   Today: states he is having a hard time eating solid food. Can only eat a few bites before food comes back up. If he tries to eat more than a few bites, he has vomiting. Does better with liquids/soft foods. No problems swallowing medication. If tries to eat more thean gags. No abdominal pain. BM regular. No melena, brbpr. Has had few harder stools lately. Not his normal.    03/2020: 156 pounds 01/2021: 149 pounds 03/2021: 150 pounds  Current Outpatient Medications  Medication  Sig Dispense Refill   Cholecalciferol (VITAMIN D) 2000 units CAPS Take 1 capsule by mouth daily.     clozapine (CLOZARIL) 200 MG tablet Take 200 mg by mouth at bedtime.      fluvoxaMINE (LUVOX) 100 MG tablet Take 100 mg by mouth at bedtime. sleep     gemfibrozil (LOPID) 600 MG tablet Take 600 mg by mouth 2 (two) times daily before a meal.     pantoprazole (PROTONIX) 40 MG tablet Take 1 tablet (40 mg total) by mouth 2 (two) times daily. 60 tablet 1   sucralfate (CARAFATE) 1 g tablet Take 1 tablet (1 g total) by mouth 4 (four) times daily -  with meals and at bedtime. 120 tablet 1   vitamin B-12 (CYANOCOBALAMIN) 100 MCG tablet Take 100 mcg by mouth daily.     No current facility-administered medications for this visit.    Allergies as of 03/20/2021   (No Known Allergies)         ROS:  General: Negative for anorexia,  fever, chills, fatigue, weakness. See hpi ENT: Negative for hoarseness,  nasal congestion.  CV: Negative for chest pain, angina, palpitations, dyspnea on exertion, peripheral edema.  Respiratory: Negative for dyspnea at rest, dyspnea on exertion, cough, sputum, wheezing.  GI: See history of present illness. GU:  Negative for dysuria, hematuria, urinary incontinence, urinary frequency, nocturnal urination.  Endo: Negative for unusual weight change.    Physical Examination:   BP (!) 89/68   Pulse (!) 114   Temp 97.7 F (36.5 C)   Ht 5\' 11"  (1.803 m)   Wt 150 lb 6.4  oz (68.2 kg)   BMI 20.98 kg/m   General: somewhat disheveled male in no acute distress. Accompanied by staff from group home  Eyes: No icterus. Mouth:masked Lungs: Clear to auscultation bilaterally.  Heart: Regular rate and rhythm, no murmurs rubs or gallops.  Abdomen: Bowel sounds are normal, nontender, nondistended, no hepatosplenomegaly or masses, no abdominal bruits or hernia , no rebound or guarding.   Extremities: No lower extremity edema. No clubbing or deformities. Neuro: Alert and oriented  x 4   Skin: Warm and dry, no jaundice.   Psych: Alert and cooperative, normal mood and affect.  Labs:  Lab Results  Component Value Date   CREATININE 0.55 (L) 02/24/2021   BUN 9 02/24/2021   NA 139 02/24/2021   K 3.8 02/24/2021   CL 111 02/24/2021   CO2 23 02/24/2021   Lab Results  Component Value Date   ALT 11 02/21/2021   AST 21 02/21/2021   GGT 13 01/16/2018   ALKPHOS 94 02/21/2021   BILITOT 0.7 02/21/2021   Lab Results  Component Value Date   WBC 6.9 02/24/2021   HGB 9.4 (L) 02/24/2021   HCT 27.7 (L) 02/24/2021   MCV 98.2 02/24/2021   PLT 182 02/24/2021      Imaging Studies: CT Angio Chest Pulmonary Embolism (PE) W or WO Contrast  Result Date: 02/21/2021 CLINICAL DATA:  Three days of postprandial nausea and vomiting and chest pain. EXAM: CT ANGIOGRAPHY CHEST WITH CONTRAST TECHNIQUE: Multidetector CT imaging of the chest was performed using the standard protocol during bolus administration of intravenous contrast. Multiplanar CT image reconstructions and MIPs were obtained to evaluate the vascular anatomy. CONTRAST:  155mL OMNIPAQUE IOHEXOL 350 MG/ML SOLN COMPARISON:  05/25/2015 FINDINGS: Cardiovascular: The heart is normal in size. No pericardial effusion. The aorta is normal in caliber. No dissection or atherosclerotic calcification. No obvious coronary artery calcifications. The pulmonary arterial tree is fairly well opacified. No filling defects to suggest pulmonary embolism. Mediastinum/Nodes: Diffusely and markedly thickened esophageal wall appears fairly similar to the prior CT scan and it appears the patient has a history of intermittent severe esophagitis. Findings suspicious for recurrent esophagitis. No obvious esophageal mass. There is a small hiatal hernia again noted. No mediastinal or hilar mass or lymphadenopathy. Small scattered lymph nodes are stable. The thyroid gland is grossly normal. Lungs/Pleura: No acute pulmonary findings. No infiltrates, edema or  effusions. No worrisome pulmonary lesions or pleural effusions. Upper Abdomen: No significant upper abdominal findings. Musculoskeletal: The bony thorax is intact. Review of the MIP images confirms the above findings. IMPRESSION: 1. No CT findings for pulmonary embolism. 2. Normal thoracic aorta. 3. Diffusely and markedly thickened esophageal wall likely the severe diffuse esophagitis. 4. No acute pulmonary findings. 5. Aortic atherosclerosis. Aortic Atherosclerosis (ICD10-I70.0). Electronically Signed   By: Marijo Sanes M.D.   On: 02/21/2021 12:32   CT ABDOMEN PELVIS W CONTRAST  Result Date: 02/20/2021 CLINICAL DATA:  Nausea and vomiting. EXAM: CT ABDOMEN AND PELVIS WITH CONTRAST TECHNIQUE: Multidetector CT imaging of the abdomen and pelvis was performed using the standard protocol following bolus administration of intravenous contrast. CONTRAST:  42mL OMNIPAQUE IOHEXOL 300 MG/ML  SOLN COMPARISON:  CT 01/28/2017 FINDINGS: Lower chest: Moderate hiatal hernia with wall thickening of the distal esophagus. No pleural effusion or focal airspace disease. Hepatobiliary: Diffusely decreased hepatic density typical of steatosis. No focal hepatic abnormality. Gallbladder physiologically distended, no calcified stone. No biliary dilatation. Pancreas: No ductal dilatation or inflammation. Spleen: Normal in size without focal abnormality.  Adrenals/Urinary Tract: Normal adrenal glands. No hydronephrosis or perinephric edema. Homogeneous renal enhancement with symmetric excretion on delayed phase imaging. Subcentimeter low-density in the posterior lower right kidney is too small to characterize but likely cyst. No renal calculi. Urinary bladder is physiologically distended without wall thickening. Stomach/Bowel: Moderate hiatal hernia with wall thickening of the distal esophagus. There is wall thickening of the gastric cardia. Occasional fluid-filled small bowel without obstruction or inflammation. Normal appendix. Small  volume of colonic stool. Sigmoid colon is redundant. Areas of submucosal fatty infiltration in the colon again seen. There is no colonic inflammation. Vascular/Lymphatic: Mild aortic atherosclerosis. No aortic aneurysm. Patent portal vein. Circumaortic left renal vein. No enlarged lymph nodes in the abdomen or pelvis. Reproductive: Prominent prostate gland spans 5 cm transverse. Other: Fat containing left inguinal hernia.  No ascites or free air. Musculoskeletal: Chronic L1 superior endplate compression fracture. Bone islands in the right proximal femur again seen. IMPRESSION: 1. Moderate hiatal hernia with wall thickening of the distal esophagus and gastric cardia, suggesting reflux or esophagitis/gastritis. 2. Hepatic steatosis. 3. Small fat containing left inguinal hernia. Aortic Atherosclerosis (ICD10-I70.0). Electronically Signed   By: Keith Rake M.D.   On: 02/20/2021 22:14   DG Chest Portable 1 View  Result Date: 02/20/2021 CLINICAL DATA:  Vomiting.  Dyspnea. EXAM: PORTABLE CHEST 1 VIEW COMPARISON:  01/28/2017 FINDINGS: Heart size normal. No pleural effusion or edema. No airspace opacities identified. Visualized osseous structures are intact. IMPRESSION: No acute cardiopulmonary abnormalities. Electronically Signed   By: Kerby Moors M.D.   On: 02/20/2021 20:53     Assessment:  Ulcerative reflux esophagitis: Historically has had ulcerative reflux esophagitis on multiple EGDs.  Last EGD during admission 1 month ago with LA grade D esophagitis without bleeding, ulcerations extending from 25 to 38 cm from the incisors, focal nodularity at the GE junction.  KOH prep negative.  PAS stain negative for fungal elements.  Esophageal biopsy from focal nodularity at [facility] with hyperplastic changes with reflux at the GE J.  Esophageal biopsy showed ulcer without dysplasia or intestinal metaplasia.  Patient currently on pantoprazole 40 mg twice daily, had been on once daily when he presented to the hospital  last month.  He continues to complain of of being unable to tolerate solid foods.  Foods come back up if he tries to force it.  Sometimes can only take a few bites.  Seems to do much better with liquids and is requesting Ensure.  He has a appointment with his primary care in a couple of days, staff states that they will address diet with PCP, potentially getting a prescription for regular Ensure use.  Query possibility of poor gastric emptying contributing to difficult to manage reflux disease.  Anemia: Hemoglobin 14.2 on June 20, when he presented to the hospital.  Hemoglobin dropped to 8.5 during admission, at time of discharge was 9.4.  Likely some hemodilution contributing.  Patient reportedly gets frequent CBCs ordered by his psychiatrist due to potential side effects of medication.  We will get a copy of several for review.  Discussed with patient today, he is overdue for colonoscopy but would like to try to coordinate having colonoscopy done at time of upper endoscopy in the next couple of months to document esophageal healing/rule out underlying Barrett's.  Plan: Gastric emptying study Retrieve copy of CBCs from group home, ordered by psychiatry. Continue pantoprazole 40 mg twice daily. Discussed with PCP regarding Ensure RX. Return to the office in 8 weeks.  We will  move towards a colonoscopy and upper endoscopy at that time.  May consider sooner based on lab findings.

## 2021-03-20 NOTE — Patient Instructions (Addendum)
Gastric emptying study as ordered. Please send copy of the last 3 CBCs done by psychiatry to use for review. [phone] (fax). Continue pantoprazole 40mg  twice daily before a meal.  Please ask PCP to assist in obtaining Ensure or equivalent for nutritional supplement.  We will move towards a colonoscopy and upper endoscopy after your next office visit.  Return office visit in 8 weeks.

## 2021-03-21 ENCOUNTER — Telehealth: Payer: Self-pay | Admitting: *Deleted

## 2021-03-21 NOTE — Telephone Encounter (Signed)
[place] and was advised to call Robet Leu at [phone]. Called and made aware GES scheduled for 7/26 at 10:00am, arrival 9:45am, npo midnight and no stomach meds, test can take up to 4 hrs. He voiced understanding

## 2021-03-28 ENCOUNTER — Other Ambulatory Visit (HOSPITAL_COMMUNITY): Payer: Medicaid Other

## 2021-03-29 ENCOUNTER — Other Ambulatory Visit: Payer: Self-pay

## 2021-03-29 ENCOUNTER — Encounter (HOSPITAL_COMMUNITY)
Admission: RE | Admit: 2021-03-29 | Discharge: 2021-03-29 | Disposition: A | Discharge status: Home / Self Care | Payer: Medicaid Other | Source: Ambulatory Visit | Attending: Gastroenterology | Admitting: Gastroenterology

## 2021-03-29 ENCOUNTER — Encounter (HOSPITAL_COMMUNITY): Payer: Self-pay

## 2021-03-29 DIAGNOSIS — R112 Nausea with vomiting, unspecified: Secondary | ICD-10-CM | POA: Diagnosis not present

## 2021-03-29 MED ORDER — TECHNETIUM TC 99M SULFUR COLLOID
2.0000 | Freq: Once | INTRAVENOUS | Status: AC | PRN
  Administered 2021-03-29: 2.1 via INTRAVENOUS

## 2021-06-27 ENCOUNTER — Encounter: Payer: Self-pay | Admitting: Gastroenterology

## 2021-06-27 ENCOUNTER — Other Ambulatory Visit: Payer: Self-pay

## 2021-06-27 ENCOUNTER — Ambulatory Visit (INDEPENDENT_AMBULATORY_CARE_PROVIDER_SITE_OTHER): Payer: Medicaid Other | Admitting: Gastroenterology

## 2021-06-27 VITALS — BP 109/77 | HR 101 | Temp 97.3°F | Ht 71.0 in | Wt 154.4 lb

## 2021-06-27 DIAGNOSIS — K2210 Ulcer of esophagus without bleeding: Secondary | ICD-10-CM | POA: Diagnosis not present

## 2021-06-27 DIAGNOSIS — D649 Anemia, unspecified: Secondary | ICD-10-CM

## 2021-06-27 DIAGNOSIS — Z86010 Personal history of colonic polyps: Secondary | ICD-10-CM

## 2021-06-27 NOTE — Patient Instructions (Signed)
Continue pantoprazole 40 mg twice daily for acid reflux. Recommend upper endoscopy to document ulcer healing and to rule out a condition called Barrett's esophagus which can be a precursor to esophageal cancer.  Also recommend colonoscopy for history of precancerous colon polyps and anemia.  Patient declined work-up today. Continue to have labs monitored by her psychiatrist and PCP.   If worsening anemia, black or bloody stools, weight loss, swallowing concerns, please let us know. Return to the office in 6 months or sooner if needed.

## 2021-06-27 NOTE — Progress Notes (Signed)
Primary Care Physician: Lauretta Grill, NP  Primary Gastroenterologist:  Garfield Cornea, MD   Chief Complaint  Patient presents with   Follow-up     HPI: Danny Greene is a 60 y.o. male  for follow up.  Last seen in the office July 2022.  History of GERD with documented ulcerative esophagitis on prior EGDs along with Candida esophagitis.  Last colonoscopy August 2018 with 5 polyps removed.  Polyps were tubular adenomas.  Due for colonoscopy in August 2021 however patient has persistently declined.    Hospitalized June 2022 with nausea/vomiting, abdominal pain.  CT abdomen pelvis with moderate hiatal hernia with wall thickening of the distal esophagus and gastric cardia, suggesting reflux esophagitis/gastritis.  Completed EGD June 22 which revealed LA grade D esophagitis without bleeding, ulcerations extending from 25 to 38 cm from the incisors status post biopsies, focal nodularity at the GE junction status post biopsy, brushings taken from esophageal mucosal for KOH prep negative.  6 cm hiatal hernia.  Biopsies from GE junction with hyperplastic changes with reflux.  Esophageal biopsy showed ulcer without dysplasia or intestinal metaplasia.  No fungal elements on stains.  History of anemia during hospitalization back in June.  Presented with hemoglobin of 14.2 but hemoglobin dropped to 8.5, at time of discharge was 9.4.  Likely some hemodilution contributing.  Patient brought copy of labs dated 06/18/2021: Glucose 88, creatinine 0.79, BUN 11, sodium 145, potassium 3.6, albumin 4.3, total bilirubin 0.3, alkaline phosphatase 143, AST 16, ALT 10, white blood cell count 6500, hemoglobin 12.1, hematocrit 37.2, MCV 100, B12 425, folate 11.4, hepatitis B core antibody total nonreactive.  Completed gastric emptying study July 2022 which was normal.  Today: Patient states he is eating much better.  Denies dysphagia.  Denies heartburn or vomiting.  Weight is up 4 pounds.  No bowel concerns.   No blood in the stool or melena.   Current Outpatient Medications  Medication Sig Dispense Refill   Cholecalciferol (VITAMIN D) 2000 units CAPS Take 1 capsule by mouth daily.     clozapine (CLOZARIL) 200 MG tablet Take 200 mg by mouth at bedtime.      fluvoxaMINE (LUVOX) 100 MG tablet Take 100 mg by mouth at bedtime. sleep     gemfibrozil (LOPID) 600 MG tablet Take 600 mg by mouth 2 (two) times daily before a meal.     pantoprazole (PROTONIX) 40 MG tablet Take 1 tablet (40 mg total) by mouth 2 (two) times daily. 60 tablet 1   sucralfate (CARAFATE) 1 g tablet Take 1 tablet (1 g total) by mouth 4 (four) times daily -  with meals and at bedtime. 120 tablet 1   vitamin B-12 (CYANOCOBALAMIN) 100 MCG tablet Take 100 mcg by mouth daily.     No current facility-administered medications for this visit.    Allergies as of 06/27/2021   (No Known Allergies)    ROS:  General: Negative for anorexia, weight loss, fever, chills, fatigue, weakness. ENT: Negative for hoarseness, difficulty swallowing , nasal congestion. CV: Negative for chest pain, angina, palpitations, dyspnea on exertion, peripheral edema.  Respiratory: Negative for dyspnea at rest, dyspnea on exertion, cough, sputum, wheezing.  GI: See history of present illness. GU:  Negative for dysuria, hematuria, urinary incontinence, urinary frequency, nocturnal urination.  Endo: Negative for unusual weight change.    Physical Examination:   BP 109/77   Pulse (!) 101   Temp (!) 97.3 F (36.3 C)   Ht 5\' 11"  (1.803  m)   Wt 154 lb 6.4 oz (70 kg)   BMI 21.53 kg/m   General: Somewhat disheveled appearing male in no acute distress. Eyes: No icterus. Mouth: masked Abdomen: Bowel sounds are normal, nontender, nondistended, no hepatosplenomegaly or masses, no abdominal bruits or hernia , no rebound or guarding.   Extremities: No lower extremity edema. No clubbing or deformities. Neuro: Alert and oriented x 4  Skin: Warm and dry, no  jaundice.   Psych: Alert and cooperative, normal mood. flat affect.  Labs:  See hpi   Imaging Studies: No results found.   Assessment:  Ulcerative reflux esophagitis: Has had ulcerative reflux esophagitis on multiple EGDs.  Last EGD in June 2022 as outlined above.  I recommended follow-up EGD to verify healing of esophagitis, rule out underlying Barrett's esophagus.  Patient continues to decline.  He states he is tolerating solid foods now.  He denies any GI symptoms.  Staff present with him today, nausea has been less of an issue.  He is also consuming 2 ensures daily.  History of adenomatous colon polyps: Was due for surveillance exam August 2021 but patient continues to decline.  Anemia: Hemoglobin was 9.4 at time of discharge back in June.  Current hemoglobin 12.1.  Folate and vitamin B12 normal.  MCV 100.0.  Patient declines EGD and colonoscopy.  Plan: Continue pantoprazole 40 mg twice daily. Continue to have labs followed by psychiatry and PCP. Notify GI if change in appetite, vomiting, dysphagia, weight loss, black or bloody stools, worsening anemia. Return to the office in 6 months or sooner if needed.

## 2021-06-28 ENCOUNTER — Ambulatory Visit: Payer: Medicaid Other | Admitting: Gastroenterology

## 2021-08-15 IMAGING — CT CT ABD-PELV W/ CM
2 of 4 series · 16 of 46 positions shown, 18 images · IV contrast (Omnipaque or Isovue)
Comparison: CT 01/28/2017

CLINICAL DATA: Nausea and vomiting.

EXAM:
CT ABDOMEN AND PELVIS WITH CONTRAST
TECHNIQUE: Multidetector CT imaging of the abdomen and pelvis was performed
using the standard protocol following bolus administration of
intravenous contrast.
CONTRAST:  75mL OMNIPAQUE IOHEXOL 300 MG/ML  SOLN

[Series 2: axial st · axial · 0.74mm/px · z∈[+681,+1081]mm · 13 of 90 slices shown, 15 images]
[im 5/90  soft-tissue]
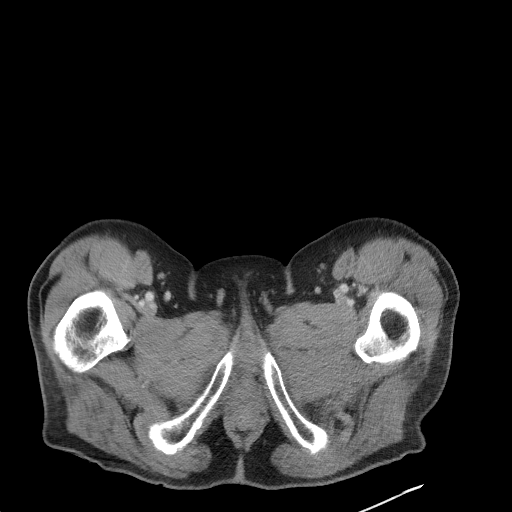
[im 5/90  bone]
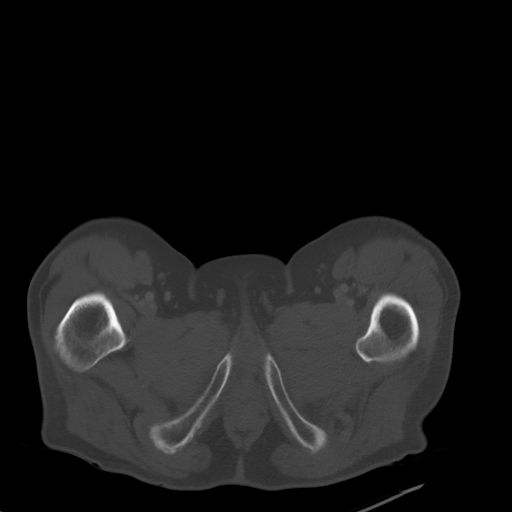
[im 13/90  soft-tissue]
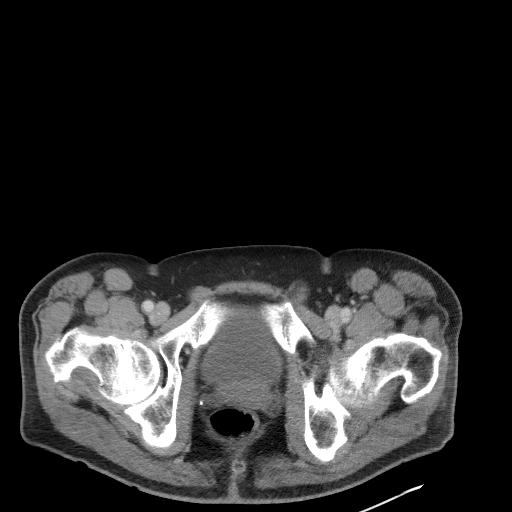
[im 17/90  soft-tissue]
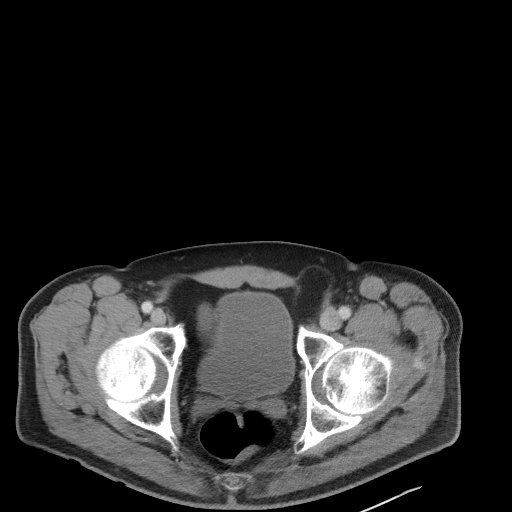
[im 26/90  soft-tissue]
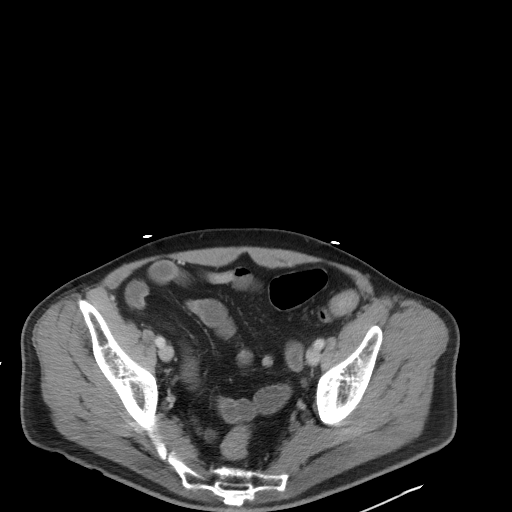
[im 30/90  soft-tissue]
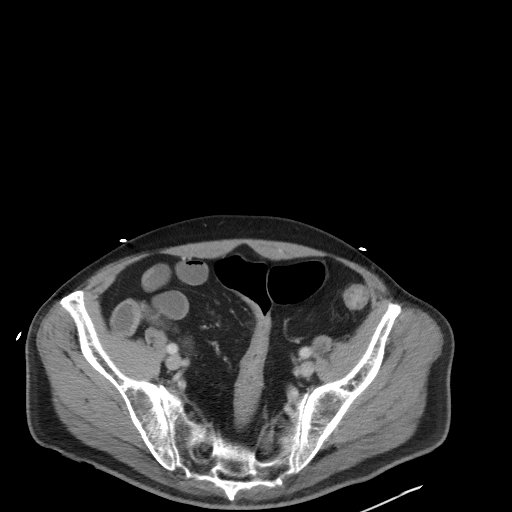
[im 39/90  soft-tissue]
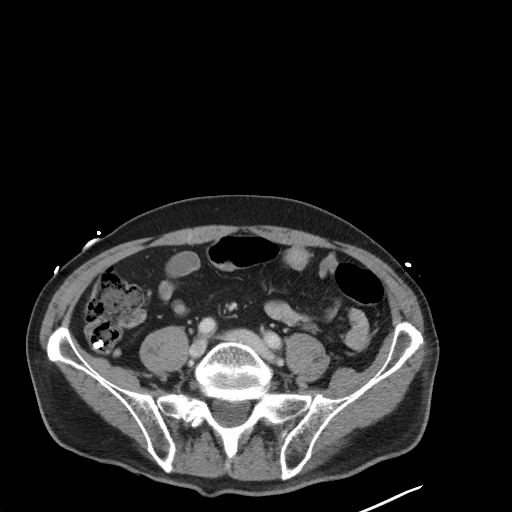
[im 47/90  soft-tissue]
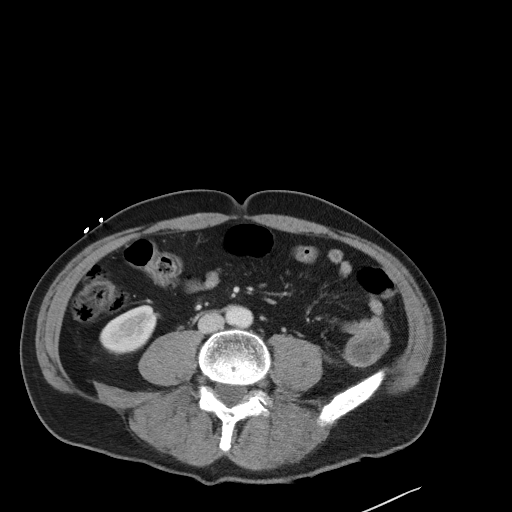
[im 51/90  soft-tissue]
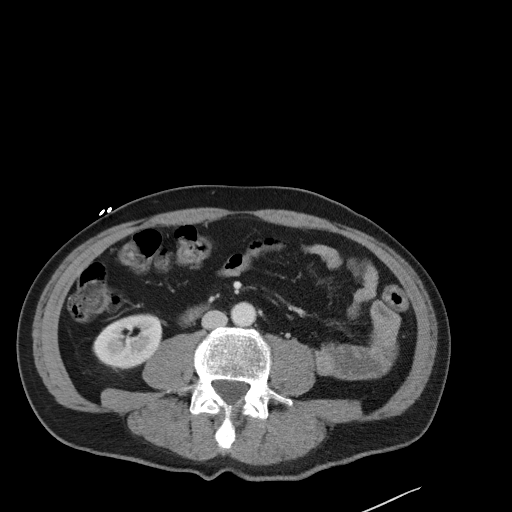
[im 60/90  soft-tissue]
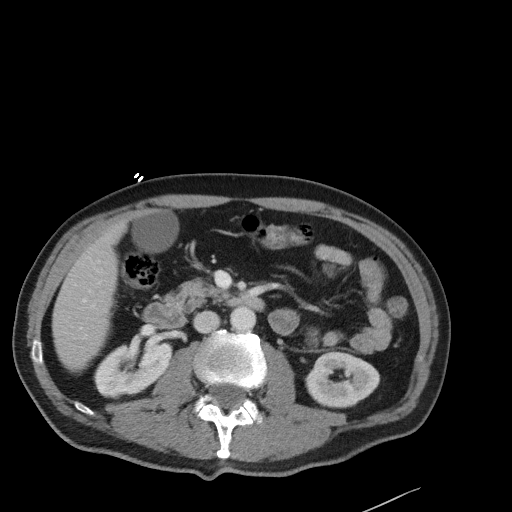
[im 60/90  bone]
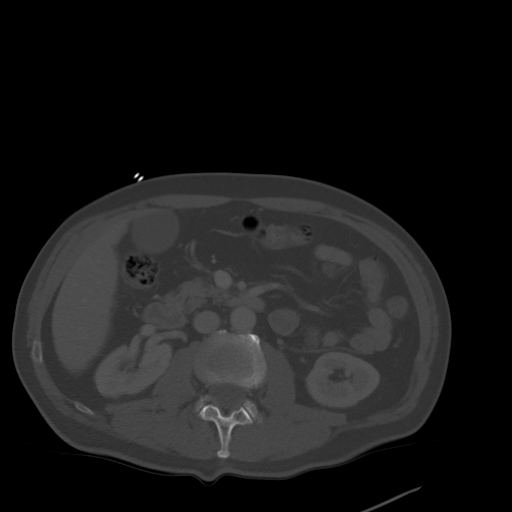
[im 64/90  soft-tissue]
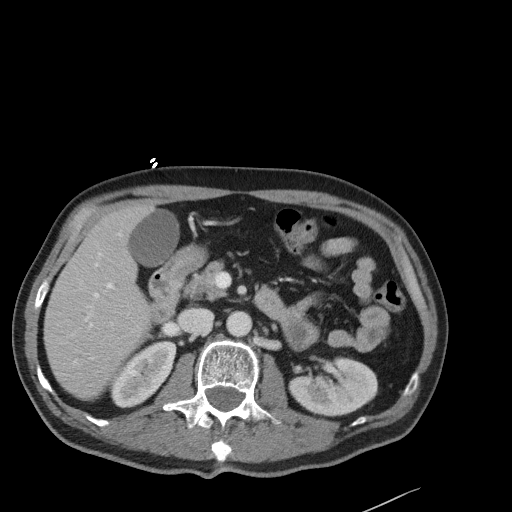
[im 73/90  soft-tissue]
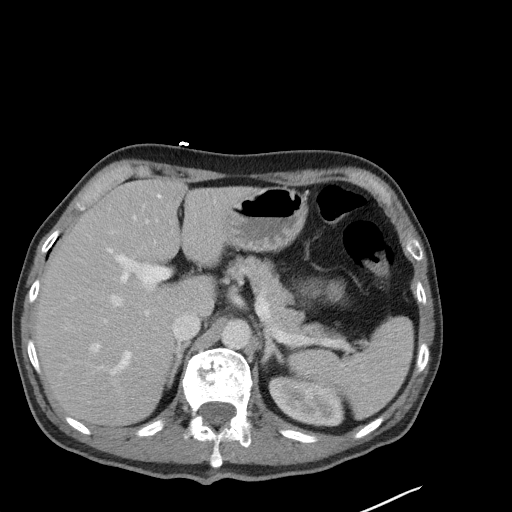
[im 77/90  soft-tissue]
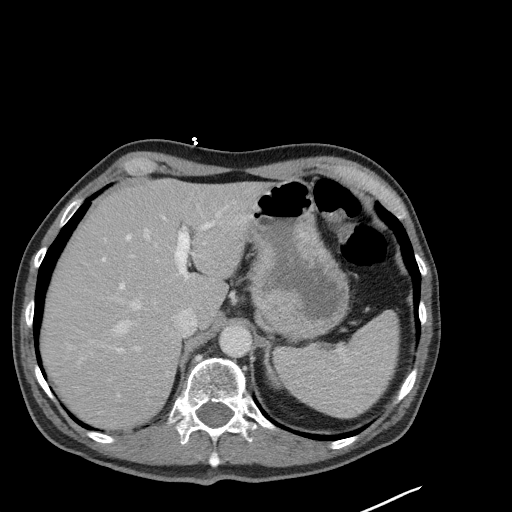
[im 85/90  soft-tissue]
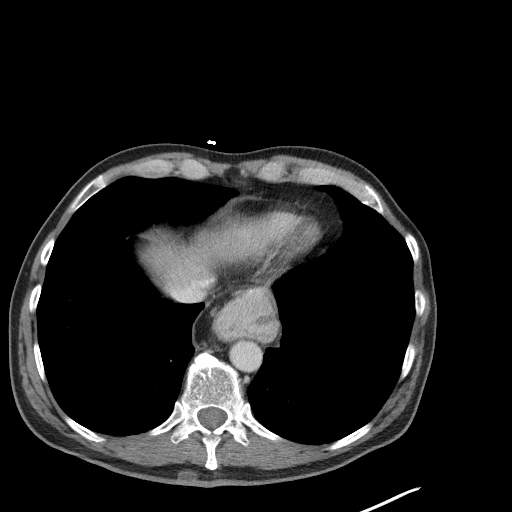

[Series 5: coronal st · coronal · 0.71mm/px · 3 of 80 slices shown]
[im 27/80  soft-tissue]
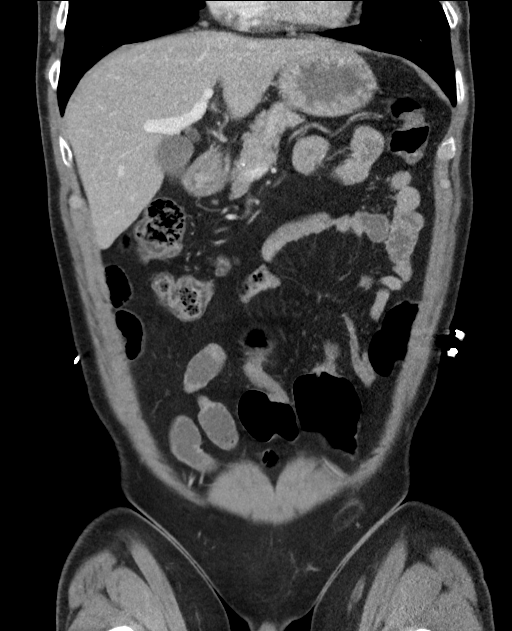
[im 36/80  soft-tissue]
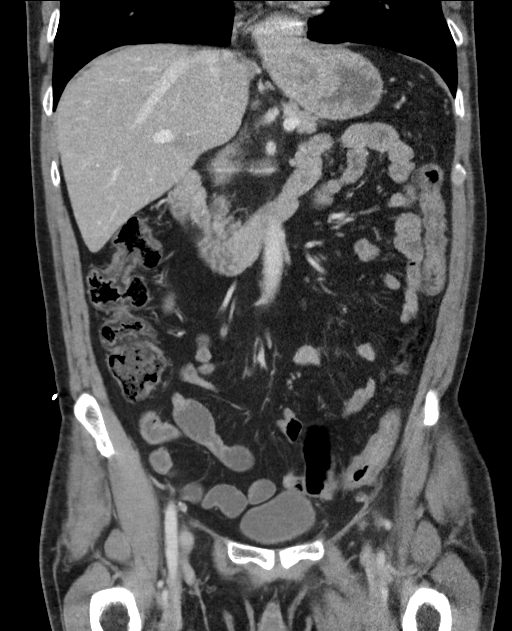
[im 44/80  soft-tissue]
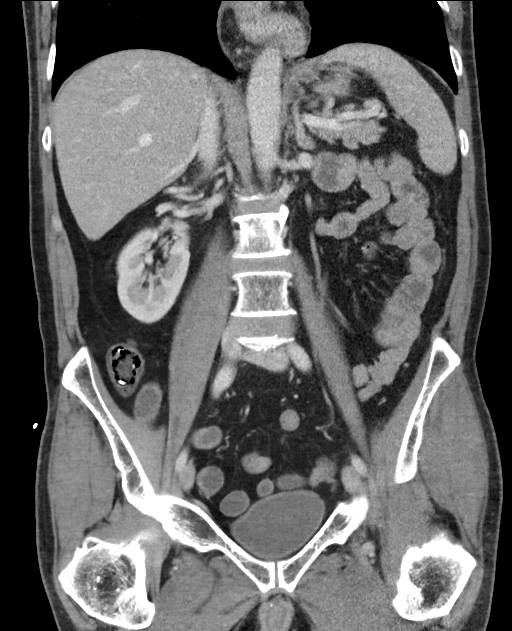

[16 of 46 positions shown; findings below may reference images not displayed]

FINDINGS: Lower chest: Moderate hiatal hernia with wall thickening of the
distal esophagus. No pleural effusion or focal airspace disease.

Hepatobiliary: Diffusely decreased hepatic density typical of
steatosis. No focal hepatic abnormality. Gallbladder physiologically
distended, no calcified stone. No biliary dilatation.

Pancreas: No ductal dilatation or inflammation.

Spleen: Normal in size without focal abnormality.

Adrenals/Urinary Tract: Normal adrenal glands. No hydronephrosis or
perinephric edema. Homogeneous renal enhancement with symmetric
excretion on delayed phase imaging. Subcentimeter low-density in the
posterior lower right kidney is too small to characterize but likely
cyst. No renal calculi. Urinary bladder is physiologically distended
without wall thickening.

Stomach/Bowel: Moderate hiatal hernia with wall thickening of the
distal esophagus. There is wall thickening of the gastric cardia.
Occasional fluid-filled small bowel without obstruction or
inflammation. Normal appendix. Small volume of colonic stool.
Sigmoid colon is redundant. Areas of submucosal fatty infiltration
in the colon again seen. There is no colonic inflammation.

Vascular/Lymphatic: Mild aortic atherosclerosis. No aortic aneurysm.
Patent portal vein. Circumaortic left renal vein. No enlarged lymph
nodes in the abdomen or pelvis.

Reproductive: Prominent prostate gland spans 5 cm transverse.

Other: Fat containing left inguinal hernia.  No ascites or free air.

Musculoskeletal: Chronic L1 superior endplate compression fracture.
Bone islands in the right proximal femur again seen.
IMPRESSION: 1. Moderate hiatal hernia with wall thickening of the distal
esophagus and gastric cardia, suggesting reflux or
esophagitis/gastritis.
2. Hepatic steatosis.
3. Small fat containing left inguinal hernia.

Aortic Atherosclerosis (QVS59-IPE.E).

## 2021-12-26 ENCOUNTER — Ambulatory Visit: Payer: Medicaid Other | Admitting: Gastroenterology

## 2022-03-07 ENCOUNTER — Ambulatory Visit: Payer: Medicaid Other | Admitting: Gastroenterology

## 2022-05-02 ENCOUNTER — Ambulatory Visit (INDEPENDENT_AMBULATORY_CARE_PROVIDER_SITE_OTHER): Payer: Medicaid Other | Admitting: Gastroenterology

## 2022-05-02 ENCOUNTER — Encounter: Payer: Self-pay | Admitting: Gastroenterology

## 2022-05-02 VITALS — BP 112/79 | HR 94 | Temp 98.0°F | Ht 71.0 in | Wt 150.6 lb

## 2022-05-02 DIAGNOSIS — K2100 Gastro-esophageal reflux disease with esophagitis, without bleeding: Secondary | ICD-10-CM | POA: Diagnosis not present

## 2022-05-02 DIAGNOSIS — K449 Diaphragmatic hernia without obstruction or gangrene: Secondary | ICD-10-CM | POA: Diagnosis not present

## 2022-05-02 NOTE — Progress Notes (Signed)
GI Office Note    Referring Provider: Lauretta Grill, NP Primary Care Physician:  Lauretta Grill, NP  Primary Gastroenterologist: Garfield Cornea, MD   Chief Complaint   Chief Complaint  Patient presents with   Gastroesophageal Reflux    History of Present Illness   Danny Greene is a 61 y.o. male presenting today for follow-up.  Last seen in October 2022.  He has a history of ulcerative reflux esophagitis on multiple EGDs,moderate sized hiatal hernia,candida esophagitis, adenomatous colon polyps.  Patient was due for surveillance colonoscopy August 2021 but persistently declined.    EGD June 2022 with LA grade D esophagitis without bleeding, ulcerations extending from 25 to 38 cm from the incisors status post biopsies, focal nodularity at the GE junction status post biopsy, brushings taken from esophageal mucosa for KOH prep negative.  6 cm hiatal hernia.  Biopsies from GEJ with hyperplastic changes with reflux.  Esophageal biopsy showed ulcer without dysplasia or intestinal metaplasia.  No fungal elements on stains.  Gastric emptying study July 2022.  At time of last office visit we continue to encourage patient to consider follow-up EGD to verify healing of esophagitis and rule out underlying Barrett's but he declined.  Labs from January 2023: Hemoglobin 12.1, hematocrit 35.3, MCV 95.     Today: no complaints. Heartburn doing ok. No dysphagia. Appetite good. Denies abdominal pain. BMs regular. Denies rectal bleeding or black stools.   Medications   Current Outpatient Medications  Medication Sig Dispense Refill   calcium carbonate (CALCIUM 600) 600 MG TABS tablet Take 600 mg by mouth 2 (two) times daily with a meal.     Cholecalciferol (VITAMIN D) 2000 units CAPS Take 1 capsule by mouth daily.     clozapine (CLOZARIL) 200 MG tablet Take 200 mg by mouth at bedtime.      fluvoxaMINE (LUVOX) 100 MG tablet Take 100 mg by mouth at bedtime. sleep     gemfibrozil (LOPID) 600  MG tablet Take 600 mg by mouth 2 (two) times daily before a meal.     Multiple Vitamins-Minerals (THERA-M) TABS Take 1 tablet by mouth daily.     pantoprazole (PROTONIX) 40 MG tablet Take 1 tablet (40 mg total) by mouth 2 (two) times daily. 60 tablet 1   propranolol (INDERAL) 10 MG tablet Take 10 mg by mouth daily.     sucralfate (CARAFATE) 1 g tablet Take 1 tablet (1 g total) by mouth 4 (four) times daily -  with meals and at bedtime. 120 tablet 1   vitamin B-12 (CYANOCOBALAMIN) 100 MCG tablet Take 100 mcg by mouth daily.     No current facility-administered medications for this visit.    Allergies   Allergies as of 05/02/2022   (No Known Allergies)       Review of Systems   General: Negative for anorexia, weight loss, fever, chills, fatigue, weakness. ENT: Negative for hoarseness, difficulty swallowing , nasal congestion. CV: Negative for chest pain, angina, palpitations, dyspnea on exertion, peripheral edema.  Respiratory: Negative for dyspnea at rest, dyspnea on exertion, cough, sputum, wheezing.  GI: See history of present illness. GU:  Negative for dysuria, hematuria, urinary incontinence, urinary frequency, nocturnal urination.  Endo: Negative for unusual weight change.     Physical Exam   BP 112/79 (BP Location: Left Arm, Patient Position: Sitting, Cuff Size: Normal)   Pulse 94   Temp 98 F (36.7 C) (Oral)   Ht '5\' 11"'$  (1.803 m)   Wt 150 lb  9.6 oz (68.3 kg)   SpO2 98%   BMI 21.00 kg/m    General: disheveled in appearance. At baseline. in no acute distress.  Eyes: No icterus. Mouth: Oropharyngeal mucosa moist and pink, no lesions erythema or exudate. Lungs: Clear to auscultation bilaterally.  Heart: Regular rate and rhythm, no murmurs rubs or gallops.  Abdomen: Bowel sounds are normal, nontender, nondistended, no hepatosplenomegaly or masses,  no abdominal bruits or hernia , no rebound or guarding.  Rectal: not performed Extremities: No lower extremity edema. No  clubbing or deformities. Neuro: Alert and oriented x 4   Skin: Warm and dry, no jaundice.   Psych: Alert and cooperative, normal mood and affect.  Labs   See hpi  Imaging Studies   No results found.  Assessment   Ulcerative reflux esophagitis: Last EGD June 2022 as outlined above.  Patient declines follow-up EGD to verify esophageal healing and rule out Barrett's esophagus.  Patient and staff state that he is doing well.Briefly discussed antireflux surgery options with patient not interested.  History of adenomatous colon polyps: Overdue for surveillance exam, patient continues to decline.     PLAN   Patient declines colonoscopy and upper endoscopy as outlined. Continue pantoprazole 40 mg twice daily. Return to the office in 1 year or sooner if needed.   Laureen Ochs. Bobby Rumpf, [place], [facility]

## 2022-05-02 NOTE — Patient Instructions (Signed)
Continue pantoprazole '40mg'$  twice daily before breakfast and evening meal. You have declined and upper endoscopy and colonoscopy today, we will continue monitoring for now. Return to the office in one year or sooner if needed.

## 2022-11-01 ENCOUNTER — Ambulatory Visit: Payer: Medicaid Other | Attending: Internal Medicine | Admitting: Internal Medicine

## 2022-11-01 ENCOUNTER — Encounter: Payer: Self-pay | Admitting: Internal Medicine

## 2022-11-01 VITALS — BP 110/64 | HR 110 | Ht 71.0 in | Wt 155.0 lb

## 2022-11-01 DIAGNOSIS — R002 Palpitations: Secondary | ICD-10-CM | POA: Diagnosis not present

## 2022-11-01 MED ORDER — METOPROLOL TARTRATE 25 MG PO TABS: 25.0000 mg | ORAL_TABLET | Freq: Every day | ORAL | 3 refills | Status: DC | PRN

## 2022-11-01 NOTE — Progress Notes (Signed)
Cardiology Office Note  Date: 11/01/2022   ID: Danny Greene 1960/10/29, MRN [id number]  PCP:  Lauretta Grill, NP  Cardiologist:  None Electrophysiologist:  None   Reason for Office Visit: Evaluation of hypotension tachycardia at the request of Hatchett, NP   History of Present Illness: Danny Greene is a 62 y.o. male known to have schizophrenia was referred to cardiology clinic for evaluation of hypotension and tachycardia.  Patient lives in family care, used to work until 2 weeks ago when he was replaced. He used to lift wooden blocks and stacked them. Did not have any symptoms of SOB or chest pain with that. After he was removed from his job, he was watching TV and sometimes walks. Has no symptoms of chest pain, SOB, palpitations, dizziness, syncope, leg swelling.  Past Medical History:  Diagnosis Date   Constipation    GERD (gastroesophageal reflux disease)    Hepatitis C antibody test positive    HCV RNA negative 11/2015 and negative 01/2018   History of kidney stones    Hypertension    Schizophrenia [facility])     Past Surgical History:  Procedure Laterality Date   BIOPSY  04/22/2017   Procedure: BIOPSY;  Surgeon: Daneil Dolin, MD;  [facility]: [facility];  Service: Endoscopy;;  esophagus   BIOPSY  02/22/2021   Procedure: BIOPSY;  Surgeon: Rogene Houston, MD;  Location: [facility];  Service: Endoscopy;;   CHOLECYSTECTOMY     COLONOSCOPY WITH PROPOFOL N/A 04/22/2017   Dr. Gala Romney: 5 semi-pedunculated polyps found in the sigmoid, splenic, ascending colon measuring 4 to 6 mm in size.  Tubular adenomas.  Next colonoscopy in 3 years.   ESOPHAGEAL BRUSHING  02/22/2021   Procedure: ESOPHAGEAL BRUSHING;  Surgeon: Rogene Houston, MD;  Location: [facility];  Service: Endoscopy;;   ESOPHAGEAL DILATION N/A 05/26/2015   Procedure: ESOPHAGEAL DILATION;  Surgeon: Daneil Dolin, MD;  [facility]: [facility];  Service: Endoscopy;  Laterality: N/A;   ESOPHAGEAL DILATION   09/21/2015   Procedure: ESOPHAGEAL DILATION;  Surgeon: Daneil Dolin, MD;  [facility]: [facility];  Service: Endoscopy;;   ESOPHAGOGASTRODUODENOSCOPY N/A 05/26/2015   RMR: Severe ulcerative esophagitits ad described. Status post biopsy. Hiatal hernia.    ESOPHAGOGASTRODUODENOSCOPY N/A 09/21/2015   RMR: Abnormal distal esophagus query short segment Barretts status post biopsy. Severe esophagitis seen previously has resolved. Hiatal hernia. Status post maloney dilation prior to biopsy. No barrett's   ESOPHAGOGASTRODUODENOSCOPY (EGD) WITH PROPOFOL N/A 02/01/2017   Dr. Gala Romney: LA grade D esophagitis with overlying ribbonlike plaques, KOH few yeast. Marked friability of esophageal mucosa. Recommend EGD in 3 months, twice a day PPI.   ESOPHAGOGASTRODUODENOSCOPY (EGD) WITH PROPOFOL N/A 04/22/2017   Dr. Gala Romney: Medium sized hiatal hernia, distal exudative erosive/ulcerative reflux esophagitis involving the distal 5 cm of the tubular office.  Biopsies negative for Barrett's esophagus.   ESOPHAGOGASTRODUODENOSCOPY (EGD) WITH PROPOFOL N/A 02/22/2021   Procedure: ESOPHAGOGASTRODUODENOSCOPY (EGD) WITH PROPOFOL;  Surgeon: Rogene Houston, MD;  [facility];  Service: Endoscopy;  Laterality: N/A;   KIDNEY STONE SURGERY      Current Outpatient Medications  Medication Sig Dispense Refill   calcium carbonate (CALCIUM 600) 600 MG TABS tablet Take 600 mg by mouth 2 (two) times daily with a meal.     Cholecalciferol (VITAMIN D) 2000 units CAPS Take 1 capsule by mouth daily.     clozapine (CLOZARIL) 200 MG tablet Take 200 mg by mouth at bedtime.  fluvoxaMINE (LUVOX) 100 MG tablet Take 100 mg by mouth at bedtime. sleep     gemfibrozil (LOPID) 600 MG tablet Take 600 mg by mouth 2 (two) times daily before a meal.     Multiple Vitamins-Minerals (THERA-M) TABS Take 1 tablet by mouth daily.     pantoprazole (PROTONIX) 40 MG tablet Take 1 tablet (40 mg total) by mouth 2 (two) times daily. 60 tablet 1    propranolol (INDERAL) 10 MG tablet Take 10 mg by mouth daily.     sucralfate (CARAFATE) 1 g tablet Take 1 tablet (1 g total) by mouth 4 (four) times daily -  with meals and at bedtime. 120 tablet 1   vitamin B-12 (CYANOCOBALAMIN) 100 MCG tablet Take 100 mcg by mouth daily.     No current facility-administered medications for this visit.   Allergies:  Patient has no known allergies.   Social History: The patient  reports that he has quit smoking. His smoking use included cigarettes. He has a 2.00 pack-year smoking history. He has never used smokeless tobacco. He reports that he does not drink alcohol and does not use drugs.   Family History: The patient's family history is not on file.   ROS:  Please see the history of present illness. Otherwise, complete review of systems is positive for none.  All other systems are reviewed and negative.   Physical Exam: VS:  BP 110/64   Pulse (!) 110   Ht '5\' 11"'$  (1.803 m)   Wt 155 lb (70.3 kg)   SpO2 99%   BMI 21.62 kg/m , BMI Body mass index is 21.62 kg/m.  Wt Readings from Last 3 Encounters:  11/01/22 155 lb (70.3 kg)  05/02/22 150 lb 9.6 oz (68.3 kg)  06/27/21 154 lb 6.4 oz (70 kg)    General: Patient appears comfortable at rest. HEENT: Conjunctiva and lids normal, oropharynx clear with moist mucosa. Neck: Supple, no elevated JVP or carotid bruits, no thyromegaly. Lungs: Clear to auscultation, nonlabored breathing at rest. Cardiac: Regular rate and rhythm, no S3 or significant systolic murmur, no pericardial rub. Abdomen: Soft, nontender, no hepatomegaly, bowel sounds present, no guarding or rebound. Extremities: No pitting edema, distal pulses 2+. Skin: Warm and dry. Musculoskeletal: No kyphosis. Neuropsychiatric: Alert and oriented x3, affect grossly appropriate.  ECG: Sinus tachycardia, HR 110 bpm  Recent Labwork: No results found for requested labs within last 365 days.     Component Value Date/Time   CHOL 166 08/10/2014 1730    TRIG 278 (H) 08/10/2014 1730   HDL 35 (L) 08/10/2014 1730   VLDL 56 (H) 08/10/2014 1730   [place] 75 08/10/2014 1730    Other Studies Reviewed Today:   Assessment and Plan: Patient is a 62 year old M known to have schizophrenia, HLD was referred to cardiology clinic for evaluation of hypotension and tachycardia.  # Sinus tachycardia -Patient denies symptoms of palpitations nor he is in pain. Does not have anxiety. I would recommend metoprolol tartrate 25 mg daily as needed only if patient experiences palpitations and not to treat asymptomatic sinus tachycardia. Stop propranolol.  # HLD -Continue gemfibrozil, management per PCP.  I have spent a total of 30 minutes with patient reviewing chart, EKGs, labs and examining patient as well as establishing an assessment and plan that was discussed with the patient.  > 50% of time was spent in direct patient care.     Medication Adjustments/Labs and Tests Ordered: Current medicines are reviewed at length with the patient today.  Concerns regarding medicines are outlined above.   Tests Ordered: No orders of the defined types were placed in this encounter.   Medication Changes: No orders of the defined types were placed in this encounter.   Disposition:  Follow up prn  Signed, Reubin Bushnell Fidel Levy, MD, 11/01/2022 9:38 AM    [facility] at [facility] [address] [place], [place] [address]

## 2022-11-01 NOTE — Patient Instructions (Signed)
Medication Instructions:  Your physician has recommended you make the following change in your medication:  - Start Metoprolol Tartrate 25 mg tablets once daily- AS NEEDED FOR PALPITATIONS  *If you need a refill on your cardiac medications before your next appointment, please call your pharmacy*   Lab Work: None If you have labs (blood work) drawn today and your tests are completely normal, you will receive your results only by: [place] (if you have MyChart) OR A paper copy in the mail If you have any lab test that is abnormal or we need to change your treatment, we will call you to review the results.   Testing/Procedures: None   Follow-Up: At [facility], you and your health needs are our priority.  As part of our continuing mission to provide you with exceptional heart care, we have created designated Provider Care Teams.  These Care Teams include your primary Cardiologist (physician) and Advanced Practice Providers (APPs -  Physician Assistants and Nurse Practitioners) who all work together to provide you with the care you need, when you need it.  We recommend signing up for the patient portal called "MyChart".  Sign up information is provided on this After Visit Summary.  MyChart is used to connect with patients for Virtual Visits (Telemedicine).  Patients are able to view lab/test results, encounter notes, upcoming appointments, etc.  Non-urgent messages can be sent to your provider as well.   To learn more about what you can do with MyChart, go to [email or web address].    Your next appointment:   Follow up with Dr. Dellia Cloud as needed.

## 2023-03-13 ENCOUNTER — Encounter: Payer: Self-pay | Admitting: Gastroenterology

## 2024-04-06 ENCOUNTER — Inpatient Hospital Stay (HOSPITAL_COMMUNITY)
Admission: EM | Admit: 2024-04-06 | Discharge: 2024-04-08 | DRG: 684 | Disposition: A | Discharge status: Skilled Nursing Facility | Payer: MEDICAID | Source: Skilled Nursing Facility | Attending: Internal Medicine | Admitting: Internal Medicine

## 2024-04-06 ENCOUNTER — Encounter (HOSPITAL_COMMUNITY): Payer: Self-pay | Admitting: Emergency Medicine

## 2024-04-06 ENCOUNTER — Emergency Department (HOSPITAL_COMMUNITY): Payer: MEDICAID

## 2024-04-06 ENCOUNTER — Other Ambulatory Visit: Payer: Self-pay

## 2024-04-06 DIAGNOSIS — N179 Acute kidney failure, unspecified: Secondary | ICD-10-CM | POA: Diagnosis present

## 2024-04-06 DIAGNOSIS — Z87442 Personal history of urinary calculi: Secondary | ICD-10-CM | POA: Diagnosis not present

## 2024-04-06 DIAGNOSIS — Z79899 Other long term (current) drug therapy: Secondary | ICD-10-CM

## 2024-04-06 DIAGNOSIS — N400 Enlarged prostate without lower urinary tract symptoms: Secondary | ICD-10-CM | POA: Diagnosis present

## 2024-04-06 DIAGNOSIS — Z6820 Body mass index (BMI) 20.0-20.9, adult: Secondary | ICD-10-CM

## 2024-04-06 DIAGNOSIS — K4090 Unilateral inguinal hernia, without obstruction or gangrene, not specified as recurrent: Secondary | ICD-10-CM | POA: Diagnosis present

## 2024-04-06 DIAGNOSIS — Z860100 Personal history of colon polyps, unspecified: Secondary | ICD-10-CM

## 2024-04-06 DIAGNOSIS — F209 Schizophrenia, unspecified: Secondary | ICD-10-CM | POA: Diagnosis present

## 2024-04-06 DIAGNOSIS — R627 Adult failure to thrive: Secondary | ICD-10-CM | POA: Diagnosis present

## 2024-04-06 DIAGNOSIS — E860 Dehydration: Secondary | ICD-10-CM | POA: Diagnosis present

## 2024-04-06 DIAGNOSIS — R630 Anorexia: Secondary | ICD-10-CM | POA: Diagnosis present

## 2024-04-06 DIAGNOSIS — K2100 Gastro-esophageal reflux disease with esophagitis, without bleeding: Secondary | ICD-10-CM | POA: Diagnosis present

## 2024-04-06 DIAGNOSIS — I10 Essential (primary) hypertension: Secondary | ICD-10-CM | POA: Diagnosis present

## 2024-04-06 DIAGNOSIS — Z9049 Acquired absence of other specified parts of digestive tract: Secondary | ICD-10-CM

## 2024-04-06 DIAGNOSIS — E781 Pure hyperglyceridemia: Secondary | ICD-10-CM | POA: Diagnosis present

## 2024-04-06 DIAGNOSIS — R002 Palpitations: Secondary | ICD-10-CM | POA: Diagnosis present

## 2024-04-06 DIAGNOSIS — K219 Gastro-esophageal reflux disease without esophagitis: Secondary | ICD-10-CM | POA: Diagnosis present

## 2024-04-06 DIAGNOSIS — Z87891 Personal history of nicotine dependence: Secondary | ICD-10-CM

## 2024-04-06 LAB — COMPREHENSIVE METABOLIC PANEL WITH GFR
ALT: 17 U/L (ref 0–44)
AST: 22 U/L (ref 15–41)
Albumin: 3.9 g/dL (ref 3.5–5.0)
Alkaline Phosphatase: 110 U/L (ref 38–126)
Anion gap: 17 — ABNORMAL HIGH (ref 5–15)
BUN: 53 mg/dL — ABNORMAL HIGH (ref 8–23)
CO2: 20 mmol/L — ABNORMAL LOW (ref 22–32)
Calcium: 10 mg/dL (ref 8.9–10.3)
Chloride: 99 mmol/L (ref 98–111)
Creatinine, Ser: 3.24 mg/dL — ABNORMAL HIGH (ref 0.61–1.24)
GFR, Estimated: 21 mL/min — ABNORMAL LOW (ref >60)
Glucose, Bld: 153 mg/dL — ABNORMAL HIGH (ref 70–99)
Potassium: 3.6 mmol/L (ref 3.5–5.1)
Sodium: 136 mmol/L (ref 135–145)
Total Bilirubin: 0.6 mg/dL (ref 0.0–1.2)
Total Protein: 7.9 g/dL (ref 6.5–8.1)

## 2024-04-06 LAB — CBC WITH DIFFERENTIAL/PLATELET
Abs Immature Granulocytes: 0.04 K/uL (ref 0.00–0.07)
Basophils Absolute: 0 K/uL (ref 0.0–0.1)
Basophils Relative: 0 %
Eosinophils Absolute: 0 K/uL (ref 0.0–0.5)
Eosinophils Relative: 0 %
HCT: 38.9 % — ABNORMAL LOW (ref 39.0–52.0)
Hemoglobin: 13.5 g/dL (ref 13.0–17.0)
Immature Granulocytes: 0 %
Lymphocytes Relative: 15 %
Lymphs Abs: 1.4 K/uL (ref 0.7–4.0)
MCH: 33.8 pg (ref 26.0–34.0)
MCHC: 34.7 g/dL (ref 30.0–36.0)
MCV: 97.3 fL (ref 80.0–100.0)
Monocytes Absolute: 1.2 K/uL — ABNORMAL HIGH (ref 0.1–1.0)
Monocytes Relative: 13 %
Neutro Abs: 6.7 K/uL (ref 1.7–7.7)
Neutrophils Relative %: 72 %
Platelets: 256 K/uL (ref 150–400)
RBC: 4 MIL/uL — ABNORMAL LOW (ref 4.22–5.81)
RDW: 12.4 % (ref 11.5–15.5)
WBC: 9.4 K/uL (ref 4.0–10.5)
nRBC: 0 % (ref 0.0–0.2)

## 2024-04-06 LAB — URINALYSIS, COMPLETE (UACMP) WITH MICROSCOPIC
Bacteria, UA: NONE SEEN
Bilirubin Urine: NEGATIVE
Glucose, UA: NEGATIVE mg/dL
Hgb urine dipstick: NEGATIVE
Ketones, ur: NEGATIVE mg/dL
Leukocytes,Ua: NEGATIVE
Nitrite: NEGATIVE
Protein, ur: 30 mg/dL — AB
Specific Gravity, Urine: 1.018 (ref 1.005–1.030)
pH: 5 (ref 5.0–8.0)

## 2024-04-06 LAB — MAGNESIUM: Magnesium: 1.9 mg/dL (ref 1.7–2.4)

## 2024-04-06 LAB — ETHANOL: Alcohol, Ethyl (B): <15 mg/dL (ref <15)

## 2024-04-06 LAB — LIPASE, BLOOD: Lipase: 56 U/L — ABNORMAL HIGH (ref 11–51)

## 2024-04-06 MED ORDER — CLOZAPINE 100 MG PO TABS
200.0000 mg | ORAL_TABLET | Freq: Every day | ORAL | Status: DC
  Administered 2024-04-06 – 2024-04-07 (×2): 200 mg via ORAL
  Filled 2024-04-06 (×3): qty 2

## 2024-04-06 MED ORDER — SODIUM CHLORIDE 0.9 % IV SOLN: INTRAVENOUS | Status: DC

## 2024-04-06 MED ORDER — SODIUM CHLORIDE 0.9 % IV BOLUS
1000.0000 mL | Freq: Once | INTRAVENOUS | Status: AC
  Administered 2024-04-06: 1000 mL via INTRAVENOUS

## 2024-04-06 MED ORDER — SODIUM CHLORIDE (HYPERTONIC) 2 % OP SOLN: 1.0000 [drp] | Freq: Four times a day (QID) | OPHTHALMIC | Status: DC

## 2024-04-06 MED ORDER — PANTOPRAZOLE SODIUM 40 MG PO TBEC
40.0000 mg | DELAYED_RELEASE_TABLET | Freq: Two times a day (BID) | ORAL | Status: DC
Start: 2024-04-06 — End: 2024-04-08
  Administered 2024-04-06 – 2024-04-08 (×4): 40 mg via ORAL
  Filled 2024-04-06 (×4): qty 1

## 2024-04-06 MED ORDER — HEPARIN SODIUM (PORCINE) 5000 UNIT/ML IJ SOLN
5000.0000 [IU] | Freq: Three times a day (TID) | INTRAMUSCULAR | Status: DC
  Administered 2024-04-07 – 2024-04-08 (×2): 5000 [IU] via SUBCUTANEOUS
  Filled 2024-04-06 (×4): qty 1

## 2024-04-06 MED ORDER — PROCHLORPERAZINE EDISYLATE 10 MG/2ML IJ SOLN: 10.0000 mg | Freq: Four times a day (QID) | INTRAMUSCULAR | Status: DC | PRN

## 2024-04-06 MED ORDER — SUCRALFATE 1 G PO TABS
1.0000 g | ORAL_TABLET | Freq: Three times a day (TID) | ORAL | Status: DC
Start: 2024-04-06 — End: 2024-04-08
  Administered 2024-04-06 – 2024-04-08 (×7): 1 g via ORAL
  Filled 2024-04-06 (×7): qty 1

## 2024-04-06 MED ORDER — ACETAMINOPHEN 325 MG PO TABS: 650.0000 mg | ORAL_TABLET | Freq: Four times a day (QID) | ORAL | Status: DC | PRN

## 2024-04-06 MED ORDER — FLUVOXAMINE MALEATE 100 MG PO TABS
100.0000 mg | ORAL_TABLET | Freq: Every day | ORAL | Status: DC
  Administered 2024-04-06 – 2024-04-07 (×2): 100 mg via ORAL
  Filled 2024-04-06 (×3): qty 1

## 2024-04-06 MED ORDER — PROPRANOLOL HCL 20 MG PO TABS
20.0000 mg | ORAL_TABLET | Freq: Every day | ORAL | Status: DC
  Administered 2024-04-08: 20 mg via ORAL
  Filled 2024-04-06 (×2): qty 1

## 2024-04-06 MED ORDER — ACETAMINOPHEN 650 MG RE SUPP: 650.0000 mg | Freq: Four times a day (QID) | RECTAL | Status: DC | PRN

## 2024-04-06 NOTE — ED Provider Notes (Signed)
 [place] EMERGENCY DEPARTMENT AT [facility] Provider Note   CSN: [id number] Arrival date & time: 04/06/24  1357     Patient presents with: No chief complaint on file.   Danny Greene is a 63 y.o. male.   HPI : Arrives from [facility] with staff concern for dizziness.  Staff reports patient has a poor p.o. intake for the past few days, patient was up denies complaints, denies focal weakness, denies pain.  EMS reports systolic 90 on arrival patient received 400 mL normal saline in transport     Prior to Admission medications   Medication Sig Start Date End Date Taking? Authorizing Provider  calcium carbonate (CALCIUM 600) 600 MG TABS tablet Take 600 mg by mouth 2 (two) times daily with a meal.    [provider]  Cholecalciferol  (VITAMIN D ) 2000 units CAPS Take 1 capsule by mouth daily.    [provider]  clozapine  (CLOZARIL ) 200 MG tablet Take 200 mg by mouth at bedtime.     [provider]  fluvoxaMINE  (LUVOX ) 100 MG tablet Take 100 mg by mouth at bedtime. sleep    [provider]  gemfibrozil  (LOPID ) 600 MG tablet Take 600 mg by mouth 2 (two) times daily before a meal.    [provider]  metoprolol  tartrate (LOPRESSOR ) 25 MG tablet Take 1 tablet (25 mg total) by mouth daily as needed (Palpitations). 11/01/22 10/27/23  Mallipeddi, Vishnu P, MD  Multiple Vitamins-Minerals (THERA-M) TABS Take 1 tablet by mouth daily. 04/26/22   [provider]  MURO 128 2 % ophthalmic solution Place into both eyes. 04/02/24   [provider]  pantoprazole  (PROTONIX ) 40 MG tablet Take 1 tablet (40 mg total) by mouth 2 (two) times daily. 02/24/21   Johnson, Clanford L, MD  propranolol  (INDERAL ) 20 MG tablet Take 20 mg by mouth daily. 03/29/24   [provider]  sucralfate  (CARAFATE ) 1 g tablet Take 1 tablet (1 g total) by mouth 4 (four) times daily -  with meals and at bedtime. 02/24/21 11/01/22  Vicci Afton CROME, MD  vitamin B-12 (CYANOCOBALAMIN ) 250 MCG tablet Take 250 mcg by mouth daily. 03/27/24   [provider]    Allergies: Patient has no known allergies.    Review of Systems  Updated Vital Signs BP 129/89   Pulse (!) 102   Temp 98.1 F (36.7 C) (Oral)   Resp 17   Ht 5' 11 (1.803 m)   Wt 68.9 kg   SpO2 97%   BMI 21.20 kg/m   Physical Exam Vitals and nursing note reviewed.  Constitutional:      General: He is not in acute distress.    Appearance: He is well-developed. He is ill-appearing. He is not toxic-appearing.  HENT:     Head: Normocephalic and atraumatic.  Eyes:     Conjunctiva/sclera: Conjunctivae normal.  Cardiovascular:     Rate and Rhythm: Regular rhythm. Tachycardia present.  Pulmonary:     Effort: Pulmonary effort is normal. No respiratory distress.     Breath sounds: No stridor.  Abdominal:     General: There is no distension.  Skin:    General: Skin is warm and dry.  Neurological:     Mental Status: He is alert and oriented to person, place, and time.  Psychiatric:        Behavior: Behavior is slowed and withdrawn.     (all labs ordered are listed, but only abnormal results are  displayed) Labs Reviewed  COMPREHENSIVE METABOLIC PANEL WITH GFR - Abnormal; Notable for the following components:      Result Value   CO2 20 (*)    Glucose, Bld 153 (*)    BUN 53 (*)    Creatinine, Ser 3.24 (*)    GFR, Estimated 21 (*)    Anion gap 17 (*)    All other components within normal limits  CBC WITH DIFFERENTIAL/PLATELET - Abnormal; Notable for the following components:   RBC 4.00 (*)    HCT 38.9 (*)    Monocytes Absolute 1.2 (*)    All other components within normal limits  LIPASE, BLOOD - Abnormal; Notable for the following components:   Lipase 56 (*)    All other components within normal limits  ETHANOL  MAGNESIUM     EKG: EKG Interpretation Date/Time:  Monday April 06 2024 14:14:47 EDT Ventricular Rate:  116 PR  Interval:  169 QRS Duration:  78 QT Interval:  319 QTC Calculation: 444 R Axis:   -41  Text Interpretation: Sinus tachycardia Consider left atrial enlargement RSR' in V1 or V2, right VCD or RVH Inferior infarct, old Confirmed by Garrick Charleston [phone]) on 04/06/2024 2:28:48 PM  Radiology: CT Renal Stone Study Result Date: 04/06/2024 CLINICAL DATA:  Abdominal pain, rule out obstruction EXAM: CT ABDOMEN AND PELVIS WITHOUT CONTRAST TECHNIQUE: Multidetector CT imaging of the abdomen and pelvis was performed following the standard protocol without IV contrast. RADIATION DOSE REDUCTION: This exam was performed according to the departmental dose-optimization program which includes automated exposure control, adjustment of the mA and/or kV according to patient size and/or use of iterative reconstruction technique. COMPARISON:  CT abdomen pelvis February 20, 2021 FINDINGS: Limited evaluation due to lack of contrast. Lower chest: Moderate-sized hiatal hernia. Visualized lungs and heart appear unremarkable. No pericardial effusion. Hepatobiliary: No focal liver abnormality is seen. Layering sludge. No gall gallbladder wall thickening, or biliary dilatation. Pancreas: Unremarkable. No pancreatic ductal dilatation or surrounding inflammatory changes. Spleen: Normal in size without focal abnormality. Adrenals/Urinary Tract: Adrenal glands are unremarkable. Punctate nonobstructive nephrolithiasis measuring 2 mm right kidney lower pole. Otherwise kidneys are normal, without focal lesion, or hydronephrosis. Bladder is unremarkable. Stomach/Bowel: Stomach is within normal limits. Appendix appears prominent measuring up to 6.6 mm however no secondary signs of appendicitis or fat stranding identified. Fullness of proximal small bowel loops measuring up to 3 cm without definite transition point or obstructive lesion. There is a left inguinal fat containing hernia measuring about 5.4 cm with retraction of the sigmoid colon towards the  internal orifice of the hernia and some mild fat stranding/inflammatory changes around the few diverticula in this region (2/66) and (5/34, 2/73). Short-segment wall thickening of distal sigmoid colon without definite mass lesion likely due to under distension. Vascular/Lymphatic: No significant vascular findings are present. Subcentimeter mesenteric root lymph nodes. Reproductive: Prostatomegaly. Other: No abdominal wall hernia or abnormality. No abdominopelvic ascites. Musculoskeletal: No acute or significant osseous findings. Degenerative changes of the spine. IMPRESSION: Left inguinal fat containing hernia with retraction of the sigmoid colon toward the hernia internal orifice and some fat stranding. Early diverticulitis cannot be excluded. Prominent and mildly dilated proximal small bowel loops without definite transition point or obstructive lesion. Correlate with clinical findings. Appendix is mildly enlarged without secondary signs of appendicitis. Prostatomegaly.  Correlate with PSA levels. Electronically Signed   By: Megan  Zare M.D.   On: 04/06/2024 19:11     Procedures   Medications Ordered in the ED  sodium chloride   0.9 % bolus 1,000 mL (0 mLs Intravenous Stopped 04/06/24 1520)  sodium chloride  0.9 % bolus 1,000 mL (1,000 mLs Intravenous New Bag/Given 04/06/24 1626)                                    Medical Decision Making Adult male with history of schizophrenia, GERD, presents with concern for weakness from this facility. Patient is not hypotensive, nor febrile, but is tachycardic and tachypneic, concern for dehydration versus infection versus electrolyte abnormalities. Cardiac 115 sinus tach abnormal pulse ox 99%, normal  Amount and/or Complexity of Data Reviewed Independent Historian: EMS External Data Reviewed: notes.    Details: Details from last hospitalization below. Labs: ordered. Decision-making details documented in ED Course. Radiology: ordered and independent  interpretation performed. Decision-making details documented in ED Course. ECG/medicine tests: ordered and independent interpretation performed. Decision-making details documented in ED Course.  Risk Prescription drug management. Decision regarding hospitalization. Diagnosis or treatment significantly limited by social determinants of health.  Patient with acute kidney injury CT scan ordered 7:24 PM CT without evidence for acute obstructive process for his kidneys, patient has received fluid resuscitation 1 L, second liter running.  No evidence for obvious infection and he actually has a soft, nonperitoneal abdomen, CTs possible suggestion of diverticulitis noticed. No concern for acute kidney injury, consideration of medication effect versus dehydration, Particular given the patient's history of schizophrenia, patient required admission for monitoring, management ongoing fluid resuscitation.      Final diagnoses:  AKI (acute kidney injury) [facility])    ED Discharge Orders     None          Garrick Charleston, MD 04/06/24 1925

## 2024-04-06 NOTE — ED Triage Notes (Signed)
 Pt bib [facility] from [facility]. Pt has been dizzy for the last 2-3 days with poor PO intake. Per EMS pt has been hypotensive. Pt was 90 sysytolic w/ EMS. Pt given 400ml NS in route.

## 2024-04-06 NOTE — H&P (Signed)
 History and Physical    Danny Greene FMW:[id number] DOB: [birth year] DOA: 04/06/2024  PCP: Gwenith Shuck, NP   Patient coming from: Group Home   Chief Complaint: Not eating, dizzy   HPI: Danny Greene is a 63 y.o. male with medical history significant for schizophrenia, GERD with esophagitis, and sinus tachycardia who presents with anorexia and lightheadedness.  Patient reports that he vomited 2 or 3 days ago.  He has reportedly not been eating or drinking very much at all since then.  He denies any diarrhea, abdominal pain, groin pain, flank pain, fevers, or chills.  Patient states that he is hungry.  ED Course: Upon arrival to the ED, patient is found to be afebrile and saturating well on room air with mild tachycardia and stable BP.  Labs are most notable for BUN 53 and creatinine 3.24.  There is no hydronephrosis on CT but notation is made of left inguinal hernia with fat stranding, prominent and mildly dilated proximal small bowel loops, and prostatic megaly.  Patient was given 3 L of NS in the ED.  Review of Systems:  All other systems reviewed and apart from HPI, are negative.  Past Medical History:  Diagnosis Date   Constipation    GERD (gastroesophageal reflux disease)    Hepatitis C antibody test positive    HCV RNA negative 11/2015 and negative 01/2018   History of kidney stones    Hypertension    Schizophrenia [facility])     Past Surgical History:  Procedure Laterality Date   BIOPSY  04/22/2017   Procedure: BIOPSY;  Surgeon: Shaaron Lamar HERO, MD;  [facility];  Service: Endoscopy;;  esophagus   BIOPSY  02/22/2021   Procedure: BIOPSY;  Surgeon: Golda Claudis PENNER, MD;  Location: [facility];  Service: Endoscopy;;   CHOLECYSTECTOMY     COLONOSCOPY WITH PROPOFOL  N/A 04/22/2017   Dr. Shaaron: 5 semi-pedunculated polyps found in the sigmoid, splenic, ascending colon measuring 4 to 6 mm in size.  Tubular adenomas.  Next colonoscopy in 3 years.   ESOPHAGEAL  BRUSHING  02/22/2021   Procedure: ESOPHAGEAL BRUSHING;  Surgeon: Golda Claudis PENNER, MD;  Location: [facility];  Service: Endoscopy;;   ESOPHAGEAL DILATION N/A 05/26/2015   Procedure: ESOPHAGEAL DILATION;  Surgeon: Lamar HERO Shaaron, MD;  Location: [facility];  Service: Endoscopy;  Laterality: N/A;   ESOPHAGEAL DILATION  09/21/2015   Procedure: ESOPHAGEAL DILATION;  Surgeon: Lamar HERO Shaaron, MD;  [facility];  Service: Endoscopy;;   ESOPHAGOGASTRODUODENOSCOPY N/A 05/26/2015   RMR: Severe ulcerative esophagitits ad described. Status post biopsy. Hiatal hernia.    ESOPHAGOGASTRODUODENOSCOPY N/A 09/21/2015   RMR: Abnormal distal esophagus query short segment Barretts status post biopsy. Severe esophagitis seen previously has resolved. Hiatal hernia. Status post maloney dilation prior to biopsy. No barrett's   ESOPHAGOGASTRODUODENOSCOPY (EGD) WITH PROPOFOL  N/A 02/01/2017   Dr. Shaaron: LA grade D esophagitis with overlying ribbonlike plaques, KOH few yeast. Marked friability of esophageal mucosa. Recommend EGD in 3 months, twice a day PPI.   ESOPHAGOGASTRODUODENOSCOPY (EGD) WITH PROPOFOL  N/A 04/22/2017   Dr. Shaaron: Medium sized hiatal hernia, distal exudative erosive/ulcerative reflux esophagitis involving the distal 5 cm of the tubular office.  Biopsies negative for Barrett's esophagus.   ESOPHAGOGASTRODUODENOSCOPY (EGD) WITH PROPOFOL  N/A 02/22/2021   Procedure: ESOPHAGOGASTRODUODENOSCOPY (EGD) WITH PROPOFOL ;  Surgeon: Golda Claudis PENNER, MD;  Location[facility];  Service: Endoscopy;  Laterality: N/A;   KIDNEY STONE SURGERY  Social History:   reports that he has quit smoking. His smoking use included cigarettes. He has a 2 pack-year smoking history. He has never used smokeless tobacco. He reports that he does not drink alcohol and does not use drugs.  No Known Allergies  Family History  Problem Relation Age of Onset   Liver disease Neg Hx    Colon cancer Neg Hx      Prior to  Admission medications   Medication Sig Start Date End Date Taking? Authorizing Provider  calcium carbonate (CALCIUM 600) 600 MG TABS tablet Take 600 mg by mouth 2 (two) times daily with a meal.   Yes [provider]  Cholecalciferol  (VITAMIN D ) 2000 units CAPS Take 1 capsule by mouth daily.   Yes [provider]  clozapine  (CLOZARIL ) 200 MG tablet Take 200 mg by mouth at bedtime.   Yes [provider]  fluvoxaMINE  (LUVOX ) 100 MG tablet Take 100 mg by mouth at bedtime. sleep   Yes [provider]  gemfibrozil  (LOPID ) 600 MG tablet Take 600 mg by mouth 2 (two) times daily before a meal.   Yes [provider]  Multiple Vitamins-Minerals (THERA-M) TABS Take 1 tablet by mouth daily. 04/26/22  Yes [provider]  MURO 128 2 % ophthalmic solution Place 1 drop into both eyes in the morning, at noon, in the evening, and at bedtime. 04/02/24  Yes [provider]  Nutritional Supplements (ENSURE ORIGINAL) LIQD Take 8 oz by mouth in the morning and at bedtime. Chocolate   Yes [provider]  pantoprazole  (PROTONIX ) 40 MG tablet Take 1 tablet (40 mg total) by mouth 2 (two) times daily. 02/24/21  Yes Johnson, Clanford L, MD  propranolol  (INDERAL ) 20 MG tablet Take 20 mg by mouth daily. 03/29/24  Yes [provider]  sucralfate  (CARAFATE ) 1 g tablet Take 1 tablet (1 g total) by mouth 4 (four) times daily -  with meals and at bedtime. 02/24/21 04/06/24 Yes Johnson, Clanford L, MD  vitamin B-12 (CYANOCOBALAMIN ) 250 MCG tablet Take 250 mcg by mouth daily. 03/27/24  Yes [provider]    Physical Exam: Vitals:   04/06/24 1515 04/06/24 1730 04/06/24 1745 04/06/24 1830  BP: 104/81 132/82 124/81 129/89  Pulse: (!) 102 (!) 110 99 (!) 102  Resp: 19  18 17   Temp:    98.1 F (36.7 C)  TempSrc:    Oral  SpO2: 98% 97% 97% 97%  Weight:      Height:        Constitutional: NAD, no pallor or diaphoresis   Eyes: PERTLA, lids and  conjunctivae normal ENMT: Mucous membranes are moist. Posterior pharynx clear of any exudate or lesions.   Neck: supple, no masses  Respiratory: no wheezing, no crackles. No accessory muscle use.  Cardiovascular: S1 & S2 heard, regular rate and rhythm. No extremity edema.   Abdomen: No distension, no tenderness, soft. Bowel sounds active.  Musculoskeletal: no clubbing / cyanosis. No joint deformity upper and lower extremities.   Skin: no significant rashes, lesions, ulcers. Warm, dry, well-perfused. Neurologic: CN 2-12 grossly intact. Moving all extremities. Alert and oriented.  Psychiatric: Calm. Cooperative.    Labs and Imaging on Admission: I have personally reviewed following labs and imaging studies  CBC: Recent Labs  Lab 04/06/24 1418  WBC 9.4  NEUTROABS 6.7  HGB 13.5  HCT 38.9*  MCV 97.3  PLT 256   Basic Metabolic Panel: Recent Labs  Lab 04/06/24 1418  NA 136  K 3.6  CL 99  CO2 20*  GLUCOSE 153*  BUN 53*  CREATININE 3.24*  CALCIUM 10.0  MG 1.9   GFR: Estimated Creatinine Clearance: 22.7 mL/min (A) (by C-G formula based on SCr of 3.24 mg/dL (H)). Liver Function Tests: Recent Labs  Lab 04/06/24 1418  AST 22  ALT 17  ALKPHOS 110  BILITOT 0.6  PROT 7.9  ALBUMIN 3.9   Recent Labs  Lab 04/06/24 1418  LIPASE 56*   No results for input(s): AMMONIA in the last 168 hours. Coagulation Profile: No results for input(s): INR, PROTIME in the last 168 hours. Cardiac Enzymes: No results for input(s): CKTOTAL, CKMB, CKMBINDEX, TROPONINI in the last 168 hours. BNP (last 3 results) No results for input(s): PROBNP in the last 8760 hours. HbA1C: No results for input(s): HGBA1C in the last 72 hours. CBG: No results for input(s): GLUCAP in the last 168 hours. Lipid Profile: No results for input(s): CHOL, HDL, LDLCALC, TRIG, CHOLHDL, LDLDIRECT in the last 72 hours. Thyroid Function Tests: No results for input(s): TSH, T4TOTAL,  FREET4, T3FREE, THYROIDAB in the last 72 hours. Anemia Panel: No results for input(s): VITAMINB12, FOLATE, FERRITIN, TIBC, IRON, RETICCTPCT in the last 72 hours. Urine analysis:    Component Value Date/Time   COLORURINE YELLOW 02/20/2021 1955   APPEARANCEUR HAZY (A) 02/20/2021 1955   APPEARANCEUR Clear 12/24/2014 1603   LABSPEC 1.019 02/20/2021 1955   LABSPEC 1.011 12/24/2014 1603   PHURINE 5.0 02/20/2021 1955   GLUCOSEU 50 (A) 02/20/2021 1955   GLUCOSEU Negative 12/24/2014 1603   HGBUR SMALL (A) 02/20/2021 1955   BILIRUBINUR NEGATIVE 02/20/2021 1955   BILIRUBINUR Negative 12/24/2014 1603   KETONESUR NEGATIVE 02/20/2021 1955   PROTEINUR 30 (A) 02/20/2021 1955   UROBILINOGEN 0.2 06/09/2015 1540   NITRITE NEGATIVE 02/20/2021 1955   LEUKOCYTESUR NEGATIVE 02/20/2021 1955   LEUKOCYTESUR Negative 12/24/2014 1603   Sepsis Labs: @LABRCNTIP (procalcitonin:4,lacticidven:4) )No results found for this or any previous visit (from the past 240 hours).   Radiological Exams on Admission: CT Renal Stone Study Result Date: 04/06/2024 CLINICAL DATA:  Abdominal pain, rule out obstruction EXAM: CT ABDOMEN AND PELVIS WITHOUT CONTRAST TECHNIQUE: Multidetector CT imaging of the abdomen and pelvis was performed following the standard protocol without IV contrast. RADIATION DOSE REDUCTION: This exam was performed according to the departmental dose-optimization program which includes automated exposure control, adjustment of the mA and/or kV according to patient size and/or use of iterative reconstruction technique. COMPARISON:  CT abdomen pelvis February 20, 2021 FINDINGS: Limited evaluation due to lack of contrast. Lower chest: Moderate-sized hiatal hernia. Visualized lungs and heart appear unremarkable. No pericardial effusion. Hepatobiliary: No focal liver abnormality is seen. Layering sludge. No gall gallbladder wall thickening, or biliary dilatation. Pancreas: Unremarkable. No pancreatic ductal  dilatation or surrounding inflammatory changes. Spleen: Normal in size without focal abnormality. Adrenals/Urinary Tract: Adrenal glands are unremarkable. Punctate nonobstructive nephrolithiasis measuring 2 mm right kidney lower pole. Otherwise kidneys are normal, without focal lesion, or hydronephrosis. Bladder is unremarkable. Stomach/Bowel: Stomach is within normal limits. Appendix appears prominent measuring up to 6.6 mm however no secondary signs of appendicitis or fat stranding identified. Fullness of proximal small bowel loops measuring up to 3 cm without definite transition point or obstructive lesion. There is a left inguinal fat containing hernia measuring about 5.4 cm with retraction of the sigmoid colon towards the internal orifice of the hernia and some mild fat stranding/inflammatory changes around the few diverticula in this region (2/66) and (5/34, 2/73). Short-segment wall  thickening of distal sigmoid colon without definite mass lesion likely due to under distension. Vascular/Lymphatic: No significant vascular findings are present. Subcentimeter mesenteric root lymph nodes. Reproductive: Prostatomegaly. Other: No abdominal wall hernia or abnormality. No abdominopelvic ascites. Musculoskeletal: No acute or significant osseous findings. Degenerative changes of the spine. IMPRESSION: Left inguinal fat containing hernia with retraction of the sigmoid colon toward the hernia internal orifice and some fat stranding. Early diverticulitis cannot be excluded. Prominent and mildly dilated proximal small bowel loops without definite transition point or obstructive lesion. Correlate with clinical findings. Appendix is mildly enlarged without secondary signs of appendicitis. Prostatomegaly.  Correlate with PSA levels. Electronically Signed   By: Megan  Zare M.D.   On: 04/06/2024 19:11    EKG: Independently reviewed. Sinus tachycardia.   Assessment/Plan   1. AKI  - No hydronephrosis on CT, likely prerenal  in setting of recent N/V and anorexia  - Continue IVF hydration, renally-dose medications, repeat chem panel in am   2. GERD with esophagitis  - Continue PPI and Carafate    3. Palpitations  - Propranolol  as tolerated    4. Loss of appetite  - Patient states he is hungry in ED, denies any abdominal discomfort, and has benign exam   - CT read noted though it does not correlate clinically     DVT prophylaxis: Sq heparin   Code Status: Full  Level of Care: Level of care: Med-Surg Family Communication: None present   Disposition Plan:  Patient is from: Group home  Anticipated d/c is to: Group home  Anticipated d/c date is: 04/09/24  Patient currently: Pending improved renal function, tolerance of adequate oral intake  Consults called: None   Admission status: Inpatient     Evalene GORMAN Sprinkles, MD [facility]  04/06/2024, 7:58 PM

## 2024-04-07 ENCOUNTER — Encounter (HOSPITAL_COMMUNITY): Payer: Self-pay | Admitting: Family Medicine

## 2024-04-07 DIAGNOSIS — F209 Schizophrenia, unspecified: Secondary | ICD-10-CM | POA: Diagnosis not present

## 2024-04-07 DIAGNOSIS — K2100 Gastro-esophageal reflux disease with esophagitis, without bleeding: Secondary | ICD-10-CM | POA: Diagnosis not present

## 2024-04-07 DIAGNOSIS — N179 Acute kidney failure, unspecified: Secondary | ICD-10-CM | POA: Diagnosis not present

## 2024-04-07 LAB — HIV ANTIBODY (ROUTINE TESTING W REFLEX): HIV Screen 4th Generation wRfx: NONREACTIVE

## 2024-04-07 LAB — CBC
HCT: 32.6 % — ABNORMAL LOW (ref 39.0–52.0)
Hemoglobin: 10.8 g/dL — ABNORMAL LOW (ref 13.0–17.0)
MCH: 33.6 pg (ref 26.0–34.0)
MCHC: 33.1 g/dL (ref 30.0–36.0)
MCV: 101.6 fL — ABNORMAL HIGH (ref 80.0–100.0)
Platelets: 219 K/uL (ref 150–400)
RBC: 3.21 MIL/uL — ABNORMAL LOW (ref 4.22–5.81)
RDW: 12.5 % (ref 11.5–15.5)
WBC: 8.2 K/uL (ref 4.0–10.5)
nRBC: 0 % (ref 0.0–0.2)

## 2024-04-07 LAB — FERRITIN: Ferritin: 65 ng/mL (ref 24–336)

## 2024-04-07 LAB — IRON AND TIBC
Iron: 72 ug/dL (ref 45–182)
Saturation Ratios: 19 % (ref 17.9–39.5)
TIBC: 390 ug/dL (ref 250–450)
UIBC: 318 ug/dL

## 2024-04-07 LAB — SODIUM, URINE, RANDOM: Sodium, Ur: 46 mmol/L

## 2024-04-07 LAB — BASIC METABOLIC PANEL WITH GFR
Anion gap: 9 (ref 5–15)
BUN: 44 mg/dL — ABNORMAL HIGH (ref 8–23)
CO2: 22 mmol/L (ref 22–32)
Calcium: 9 mg/dL (ref 8.9–10.3)
Chloride: 107 mmol/L (ref 98–111)
Creatinine, Ser: 1.7 mg/dL — ABNORMAL HIGH (ref 0.61–1.24)
GFR, Estimated: 45 mL/min — ABNORMAL LOW (ref >60)
Glucose, Bld: 107 mg/dL — ABNORMAL HIGH (ref 70–99)
Potassium: 3.9 mmol/L (ref 3.5–5.1)
Sodium: 138 mmol/L (ref 135–145)

## 2024-04-07 LAB — RETICULOCYTES
Immature Retic Fract: 16.5 % — ABNORMAL HIGH (ref 2.3–15.9)
RBC.: 3.18 MIL/uL — ABNORMAL LOW (ref 4.22–5.81)
Retic Count, Absolute: 69.6 K/uL (ref 19.0–186.0)
Retic Ct Pct: 2.2 % (ref 0.4–3.1)

## 2024-04-07 LAB — FOLATE: Folate: 16.3 ng/mL (ref >5.9)

## 2024-04-07 LAB — VITAMIN B12: Vitamin B-12: 288 pg/mL (ref 180–914)

## 2024-04-07 LAB — MAGNESIUM: Magnesium: 1.7 mg/dL (ref 1.7–2.4)

## 2024-04-07 LAB — CREATININE, URINE, RANDOM: Creatinine, Urine: 189 mg/dL

## 2024-04-07 LAB — PHOSPHORUS: Phosphorus: 2.3 mg/dL — ABNORMAL LOW (ref 2.5–4.6)

## 2024-04-07 MED ORDER — SODIUM CHLORIDE 0.9 % IV SOLN: INTRAVENOUS | Status: AC

## 2024-04-07 MED ORDER — MAGNESIUM SULFATE 2 GM/50ML IV SOLN
2.0000 g | Freq: Once | INTRAVENOUS | Status: AC
  Administered 2024-04-07: 2 g via INTRAVENOUS
  Filled 2024-04-07: qty 50

## 2024-04-07 NOTE — Progress Notes (Addendum)
 PROGRESS NOTE   Danny Greene  FMW:[id number] DOB: [birth year] DOA: 04/06/2024 PCP: Gwenith Shuck, NP   No chief complaint on file.  Level of care: Med-Surg  Brief Admission History:  63 y.o. male with medical history significant for schizophrenia, GERD with esophagitis, and sinus tachycardia who presents with anorexia and lightheadedness.   Patient reports that he vomited 2 or 3 days ago.  He has reportedly not been eating or drinking very much at all since then.  He denies any diarrhea, abdominal pain, groin pain, flank pain, fevers, or chills.  Patient states that he is hungry.   ED Course: Upon arrival to the ED, patient is found to be afebrile and saturating well on room air with mild tachycardia and stable BP.  Labs are most notable for BUN 53 and creatinine 3.24.  There is no hydronephrosis on CT but notation is made of left inguinal hernia with fat stranding, prominent and mildly dilated proximal small bowel loops, and prostatic enlargement.  He was admitted for AKI and dehydration.     Assessment and Plan:  AKI (Prerenal) -- he reports a couple of days of vomiting and poor oral intake -- he is responding favorably to IV fluid hydration and creatinine is improving -- continue IV fluids and encourage oral intake -- recheck labs in AM -- could not find a recent baseline creatinine but in 2022 it was normal  Loss of appetite -- this may have been associated with AKI but it is resolving now -- he says he is hungry and wants to eat -- encouraged fluids and solid food consumption -- he is getting better with supportive measures  Palpitations -- he takes daily propanolol for this -- we had to hold it on 8/5 due to soft BPs -- no current complaints of palpitations -- hopefully can resume tomorrow  Soft BPs -- secondary to dehydration and poor oral intake -- hopefully will improve with hydration and better food consumption -- continue to follow per unit protocol    GERD -- resumed PPI and carafate  therapy  Schizophrenia -- resumed home behavioral health medications  Hypomagnesemia -- IV replacement given, recheck in AM   Hypertriglyceridemia -- temporarily holding gemfibrozil  until he recovers from acute illness and has better oral intake  DVT prophylaxis: sq heparin  Code Status: Full  Communication:  Disposition: anticipate return to group home in 1-2 days   Consultants:   Procedures:   Antimicrobials:    Subjective: Pt reports he was able to eat breakfast without difficulty.   Objective: Vitals:   04/06/24 2121 04/07/24 0109 04/07/24 0600 04/07/24 0913  BP: 112/84 100/67 94/67 (!) 93/58  Pulse: 94 94 92 91  Resp: 20 19 20    Temp: (!) 97.5 F (36.4 C) 98 F (36.7 C) 98.1 F (36.7 C)   TempSrc: Oral Oral Oral   SpO2: 100% 97% 95%   Weight:      Height:        Intake/Output Summary (Last 24 hours) at 04/07/2024 1144 Last data filed at 04/07/2024 1113 Gross per 24 hour  Intake 1998 ml  Output 225 ml  Net 1773 ml   Filed Weights   04/06/24 1406  Weight: 68.9 kg   Examination:  General exam: Appears calm and comfortable  Respiratory system: Clear to auscultation. Respiratory effort normal. Cardiovascular system: normal S1 & S2 heard. No JVD, murmurs, rubs, gallops or clicks. No pedal edema. Gastrointestinal system: Abdomen is nondistended, soft and nontender. No organomegaly or masses felt. Normal bowel  sounds heard. Central nervous system: Alert and oriented. No focal neurological deficits. Extremities: Symmetric 5 x 5 power. Skin: No rashes, lesions or ulcers. Psychiatry: Judgement and insight appear normal. Mood & affect appropriate.   Data Reviewed: I have personally reviewed following labs and imaging studies  CBC: Recent Labs  Lab 04/06/24 1418 04/07/24 0447  WBC 9.4 8.2  NEUTROABS 6.7  --   HGB 13.5 10.8*  HCT 38.9* 32.6*  MCV 97.3 101.6*  PLT 256 219    Basic Metabolic Panel: Recent Labs  Lab  04/06/24 1418 04/07/24 0447  NA 136 138  K 3.6 3.9  CL 99 107  CO2 20* 22  GLUCOSE 153* 107*  BUN 53* 44*  CREATININE 3.24* 1.70*  CALCIUM 10.0 9.0  MG 1.9 1.7  PHOS  --  2.3*    CBG: No results for input(s): GLUCAP in the last 168 hours.  No results found for this or any previous visit (from the past 240 hours).   Radiology Studies: CT Renal Stone Study Result Date: 04/06/2024 CLINICAL DATA:  Abdominal pain, rule out obstruction EXAM: CT ABDOMEN AND PELVIS WITHOUT CONTRAST TECHNIQUE: Multidetector CT imaging of the abdomen and pelvis was performed following the standard protocol without IV contrast. RADIATION DOSE REDUCTION: This exam was performed according to the departmental dose-optimization program which includes automated exposure control, adjustment of the mA and/or kV according to patient size and/or use of iterative reconstruction technique. COMPARISON:  CT abdomen pelvis February 20, 2021 FINDINGS: Limited evaluation due to lack of contrast. Lower chest: Moderate-sized hiatal hernia. Visualized lungs and heart appear unremarkable. No pericardial effusion. Hepatobiliary: No focal liver abnormality is seen. Layering sludge. No gall gallbladder wall thickening, or biliary dilatation. Pancreas: Unremarkable. No pancreatic ductal dilatation or surrounding inflammatory changes. Spleen: Normal in size without focal abnormality. Adrenals/Urinary Tract: Adrenal glands are unremarkable. Punctate nonobstructive nephrolithiasis measuring 2 mm right kidney lower pole. Otherwise kidneys are normal, without focal lesion, or hydronephrosis. Bladder is unremarkable. Stomach/Bowel: Stomach is within normal limits. Appendix appears prominent measuring up to 6.6 mm however no secondary signs of appendicitis or fat stranding identified. Fullness of proximal small bowel loops measuring up to 3 cm without definite transition point or obstructive lesion. There is a left inguinal fat containing hernia measuring  about 5.4 cm with retraction of the sigmoid colon towards the internal orifice of the hernia and some mild fat stranding/inflammatory changes around the few diverticula in this region (2/66) and (5/34, 2/73). Short-segment wall thickening of distal sigmoid colon without definite mass lesion likely due to under distension. Vascular/Lymphatic: No significant vascular findings are present. Subcentimeter mesenteric root lymph nodes. Reproductive: Prostatomegaly. Other: No abdominal wall hernia or abnormality. No abdominopelvic ascites. Musculoskeletal: No acute or significant osseous findings. Degenerative changes of the spine. IMPRESSION: Left inguinal fat containing hernia with retraction of the sigmoid colon toward the hernia internal orifice and some fat stranding. Early diverticulitis cannot be excluded. Prominent and mildly dilated proximal small bowel loops without definite transition point or obstructive lesion. Correlate with clinical findings. Appendix is mildly enlarged without secondary signs of appendicitis. Prostatomegaly.  Correlate with PSA levels. Electronically Signed   By: Megan  Zare M.D.   On: 04/06/2024 19:11    Scheduled Meds:  clozapine   200 mg Oral QHS   fluvoxaMINE   100 mg Oral QHS   heparin   5,000 Units Subcutaneous Q8H   pantoprazole   40 mg Oral BID   propranolol   20 mg Oral Daily   sodium chloride   1  drop Both Eyes QID   sucralfate   1 g Oral TID WC & HS   Continuous Infusions:  sodium chloride       LOS: 1 day   Time spent: 55 mins  Brandon Scarbrough Vicci, MD How to contact the [facility] Attending or Consulting provider 7A - 7P or covering provider during after hours 7P -7A, for this patient?  Check the care team in [facility] and look for a) attending/consulting TRH provider listed and b) the TRH team listed Log into [email or web address] to find provider on call.  Locate the TRH provider you are looking for under [facility] and page to a number that you can be directly reached. If you  still have difficulty reaching the provider, please page the [facility] (Director on Call) for the Hospitalists listed on amion for assistance.  04/07/2024, 11:44 AM

## 2024-04-07 NOTE — TOC Initial Note (Signed)
 Transition of Care [facility] - Initial/Assessment Note    Patient Details  Name: Danny Greene MRN: [id number] Date of Birth: Sep 20, 1960  [facility]) CM/SW Contact:    Sharlyne Stabs, RN Phone Number: 04/07/2024, 10:25 AM  Clinical Narrative:      Patient admitted with Acute Kidney injury. Patient is from [facility] care home. CM spoke with staff. They have a difficult time getting patient to eat and drink, He will drink ensure, but any other foods or drinks they have to ready push to get him to partake. Team update, Rn will educate. They do need and FL2 and will provide transportation at discharge.  TOC following FL2 started.              Expected Discharge Plan: Home/Self Care     Patient Goals and CMS Choice Patient states their goals for this hospitalization and ongoing recovery are:: Return to [facility] care CMS Medicare.gov Compare Post Acute Care list provided to:: Patient Represenative (must comment) Choice offered to / list presented to :  (Staff at [facility] care)      Expected Discharge Plan and Services       Living arrangements for the past 2 months: Group Home                  Prior Living Arrangements/Services Living arrangements for the past 2 months: Group Home Lives with:: Facility Resident   Do you feel safe going back to the place where you live?: Yes            Activities of Daily Living   ADL Screening (condition at time of admission) Independently performs ADLs?: Yes (appropriate for developmental age) Is the patient deaf or have difficulty hearing?: No Does the patient have difficulty seeing, even when wearing glasses/contacts?: No Does the patient have difficulty concentrating, remembering, or making decisions?: Yes  Permission Sought/Granted                  Emotional Assessment     Affect (typically observed): Accepting Orientation: : Oriented to Self, Oriented to Place, Oriented to Situation Alcohol / Substance  Use: Not Applicable Psych Involvement: No (comment)  Admission diagnosis:  AKI (acute kidney injury) (HCC) [N17.9] Patient Active Problem List   Diagnosis Date Noted   Protein-calorie malnutrition, severe 02/22/2021   Leukocytosis 02/21/2021   Lactic acidosis 02/21/2021   Hyperglycemia 02/21/2021   Prolonged QT interval 02/21/2021   SIRS (systemic inflammatory response syndrome) (HCC) 02/21/2021   Hypomagnesemia    Intractable nausea and vomiting 02/20/2021   History of colonic polyps 07/19/2017   Anemia 07/19/2017   Abnormal LFTs 07/19/2017   Positive colorectal cancer screening using Cologuard test 03/21/2017   Candida esophagitis (HCC) 02/01/2017   AKI (acute kidney injury) (HCC) 02/01/2017   Nausea & vomiting 01/28/2017   Colon cancer screening 10/30/2016   Hepatitis C antibody test positive 11/01/2015    Class: History of   GERD (gastroesophageal reflux disease)    Dysphagia    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   Intractable vomiting with nausea 06/09/2015   Dehydration 06/09/2015   Periumbilical abdominal pain    Esophagitis 05/26/2015   Schizophrenia, unspecified type (HCC) 05/26/2015   Tobacco abuse 05/26/2015   Abnormal CT scan, esophagus    Hiatal hernia    Reflux esophagitis    Acute esophagitis    Abdominal pain 05/25/2015   Hypokalemia 05/25/2015   PCP:  Gwenith Shuck, NP Pharmacy:   [facility] [place], [place] -  [address] [place] [address] Phone: [phone] Fax: [phone]     Social Drivers of Health (SDOH) Social History: SDOH Screenings   Food Insecurity: Food Insecurity Present (04/06/2024)  Housing: Low Risk  (04/06/2024)  Transportation Needs: No Transportation Needs (04/06/2024)  Utilities: Not At Risk (04/06/2024)  Tobacco Use: Medium Risk (04/06/2024)   SDOH Interventions:     Readmission Risk Interventions    04/07/2024   10:23 AM  Readmission Risk Prevention Plan  Transportation Screening Complete  PCP or  Specialist Appt within 5-7 Days Not Complete  Home Care Screening Complete  Medication Review (RN CM) Complete

## 2024-04-07 NOTE — Hospital Course (Signed)
 63 y.o. male with medical history significant for schizophrenia, GERD with esophagitis, and sinus tachycardia who presents with anorexia and lightheadedness.   Patient reports that he vomited 2 or 3 days ago.  He has reportedly not been eating or drinking very much at all since then.  He denies any diarrhea, abdominal pain, groin pain, flank pain, fevers, or chills.  Patient states that he is hungry.   ED Course: Upon arrival to the ED, patient is found to be afebrile and saturating well on room air with mild tachycardia and stable BP.  Labs are most notable for BUN 53 and creatinine 3.24.  There is no hydronephrosis on CT but notation is made of left inguinal hernia with fat stranding, prominent and mildly dilated proximal small bowel loops, and prostatic enlargement.  He was admitted for AKI and dehydration.

## 2024-04-07 NOTE — Plan of Care (Signed)
   Problem: Education: Goal: Knowledge of General Education information will improve Description Including pain rating scale, medication(s)/side effects and non-pharmacologic comfort measures Outcome: Progressing   Problem: Health Behavior/Discharge Planning: Goal: Ability to manage health-related needs will improve Outcome: Progressing

## 2024-04-07 NOTE — Progress Notes (Signed)
 Mobility Specialist Progress Note:    04/07/24 1145  Mobility  Activity Ambulated with assistance;Pivoted/transferred from bed to chair  Level of Assistance Contact guard assist, steadying assist  Assistive Device None  Range of Motion/Exercises Active;All extremities  Activity Response Tolerated well  Mobility Referral Yes  Mobility visit 1 Mobility  Mobility Specialist Start Time (ACUTE ONLY) 1125  Mobility Specialist Stop Time (ACUTE ONLY) 1138  Mobility Specialist Time Calculation (min) (ACUTE ONLY) 13 min   Pt received in bed, agreeable to mobility. Required CGA to stand and ambulate with no AD. Tolerated well, had LOB while standing bedside to change clothes. Left pt with NT, all needs met.   Sherrilee Ditty Mobility Specialist Please contact via Special educational needs teacher or  Rehab office at [phone]

## 2024-04-08 DIAGNOSIS — R627 Adult failure to thrive: Secondary | ICD-10-CM

## 2024-04-08 DIAGNOSIS — F209 Schizophrenia, unspecified: Secondary | ICD-10-CM | POA: Diagnosis not present

## 2024-04-08 DIAGNOSIS — N179 Acute kidney failure, unspecified: Secondary | ICD-10-CM | POA: Diagnosis not present

## 2024-04-08 LAB — CBC
HCT: 29.3 % — ABNORMAL LOW (ref 39.0–52.0)
Hemoglobin: 9.7 g/dL — ABNORMAL LOW (ref 13.0–17.0)
MCH: 33.9 pg (ref 26.0–34.0)
MCHC: 33.1 g/dL (ref 30.0–36.0)
MCV: 102.4 fL — ABNORMAL HIGH (ref 80.0–100.0)
Platelets: 173 K/uL (ref 150–400)
RBC: 2.86 MIL/uL — ABNORMAL LOW (ref 4.22–5.81)
RDW: 12.6 % (ref 11.5–15.5)
WBC: 5.1 K/uL (ref 4.0–10.5)
nRBC: 0 % (ref 0.0–0.2)

## 2024-04-08 LAB — BASIC METABOLIC PANEL WITH GFR
Anion gap: 8 (ref 5–15)
BUN: 24 mg/dL — ABNORMAL HIGH (ref 8–23)
CO2: 21 mmol/L — ABNORMAL LOW (ref 22–32)
Calcium: 8.2 mg/dL — ABNORMAL LOW (ref 8.9–10.3)
Chloride: 112 mmol/L — ABNORMAL HIGH (ref 98–111)
Creatinine, Ser: 0.8 mg/dL (ref 0.61–1.24)
GFR, Estimated: >60 mL/min (ref >60)
Glucose, Bld: 87 mg/dL (ref 70–99)
Potassium: 3.9 mmol/L (ref 3.5–5.1)
Sodium: 141 mmol/L (ref 135–145)

## 2024-04-08 LAB — MAGNESIUM: Magnesium: 2 mg/dL (ref 1.7–2.4)

## 2024-04-08 MED ORDER — PROPRANOLOL HCL 10 MG PO TABS: 10.0000 mg | ORAL_TABLET | Freq: Every day | ORAL | 1 refills | Status: AC

## 2024-04-08 MED ORDER — PROPRANOLOL HCL 20 MG PO TABS: 10.0000 mg | ORAL_TABLET | Freq: Every day | ORAL | Status: DC

## 2024-04-08 NOTE — TOC Transition Note (Signed)
 Transition of Care [facility]) - Discharge Note   Patient Details  Name: Danny Greene MRN: [id number] Date of Birth: 1961/04/04  [facility]) CM/SW Contact:  Sharlyne Stabs, RN Phone Number: 04/08/2024, 1:49 PM   Clinical Narrative:   Patient discharging back to [facility] care home. CM spoke with [name]. FL2 completed and copy in DC packet. RW ordered and [employer] will deliver to the room . No accepting agency for his insurance for home health.  [facility] will pick up patient around 4pm, RN updated.     Final next level of care: Home/Self Care Barriers to Discharge: No Home Care Agency will accept this patient   Patient Goals and CMS Choice Patient states their goals for this hospitalization and ongoing recovery are:: Return to [facility] care CMS Medicare.gov Compare Post Acute Care list provided to:: Patient Represenative (must comment) Choice offered to / list presented to :  (Staff at [facility] care)      Discharge Placement                Patient to be transferred to facility by: [name] Name of family member notified: [name] Patient and family notified of of transfer: 04/08/24  Discharge Plan and Services Additional resources added to the After Visit Summary for          Social Drivers of Health (SDOH) Interventions SDOH Screenings   Food Insecurity: Food Insecurity Present (04/06/2024)  Housing: Low Risk  (04/06/2024)  Transportation Needs: No Transportation Needs (04/06/2024)  Utilities: Not At Risk (04/06/2024)  Tobacco Use: Medium Risk (04/06/2024)    Readmission Risk Interventions    04/07/2024   10:23 AM  Readmission Risk Prevention Plan  Transportation Screening Complete  PCP or Specialist Appt within 5-7 Days Not Complete  Home Care Screening Complete  Medication Review (RN CM) Complete

## 2024-04-08 NOTE — Discharge Summary (Signed)
 Physician Discharge Summary   Patient: Danny Greene MRN: [id number] DOB: 1961-05-01  Admit date:     04/06/2024  Discharge date: 04/08/24  Discharge Physician: Alm Malory Spurr   PCP: Gwenith Shuck, NP   Recommendations at discharge:   Please follow up with primary care provider within 1-2 weeks  Please repeat BMP and CBC in one week     Hospital Course: 63 y.o. male with medical history significant for schizophrenia, GERD with esophagitis, and sinus tachycardia who presents with anorexia and lightheadedness.   Patient reports that he vomited 2 or 3 days ago.  He has reportedly not been eating or drinking very much at all since then.  He denies any diarrhea, abdominal pain, groin pain, flank pain, fevers, or chills.  Patient states that he is hungry.   ED Course: Upon arrival to the ED, patient is found to be afebrile and saturating well on room air with mild tachycardia and stable BP.  Labs are most notable for BUN 53 and creatinine 3.24.  There is no hydronephrosis on CT but notation is made of left inguinal hernia with fat stranding, prominent and mildly dilated proximal small bowel loops, and prostatic enlargement.  He was admitted for AKI and dehydration.    Assessment and Plan:  AKI -- he reports a couple of days of vomiting and poor oral intake - baseline creatinine 0.5-0.8 - serum creatinine peaked 3.04 -- he is responding favorably to IV fluid hydration and creatinine is improving>>serum creatinine 0.80 on day of dc -- continue IV fluids and encourage oral intake   Failure to Thrive -- this may have been associated with AKI but it is resolving now -- he says he is hungry and wants to eat -- encouraged fluids and solid food consumption -- he is getting better with supportive measures - eating 75-100% of meals at time of dc - B12 288, folate 16.3, iron sat 19%   Palpitations -- he takes daily propanolol for this -- we had to hold it on 8/5 due to soft BPs -- no current  complaints of palpitations -- hopefully can resume tomorrow   Soft BPs -- secondary to dehydration and poor oral intake -- hopefully will improve with hydration and better food consumption -- decrease propranolol  to 10 mg daily   GERD -- resumed PPI and carafate  therapy   Schizophrenia -- resumed home behavioral health medications   Hypomagnesemia -- IV replacement given>>improved   Hypertriglyceridemia -- temporarily holding gemfibrozil  until he recovers from acute illness and has better oral intake - restart at time of dc     Consultants: none Procedures performed: none  Disposition: Home Diet recommendation:  Regular diet DISCHARGE MEDICATION: Allergies as of 04/08/2024   No Known Allergies      Medication List     TAKE these medications    Calcium 600 600 MG Tabs tablet Generic drug: calcium carbonate Take 600 mg by mouth 2 (two) times daily with a meal.   clozapine  200 MG tablet Commonly known as: CLOZARIL  Take 200 mg by mouth at bedtime.   Ensure Original Liqd Take 8 oz by mouth in the morning and at bedtime. Chocolate   fluvoxaMINE  100 MG tablet Commonly known as: LUVOX  Take 100 mg by mouth at bedtime. sleep   gemfibrozil  600 MG tablet Commonly known as: LOPID  Take 600 mg by mouth 2 (two) times daily before a meal.   Muro 128 2 % ophthalmic solution Generic drug: sodium chloride  Place 1 drop into both eyes  in the morning, at noon, in the evening, and at bedtime.   pantoprazole  40 MG tablet Commonly known as: PROTONIX  Take 1 tablet (40 mg total) by mouth 2 (two) times daily.   propranolol  10 MG tablet Commonly known as: INDERAL  Take 1 tablet (10 mg total) by mouth daily. Start taking on: April 09, 2024 What changed:  medication strength how much to take   sucralfate  1 g tablet Commonly known as: Carafate  Take 1 tablet (1 g total) by mouth 4 (four) times daily -  with meals and at bedtime.   Thera-M Tabs Take 1 tablet by mouth daily.    vitamin B-12 250 MCG tablet Commonly known as: CYANOCOBALAMIN  Take 250 mcg by mouth daily.   Vitamin D  50 MCG (2000 UT) Caps Take 1 capsule by mouth daily.        Discharge Exam: Filed Weights   04/06/24 1406  Weight: 68.9 kg   HEENT:  [place]/AT, No thrush, no icterus CV:  RRR, no rub, no S3, no S4 Lung:  CTA, no wheeze, no rhonchi Abd:  soft/+BS, NT Ext:  No edema, no lymphangitis, no synovitis, no rash   Condition at discharge: stable  The results of significant diagnostics from this hospitalization (including imaging, microbiology, ancillary and laboratory) are listed below for reference.   Imaging Studies: CT Renal Stone Study Result Date: 04/06/2024 CLINICAL DATA:  Abdominal pain, rule out obstruction EXAM: CT ABDOMEN AND PELVIS WITHOUT CONTRAST TECHNIQUE: Multidetector CT imaging of the abdomen and pelvis was performed following the standard protocol without IV contrast. RADIATION DOSE REDUCTION: This exam was performed according to the departmental dose-optimization program which includes automated exposure control, adjustment of the mA and/or kV according to patient size and/or use of iterative reconstruction technique. COMPARISON:  CT abdomen pelvis February 20, 2021 FINDINGS: Limited evaluation due to lack of contrast. Lower chest: Moderate-sized hiatal hernia. Visualized lungs and heart appear unremarkable. No pericardial effusion. Hepatobiliary: No focal liver abnormality is seen. Layering sludge. No gall gallbladder wall thickening, or biliary dilatation. Pancreas: Unremarkable. No pancreatic ductal dilatation or surrounding inflammatory changes. Spleen: Normal in size without focal abnormality. Adrenals/Urinary Tract: Adrenal glands are unremarkable. Punctate nonobstructive nephrolithiasis measuring 2 mm right kidney lower pole. Otherwise kidneys are normal, without focal lesion, or hydronephrosis. Bladder is unremarkable. Stomach/Bowel: Stomach is within normal limits. Appendix  appears prominent measuring up to 6.6 mm however no secondary signs of appendicitis or fat stranding identified. Fullness of proximal small bowel loops measuring up to 3 cm without definite transition point or obstructive lesion. There is a left inguinal fat containing hernia measuring about 5.4 cm with retraction of the sigmoid colon towards the internal orifice of the hernia and some mild fat stranding/inflammatory changes around the few diverticula in this region (2/66) and (5/34, 2/73). Short-segment wall thickening of distal sigmoid colon without definite mass lesion likely due to under distension. Vascular/Lymphatic: No significant vascular findings are present. Subcentimeter mesenteric root lymph nodes. Reproductive: Prostatomegaly. Other: No abdominal wall hernia or abnormality. No abdominopelvic ascites. Musculoskeletal: No acute or significant osseous findings. Degenerative changes of the spine. IMPRESSION: Left inguinal fat containing hernia with retraction of the sigmoid colon toward the hernia internal orifice and some fat stranding. Early diverticulitis cannot be excluded. Prominent and mildly dilated proximal small bowel loops without definite transition point or obstructive lesion. Correlate with clinical findings. Appendix is mildly enlarged without secondary signs of appendicitis. Prostatomegaly.  Correlate with PSA levels. Electronically Signed   By: Megan  Zare M.D.  On: 04/06/2024 19:11    Microbiology: Results for orders placed or performed during the hospital encounter of 02/20/21  Resp Panel by RT-PCR (Flu A&B, Covid) Nasopharyngeal Swab     Status: None   Collection Time: 02/20/21  8:30 PM   Specimen: Nasopharyngeal Swab; Nasopharyngeal(NP) swabs in vial transport medium  Result Value Ref Range Status   SARS Coronavirus 2 by RT PCR NEGATIVE NEGATIVE Final    Comment: (NOTE) SARS-CoV-2 target nucleic acids are NOT DETECTED.  The SARS-CoV-2 RNA is generally detectable in upper  respiratory specimens during the acute phase of infection. The lowest concentration of SARS-CoV-2 viral copies this assay can detect is 138 copies/mL. A negative result does not preclude SARS-Cov-2 infection and should not be used as the sole basis for treatment or other patient management decisions. A negative result may occur with  improper specimen collection/handling, submission of specimen other than nasopharyngeal swab, presence of viral mutation(s) within the areas targeted by this assay, and inadequate number of viral copies(<138 copies/mL). A negative result must be combined with clinical observations, patient history, and epidemiological information. The expected result is Negative.  Fact Sheet for Patients:  BloggerCourse.com  Fact Sheet for Healthcare Providers:  SeriousBroker.it  This test is no t yet approved or cleared by the [place]  FDA and  has been authorized for detection and/or diagnosis of SARS-CoV-2 by FDA under an Emergency Use Authorization (EUA). This EUA will remain  in effect (meaning this test can be used) for the duration of the COVID-19 declaration under Section 564(b)(1) of the Act, 21 U.S.C.section 360bbb-3(b)(1), unless the authorization is terminated  or revoked sooner.       Influenza A by PCR NEGATIVE NEGATIVE Final   Influenza B by PCR NEGATIVE NEGATIVE Final    Comment: (NOTE) The Xpert Xpress SARS-CoV-2/FLU/RSV plus assay is intended as an aid in the diagnosis of influenza from Nasopharyngeal swab specimens and should not be used as a sole basis for treatment. Nasal washings and aspirates are unacceptable for Xpert Xpress SARS-CoV-2/FLU/RSV testing.  Fact Sheet for Patients: BloggerCourse.com  Fact Sheet for Healthcare Providers: SeriousBroker.it  This test is not yet approved or cleared by the [place]  FDA and has been  authorized for detection and/or diagnosis of SARS-CoV-2 by FDA under an Emergency Use Authorization (EUA). This EUA will remain in effect (meaning this test can be used) for the duration of the COVID-19 declaration under Section 564(b)(1) of the Act, 21 U.S.C. section 360bbb-3(b)(1), unless the authorization is terminated or revoked.  Performed at [facility], [address], [place], [place] [address]   Culture, blood (routine x 2)     Status: None   Collection Time: 02/20/21 10:15 PM   Specimen: BLOOD  Result Value Ref Range Status   Specimen Description BLOOD LEFT ANTECUBITAL  Final   Special Requests   Final    Blood Culture adequate volume BOTTLES DRAWN AEROBIC AND ANAEROBIC   Culture   Final    NO GROWTH 5 DAYS Performed at [facility], [address] [place], [place] [address]    Report Status 02/25/2021 FINAL  Final  Culture, blood (routine x 2)     Status: None   Collection Time: 02/20/21 10:21 PM   Specimen: BLOOD  Result Value Ref Range Status   Specimen Description BLOOD BLOOD LEFT HAND  Final   Special Requests   Final    Blood Culture adequate volume BOTTLES DRAWN AEROBIC AND ANAEROBIC   Culture   Final  NO GROWTH 5 DAYS Performed at [facility], [address], [place], [place] [address]    Report Status 02/25/2021 FINAL  Final  KOH prep     Status: None   Collection Time: 02/22/21  2:51 PM   Specimen: PATH GI Other  Result Value Ref Range Status   Specimen Description ESOPHAGUS  Final   Special Requests NONE  Final   KOH Prep   Final    NO YEAST OR FUNGAL ELEMENTS SEEN Performed at [facility], [address], [place], [place] [address]    Report Status 02/22/2021 FINAL  Final    Labs: CBC: Recent Labs  Lab 04/06/24 1418 04/07/24 0447 04/08/24 0431  WBC 9.4 8.2 5.1  NEUTROABS 6.7  --   --   HGB 13.5 10.8* 9.7*  HCT 38.9* 32.6* 29.3*  MCV 97.3 101.6* 102.4*  PLT 256 219 173   Basic Metabolic Panel: Recent Labs  Lab 04/06/24 1418  04/07/24 0447 04/08/24 0431  NA 136 138 141  K 3.6 3.9 3.9  CL 99 107 112*  CO2 20* 22 21*  GLUCOSE 153* 107* 87  BUN 53* 44* 24*  CREATININE 3.24* 1.70* 0.80  CALCIUM 10.0 9.0 8.2*  MG 1.9 1.7 2.0  PHOS  --  2.3*  --    Liver Function Tests: Recent Labs  Lab 04/06/24 1418  AST 22  ALT 17  ALKPHOS 110  BILITOT 0.6  PROT 7.9  ALBUMIN 3.9   CBG: No results for input(s): GLUCAP in the last 168 hours.  Discharge time spent: greater than 30 minutes.  Signed: Alm Schneider, MD [facility] 04/08/2024

## 2024-04-08 NOTE — NC FL2 (Signed)
 [place]  MEDICAID [id number] LEVEL OF CARE FORM     IDENTIFICATION  Patient Name: Danny Greene Birthdate: 03/10/1961 Sex: male Admission Date (Current Location): 04/06/2024  [facility] and [place] Number:  [employer] and Address:  [facility],  [address], [place] [address]      Provider Number: [id number]  Attending Physician Name and Address:  Evonnie Alm, MD  Relative Name and Phone Number:  Yvone Mallard Pricilla)  [phone]    Current Level of Care: Hospital Recommended Level of Care: Family Care Home Prior Approval Number:    Date Approved/Denied:   PASRR Number:    Discharge Plan: Domiciliary (Rest home)    Current Diagnoses: Patient Active Problem List   Diagnosis Date Noted   Failure to thrive in adult 04/08/2024   Protein-calorie malnutrition, severe 02/22/2021   Leukocytosis 02/21/2021   Lactic acidosis 02/21/2021   Hyperglycemia 02/21/2021   Prolonged QT interval 02/21/2021   SIRS (systemic inflammatory response syndrome) (HCC) 02/21/2021   Hypomagnesemia    Intractable nausea and vomiting 02/20/2021   History of colonic polyps 07/19/2017   Anemia 07/19/2017   Abnormal LFTs 07/19/2017   Positive colorectal cancer screening using Cologuard test 03/21/2017   Candida esophagitis (HCC) 02/01/2017   AKI (acute kidney injury) (HCC) 02/01/2017   Nausea & vomiting 01/28/2017   Colon cancer screening 10/30/2016   Hepatitis C antibody test positive 11/01/2015   GERD (gastroesophageal reflux disease)    Dysphagia    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   Intractable vomiting with nausea 06/09/2015   Dehydration 06/09/2015   Periumbilical abdominal pain    Esophagitis 05/26/2015   Schizophrenia, unspecified type (HCC) 05/26/2015   Tobacco abuse 05/26/2015   Abnormal CT scan, esophagus    Hiatal hernia    Reflux esophagitis    Acute esophagitis    Abdominal pain 05/25/2015   Hypokalemia 05/25/2015    Orientation RESPIRATION  BLADDER Height & Weight     Self, Situation, Place, Time  Normal Continent Weight: 68.9 kg Height:  5' 11 (180.3 cm)  BEHAVIORAL SYMPTOMS/MOOD NEUROLOGICAL BOWEL NUTRITION STATUS      Continent Diet (Regular)  AMBULATORY STATUS COMMUNICATION OF NEEDS Skin   Limited Assist   Normal                       Personal Care Assistance Level of Assistance  Bathing, Feeding, Dressing Bathing Assistance: Limited assistance Feeding assistance: Limited assistance Dressing Assistance: Limited assistance     Functional Limitations Info  Sight, Hearing, Speech Sight Info: Impaired Hearing Info: Adequate Speech Info: Adequate    SPECIAL CARE FACTORS FREQUENCY  PT (By licensed PT)     PT Frequency: 2 times a week - Home health PT              Contractures Contractures Info: Not present    Additional Factors Info  Code Status, Allergies Code Status Info: FULL Allergies Info: NKDA           Current Medications (04/08/2024):  This is the current hospital active medication list Current Facility-Administered Medications  Medication Dose Route Frequency Provider Last Rate Last Admin   acetaminophen  (TYLENOL ) tablet 650 mg  650 mg Oral Q6H PRN Opyd, Timothy S, MD       Or   acetaminophen  (TYLENOL ) suppository 650 mg  650 mg Rectal Q6H PRN Opyd, Timothy S, MD       cloZAPine  (CLOZARIL ) tablet 200 mg  200 mg Oral QHS Opyd, Evalene  S, MD   200 mg at 04/07/24 2203   fluvoxaMINE  (LUVOX ) tablet 100 mg  100 mg Oral QHS Opyd, Timothy S, MD   100 mg at 04/07/24 2203   heparin  injection 5,000 Units  5,000 Units Subcutaneous Q8H Opyd, Timothy S, MD   5,000 Units at 04/08/24 1213   pantoprazole  (PROTONIX ) EC tablet 40 mg  40 mg Oral BID Opyd, Timothy S, MD   40 mg at 04/08/24 0856   prochlorperazine  (COMPAZINE ) injection 10 mg  10 mg Intravenous Q6H PRN Opyd, Timothy S, MD       [START ON 04/09/2024] propranolol  (INDERAL ) tablet 10 mg  10 mg Oral Daily Tat, David, MD       sucralfate   (CARAFATE ) tablet 1 g  1 g Oral TID WC & HS Opyd, Timothy S, MD   1 g at 04/08/24 1213     Discharge Medications: Allergies as of 04/08/2024   No Known Allergies      Medication List     TAKE these medications    Calcium 600 600 MG Tabs tablet Generic drug: calcium carbonate Take 600 mg by mouth 2 (two) times daily with a meal.   clozapine  200 MG tablet Commonly known as: CLOZARIL  Take 200 mg by mouth at bedtime.   Ensure Original Liqd Take 8 oz by mouth in the morning and at bedtime. Chocolate   fluvoxaMINE  100 MG tablet Commonly known as: LUVOX  Take 100 mg by mouth at bedtime. sleep   gemfibrozil  600 MG tablet Commonly known as: LOPID  Take 600 mg by mouth 2 (two) times daily before a meal.   Muro 128 2 % ophthalmic solution Generic drug: sodium chloride  Place 1 drop into both eyes in the morning, at noon, in the evening, and at bedtime.   pantoprazole  40 MG tablet Commonly known as: PROTONIX  Take 1 tablet (40 mg total) by mouth 2 (two) times daily.   propranolol  10 MG tablet Commonly known as: INDERAL  Take 1 tablet (10 mg total) by mouth daily. Start taking on: April 09, 2024 What changed:  medication strength how much to take   sucralfate  1 g tablet Commonly known as: Carafate  Take 1 tablet (1 g total) by mouth 4 (four) times daily -  with meals and at bedtime.   Thera-M Tabs Take 1 tablet by mouth daily.   vitamin B-12 250 MCG tablet Commonly known as: CYANOCOBALAMIN  Take 250 mcg by mouth daily.   Vitamin D  50 MCG (2000 UT) Caps Take 1 capsule by mouth daily.       Relevant Imaging Results:  Relevant Lab Results:   Additional Information SS# [id number]  Sharlyne Stabs, RN

## 2024-04-08 NOTE — Evaluation (Signed)
 Physical Therapy Evaluation Patient Details Name: Danny Greene MRN: [id number] DOB: [birth year] Today's Date: 04/08/2024  History of Present Illness  Adhrit Krenz is a 63 y.o. male with medical history significant for schizophrenia, GERD with esophagitis, and sinus tachycardia who presents with anorexia and lightheadedness.  Clinical Impression  Patient demonstrates independence with bed mobility, functional transfers, and modified independence with ambulation. Pt demonstrates decreased LE strength and impaired balance however is functional with RW during ambulation today. Patient also demonstrates WFL gait speed, slight disturbance noted on 360 degree turn in hallway but pt able to self correct with RW. Patient also demonstrates ability to STS with no UE support. Patient requires education on importance of use of AD when feeling unsteady and importance of getting appropriate amount of nutrition. Patient discharged from acute physical therapy to care of nursing for ambulation daily as tolerated for length of stay and will be able to receive all other therapy needs at next venue of care.          If plan is discharge home, recommend the following: A little help with walking and/or transfers;A little help with bathing/dressing/bathroom;Assistance with cooking/housework;Assistance with feeding;Assist for transportation   Can travel by private vehicle        Equipment Recommendations Rolling walker (2 wheels)  Recommendations for Other Services       Functional Status Assessment Patient has had a recent decline in their functional status and demonstrates the ability to make significant improvements in function in a reasonable and predictable amount of time.     Precautions / Restrictions Precautions Precautions: Fall Recall of Precautions/Restrictions: Intact Restrictions Weight Bearing Restrictions Per Provider Order: No      Mobility  Bed Mobility Overal bed mobility:  Independent                  Transfers Overall transfer level: Independent                      Ambulation/Gait Ambulation/Gait assistance: Modified independent (Device/Increase time) Gait Distance (Feet): 40 Feet Assistive device: Rolling walker (2 wheels) Gait Pattern/deviations: WFL(Within Functional Limits), Wide base of support, Staggering right Gait velocity: WFL     General Gait Details: one bout of staggering to the right during left 360 degree turn, pt able to self correct with AD, CGA  Stairs            Wheelchair Mobility     Tilt Bed    Modified Rankin (Stroke Patients Only)       Balance Overall balance assessment: Modified Independent                                           Pertinent Vitals/Pain Pain Assessment Pain Assessment: No/denies pain    Home Living Family/patient expects to be discharged to:: Group home Living Arrangements: Group Home             Home Layout: One level Home Equipment: Grab bars - toilet;Grab bars - tub/shower      Prior Function Prior Level of Function : Needs assist             Mobility Comments: pt does not drive and lives at a group home ADLs Comments: pt lives at group home, able to bath himself, assist with food etc     Extremity/Trunk Assessment   Upper Extremity Assessment Upper  Extremity Assessment: Overall WFL for tasks assessed    Lower Extremity Assessment Lower Extremity Assessment: Generalized weakness;Overall [facility] for tasks assessed    Cervical / Trunk Assessment Cervical / Trunk Assessment: Normal  Communication   Communication Communication: No apparent difficulties    Cognition Arousal: Alert Behavior During Therapy: WFL for tasks assessed/performed, Flat affect   PT - Cognitive impairments: No apparent impairments                       PT - Cognition Comments: pt alert and oriented Following commands: Intact       Cueing  Cueing Techniques: Verbal cues, Visual cues     General Comments      Exercises     Assessment/Plan    PT Assessment All further PT needs can be met in the next venue of care  PT Problem List Decreased balance       PT Treatment Interventions      PT Goals (Current goals can be found in the Care Plan section)  Acute Rehab PT Goals Patient Stated Goal: to return to group home PT Goal Formulation: With patient Time For Goal Achievement: 04/08/24 Potential to Achieve Goals: Good    Frequency       Co-evaluation               AM-PAC PT 6 Clicks Mobility  Outcome Measure Help needed turning from your back to your side while in a flat bed without using bedrails?: None Help needed moving from lying on your back to sitting on the side of a flat bed without using bedrails?: None Help needed moving to and from a bed to a chair (including a wheelchair)?: None Help needed standing up from a chair using your arms (e.g., wheelchair or bedside chair)?: None Help needed to walk in hospital room?: A Little Help needed climbing 3-5 steps with a railing? : A Lot 6 Click Score: 21    End of Session   Activity Tolerance: Patient tolerated treatment well;No increased pain Patient left: in chair;with call bell/phone within reach Nurse Communication: Mobility status PT Visit Diagnosis: Unsteadiness on feet (R26.81);History of falling (Z91.81)    Time: 8987-8965 PT Time Calculation (min) (ACUTE ONLY): 22 min   Charges:   PT Evaluation $PT Eval Low Complexity: 1 Low PT Treatments $Therapeutic Activity: 8-22 mins PT General Charges $$ ACUTE PT VISIT: 1 Visit         Lang Ada, PT, DPT [facility] Office: [phone] 10:59 AM, 04/08/24
